# Patient Record
Sex: Female | Born: 1960 | Race: White | Hispanic: No | Marital: Single | Smoking: Former smoker
Health system: Southern US, Community
[De-identification: ages and names within clinical notes are randomized; demographics above are authoritative.]

## PROBLEM LIST (undated history)

## (undated) DIAGNOSIS — M65311 Trigger thumb, right thumb: Secondary | ICD-10-CM

## (undated) DIAGNOSIS — Z8489 Family history of other specified conditions: Secondary | ICD-10-CM

## (undated) DIAGNOSIS — F329 Major depressive disorder, single episode, unspecified: Secondary | ICD-10-CM

## (undated) DIAGNOSIS — K421 Umbilical hernia with gangrene: Secondary | ICD-10-CM

## (undated) DIAGNOSIS — G4730 Sleep apnea, unspecified: Secondary | ICD-10-CM

## (undated) DIAGNOSIS — I10 Essential (primary) hypertension: Secondary | ICD-10-CM

## (undated) DIAGNOSIS — F419 Anxiety disorder, unspecified: Secondary | ICD-10-CM

## (undated) DIAGNOSIS — K219 Gastro-esophageal reflux disease without esophagitis: Secondary | ICD-10-CM

## (undated) DIAGNOSIS — M65312 Trigger thumb, left thumb: Secondary | ICD-10-CM

## (undated) DIAGNOSIS — G629 Polyneuropathy, unspecified: Secondary | ICD-10-CM

## (undated) DIAGNOSIS — K760 Fatty (change of) liver, not elsewhere classified: Secondary | ICD-10-CM

## (undated) DIAGNOSIS — E119 Type 2 diabetes mellitus without complications: Secondary | ICD-10-CM

## (undated) DIAGNOSIS — F32A Depression, unspecified: Secondary | ICD-10-CM

## (undated) DIAGNOSIS — E7800 Pure hypercholesterolemia, unspecified: Secondary | ICD-10-CM

## (undated) DIAGNOSIS — D649 Anemia, unspecified: Secondary | ICD-10-CM

## (undated) DIAGNOSIS — Z8719 Personal history of other diseases of the digestive system: Secondary | ICD-10-CM

## (undated) HISTORY — PX: COLON SURGERY: SHX602

## (undated) HISTORY — PX: KNEE ARTHROSCOPY: SHX127

## (undated) HISTORY — PX: COLONOSCOPY: SHX174

## (undated) HISTORY — PX: OTHER SURGICAL HISTORY: SHX169

## (undated) HISTORY — PX: HERNIA REPAIR: SHX51

---

## 1998-10-30 ENCOUNTER — Emergency Department (HOSPITAL_COMMUNITY): Admission: EM | Admit: 1998-10-30 | Discharge: 1998-10-30 | Payer: Self-pay | Admitting: Emergency Medicine

## 1998-10-30 ENCOUNTER — Encounter: Payer: Self-pay | Admitting: Emergency Medicine

## 1999-07-03 ENCOUNTER — Encounter: Admission: RE | Admit: 1999-07-03 | Discharge: 1999-07-03 | Payer: Self-pay | Admitting: Family Medicine

## 1999-07-03 ENCOUNTER — Encounter: Payer: Self-pay | Admitting: Family Medicine

## 2000-03-11 ENCOUNTER — Other Ambulatory Visit: Admission: RE | Admit: 2000-03-11 | Discharge: 2000-03-11 | Payer: Self-pay | Admitting: Obstetrics and Gynecology

## 2001-05-29 ENCOUNTER — Encounter: Admission: RE | Admit: 2001-05-29 | Discharge: 2001-05-29 | Payer: Self-pay | Admitting: *Deleted

## 2001-06-02 ENCOUNTER — Encounter: Admission: RE | Admit: 2001-06-02 | Discharge: 2001-06-02 | Payer: Self-pay | Admitting: *Deleted

## 2001-06-05 ENCOUNTER — Encounter: Admission: RE | Admit: 2001-06-05 | Discharge: 2001-06-05 | Payer: Self-pay | Admitting: *Deleted

## 2001-06-10 ENCOUNTER — Encounter: Admission: RE | Admit: 2001-06-10 | Discharge: 2001-06-10 | Payer: Self-pay | Admitting: *Deleted

## 2001-06-17 ENCOUNTER — Encounter: Admission: RE | Admit: 2001-06-17 | Discharge: 2001-06-17 | Payer: Self-pay | Admitting: *Deleted

## 2001-07-01 ENCOUNTER — Encounter: Admission: RE | Admit: 2001-07-01 | Discharge: 2001-07-01 | Payer: Self-pay | Admitting: *Deleted

## 2001-11-13 ENCOUNTER — Encounter: Admission: RE | Admit: 2001-11-13 | Discharge: 2001-11-13 | Payer: Self-pay | Admitting: *Deleted

## 2003-02-23 ENCOUNTER — Other Ambulatory Visit: Admission: RE | Admit: 2003-02-23 | Discharge: 2003-02-23 | Payer: Self-pay | Admitting: Obstetrics and Gynecology

## 2004-07-19 ENCOUNTER — Ambulatory Visit (HOSPITAL_COMMUNITY): Admission: RE | Admit: 2004-07-19 | Discharge: 2004-07-19 | Payer: Self-pay | Admitting: Obstetrics and Gynecology

## 2006-08-01 ENCOUNTER — Encounter: Admission: RE | Admit: 2006-08-01 | Discharge: 2006-08-01 | Payer: Self-pay | Admitting: Obstetrics and Gynecology

## 2009-09-22 ENCOUNTER — Encounter: Admission: RE | Admit: 2009-09-22 | Discharge: 2009-09-22 | Payer: Self-pay | Admitting: Family Medicine

## 2013-01-31 ENCOUNTER — Encounter (HOSPITAL_COMMUNITY): Payer: Self-pay | Admitting: Emergency Medicine

## 2013-01-31 ENCOUNTER — Emergency Department (HOSPITAL_COMMUNITY): Payer: BC Managed Care – PPO | Admitting: Certified Registered Nurse Anesthetist

## 2013-01-31 ENCOUNTER — Encounter (HOSPITAL_COMMUNITY): Admission: EM | Disposition: A | Discharge status: Home Health | Payer: Self-pay | Source: Home / Self Care

## 2013-01-31 ENCOUNTER — Emergency Department (INDEPENDENT_AMBULATORY_CARE_PROVIDER_SITE_OTHER)
Admission: EM | Admit: 2013-01-31 | Discharge: 2013-01-31 | Disposition: A | Discharge status: Nursing Home (Includes ICF) | Payer: BC Managed Care – PPO | Source: Home / Self Care | Attending: Family Medicine | Admitting: Family Medicine

## 2013-01-31 ENCOUNTER — Emergency Department (HOSPITAL_COMMUNITY): Payer: BC Managed Care – PPO

## 2013-01-31 ENCOUNTER — Inpatient Hospital Stay (HOSPITAL_COMMUNITY)
Admission: EM | Admit: 2013-01-31 | Discharge: 2013-02-08 | DRG: 330 | Disposition: A | Discharge status: Home Health | Payer: BC Managed Care – PPO | Attending: Surgery | Admitting: Surgery

## 2013-01-31 ENCOUNTER — Encounter (HOSPITAL_COMMUNITY): Payer: BC Managed Care – PPO | Admitting: Certified Registered Nurse Anesthetist

## 2013-01-31 DIAGNOSIS — E785 Hyperlipidemia, unspecified: Secondary | ICD-10-CM | POA: Diagnosis present

## 2013-01-31 DIAGNOSIS — K420 Umbilical hernia with obstruction, without gangrene: Secondary | ICD-10-CM

## 2013-01-31 DIAGNOSIS — K56609 Unspecified intestinal obstruction, unspecified as to partial versus complete obstruction: Secondary | ICD-10-CM

## 2013-01-31 DIAGNOSIS — R109 Unspecified abdominal pain: Secondary | ICD-10-CM

## 2013-01-31 DIAGNOSIS — T8140XA Infection following a procedure, unspecified, initial encounter: Secondary | ICD-10-CM | POA: Diagnosis not present

## 2013-01-31 DIAGNOSIS — E7800 Pure hypercholesterolemia, unspecified: Secondary | ICD-10-CM | POA: Diagnosis present

## 2013-01-31 DIAGNOSIS — Z6841 Body Mass Index (BMI) 40.0 and over, adult: Secondary | ICD-10-CM

## 2013-01-31 DIAGNOSIS — T8149XA Infection following a procedure, other surgical site, initial encounter: Secondary | ICD-10-CM

## 2013-01-31 DIAGNOSIS — R1110 Vomiting, unspecified: Secondary | ICD-10-CM

## 2013-01-31 DIAGNOSIS — E119 Type 2 diabetes mellitus without complications: Secondary | ICD-10-CM | POA: Diagnosis present

## 2013-01-31 DIAGNOSIS — K421 Umbilical hernia with gangrene: Secondary | ICD-10-CM | POA: Diagnosis present

## 2013-01-31 DIAGNOSIS — E872 Acidosis, unspecified: Secondary | ICD-10-CM | POA: Diagnosis present

## 2013-01-31 DIAGNOSIS — I10 Essential (primary) hypertension: Secondary | ICD-10-CM | POA: Diagnosis present

## 2013-01-31 DIAGNOSIS — D72829 Elevated white blood cell count, unspecified: Secondary | ICD-10-CM

## 2013-01-31 HISTORY — DX: Umbilical hernia with gangrene: K42.1

## 2013-01-31 HISTORY — DX: Type 2 diabetes mellitus without complications: E11.9

## 2013-01-31 HISTORY — PX: BOWEL RESECTION: SHX1257

## 2013-01-31 HISTORY — DX: Pure hypercholesterolemia, unspecified: E78.00

## 2013-01-31 HISTORY — PX: UMBILICAL HERNIA REPAIR: SHX196

## 2013-01-31 HISTORY — DX: Essential (primary) hypertension: I10

## 2013-01-31 LAB — CBC WITH DIFFERENTIAL/PLATELET
Basophils Absolute: 0 K/uL (ref 0.0–0.1)
Basophils Relative: 0 % (ref 0–1)
Eosinophils Absolute: 0.1 10*3/uL (ref 0.0–0.7)
Eosinophils Relative: 1 % (ref 0–5)
HCT: 41 % (ref 36.0–46.0)
Hemoglobin: 13.9 g/dL (ref 12.0–15.0)
Lymphocytes Relative: 10 % — ABNORMAL LOW (ref 12–46)
Lymphs Abs: 1.6 10*3/uL (ref 0.7–4.0)
MCH: 29.8 pg (ref 26.0–34.0)
MCHC: 33.9 g/dL (ref 30.0–36.0)
MCV: 88 fL (ref 78.0–100.0)
Monocytes Absolute: 0.6 10*3/uL (ref 0.1–1.0)
Monocytes Relative: 4 % (ref 3–12)
Neutro Abs: 13.6 K/uL — ABNORMAL HIGH (ref 1.7–7.7)
Neutrophils Relative %: 85 % — ABNORMAL HIGH (ref 43–77)
Platelets: 398 K/uL (ref 150–400)
RBC: 4.66 MIL/uL (ref 3.87–5.11)
RDW: 13.4 % (ref 11.5–15.5)
WBC: 16 K/uL — ABNORMAL HIGH (ref 4.0–10.5)

## 2013-01-31 LAB — URINALYSIS, ROUTINE W REFLEX MICROSCOPIC
Glucose, UA: 100 mg/dL — AB
Hgb urine dipstick: NEGATIVE
Ketones, ur: 15 mg/dL — AB
Leukocytes, UA: NEGATIVE
Nitrite: NEGATIVE
Protein, ur: NEGATIVE mg/dL
Specific Gravity, Urine: 1.044 — ABNORMAL HIGH (ref 1.005–1.030)
Urobilinogen, UA: 0.2 mg/dL (ref 0.0–1.0)
pH: 5 (ref 5.0–8.0)

## 2013-01-31 LAB — CG4 I-STAT (LACTIC ACID): Lactic Acid, Venous: 2.95 mmol/L — ABNORMAL HIGH (ref 0.5–2.2)

## 2013-01-31 LAB — COMPREHENSIVE METABOLIC PANEL WITH GFR
ALT: 22 U/L (ref 0–35)
AST: 20 U/L (ref 0–37)
Albumin: 4 g/dL (ref 3.5–5.2)
Alkaline Phosphatase: 49 U/L (ref 39–117)
Chloride: 96 meq/L (ref 96–112)
Creatinine, Ser: 0.59 mg/dL (ref 0.50–1.10)
Potassium: 4 meq/L (ref 3.5–5.1)
Sodium: 134 meq/L — ABNORMAL LOW (ref 135–145)
Total Bilirubin: 0.6 mg/dL (ref 0.3–1.2)

## 2013-01-31 LAB — COMPREHENSIVE METABOLIC PANEL
BUN: 10 mg/dL (ref 6–23)
CO2: 25 mEq/L (ref 19–32)
Calcium: 9.7 mg/dL (ref 8.4–10.5)
GFR calc Af Amer: >90 mL/min (ref >90)
GFR calc non Af Amer: >90 mL/min (ref >90)
Glucose, Bld: 202 mg/dL — ABNORMAL HIGH (ref 70–99)
Total Protein: 8.4 g/dL — ABNORMAL HIGH (ref 6.0–8.3)

## 2013-01-31 LAB — LIPASE, BLOOD: Lipase: 17 U/L (ref 11–59)

## 2013-01-31 SURGERY — REPAIR, HERNIA, UMBILICAL, ADULT
Anesthesia: General | Site: Abdomen | Wound class: Clean Contaminated

## 2013-01-31 MED ORDER — FENTANYL CITRATE 0.05 MG/ML IJ SOLN
INTRAMUSCULAR | Status: DC | PRN
  Administered 2013-01-31 – 2013-02-01 (×7): 50 ug via INTRAVENOUS

## 2013-01-31 MED ORDER — HYDROMORPHONE HCL PF 1 MG/ML IJ SOLN
2.0000 mg | Freq: Once | INTRAMUSCULAR | Status: AC
  Administered 2013-01-31: 2 mg via INTRAMUSCULAR

## 2013-01-31 MED ORDER — IOHEXOL 300 MG/ML  SOLN
20.0000 mL | INTRAMUSCULAR | Status: AC
  Administered 2013-01-31: 25 mL via ORAL
  Administered 2013-01-31: 20 mL via ORAL

## 2013-01-31 MED ORDER — ONDANSETRON 4 MG PO TBDP
ORAL_TABLET | ORAL | Status: AC
  Filled 2013-01-31: qty 1

## 2013-01-31 MED ORDER — CEFAZOLIN SODIUM 1-5 GM-% IV SOLN
INTRAVENOUS | Status: AC
  Filled 2013-01-31: qty 100

## 2013-01-31 MED ORDER — SODIUM CHLORIDE 0.9 % IV BOLUS (SEPSIS)
1000.0000 mL | Freq: Once | INTRAVENOUS | Status: AC
  Administered 2013-01-31: 1000 mL via INTRAVENOUS

## 2013-01-31 MED ORDER — ONDANSETRON 4 MG PO TBDP
4.0000 mg | ORAL_TABLET | Freq: Once | ORAL | Status: AC
  Administered 2013-01-31: 4 mg via ORAL

## 2013-01-31 MED ORDER — HYDROMORPHONE HCL PF 1 MG/ML IJ SOLN
1.0000 mg | Freq: Once | INTRAMUSCULAR | Status: AC
  Administered 2013-01-31: 1 mg via INTRAVENOUS
  Filled 2013-01-31: qty 1

## 2013-01-31 MED ORDER — SODIUM CHLORIDE 0.9 % IV BOLUS (SEPSIS): 1000.0000 mL | Freq: Once | INTRAVENOUS | Status: DC

## 2013-01-31 MED ORDER — HYDROMORPHONE HCL PF 1 MG/ML IJ SOLN
INTRAMUSCULAR | Status: AC
  Filled 2013-01-31: qty 2

## 2013-01-31 MED ORDER — CEFAZOLIN SODIUM 1-5 GM-% IV SOLN
1.0000 g | Freq: Once | INTRAVENOUS | Status: DC
  Filled 2013-01-31: qty 50

## 2013-01-31 MED ORDER — SODIUM CHLORIDE 0.9 % IV SOLN
INTRAVENOUS | Status: DC | PRN
  Administered 2013-01-31: via INTRAVENOUS

## 2013-01-31 MED ORDER — MIDAZOLAM HCL 5 MG/5ML IJ SOLN
INTRAMUSCULAR | Status: DC | PRN
  Administered 2013-01-31: 2 mg via INTRAVENOUS

## 2013-01-31 MED ORDER — IOHEXOL 300 MG/ML  SOLN
100.0000 mL | Freq: Once | INTRAMUSCULAR | Status: AC | PRN
  Administered 2013-01-31: 100 mL via INTRAVENOUS

## 2013-01-31 SURGICAL SUPPLY — 51 items
BLADE SURG 10 STRL SS (BLADE) ×2 IMPLANT
BLADE SURG 15 STRL LF DISP TIS (BLADE) ×1 IMPLANT
BLADE SURG 15 STRL SS (BLADE) ×1
CHLORAPREP W/TINT 26ML (MISCELLANEOUS) ×2 IMPLANT
COTTONBALL LRG STERILE PKG (GAUZE/BANDAGES/DRESSINGS) IMPLANT
COVER SURGICAL LIGHT HANDLE (MISCELLANEOUS) ×2 IMPLANT
DECANTER SPIKE VIAL GLASS SM (MISCELLANEOUS) ×2 IMPLANT
DERMABOND ADVANCED (GAUZE/BANDAGES/DRESSINGS) ×1
DERMABOND ADVANCED .7 DNX12 (GAUZE/BANDAGES/DRESSINGS) ×1 IMPLANT
DRAPE LAPAROSCOPIC ABDOMINAL (DRAPES) ×2 IMPLANT
DRAPE UTILITY 15X26 W/TAPE STR (DRAPE) ×4 IMPLANT
DRSG OPSITE POSTOP 4X6 (GAUZE/BANDAGES/DRESSINGS) ×2 IMPLANT
ELECT CAUTERY BLADE 6.4 (BLADE) ×4 IMPLANT
ELECT REM PT RETURN 9FT ADLT (ELECTROSURGICAL) ×2
ELECTRODE REM PT RTRN 9FT ADLT (ELECTROSURGICAL) ×1 IMPLANT
GLOVE BIO SURGEON STRL SZ7 (GLOVE) ×2 IMPLANT
GLOVE BIO SURGEON STRL SZ7.5 (GLOVE) ×4 IMPLANT
GLOVE BIO SURGEON STRL SZ8 (GLOVE) ×2 IMPLANT
GLOVE BIOGEL PI IND STRL 7.5 (GLOVE) ×2 IMPLANT
GLOVE BIOGEL PI INDICATOR 7.5 (GLOVE) ×2
GOWN STRL NON-REIN LRG LVL3 (GOWN DISPOSABLE) ×6 IMPLANT
KIT BASIN OR (CUSTOM PROCEDURE TRAY) ×2 IMPLANT
KIT ROOM TURNOVER OR (KITS) ×2 IMPLANT
NEEDLE HYPO 25GX1X1/2 BEV (NEEDLE) ×2 IMPLANT
NS IRRIG 1000ML POUR BTL (IV SOLUTION) ×2 IMPLANT
PACK SURGICAL SETUP 50X90 (CUSTOM PROCEDURE TRAY) ×2 IMPLANT
PAD ARMBOARD 7.5X6 YLW CONV (MISCELLANEOUS) ×4 IMPLANT
PENCIL BUTTON HOLSTER BLD 10FT (ELECTRODE) ×2 IMPLANT
RELOAD PROXIMATE 75MM BLUE (ENDOMECHANICALS) ×4 IMPLANT
SPONGE GAUZE 4X4 12PLY (GAUZE/BANDAGES/DRESSINGS) IMPLANT
SPONGE LAP 18X18 X RAY DECT (DISPOSABLE) ×2 IMPLANT
STAPLER GUN LINEAR PROX 60 (STAPLE) ×2 IMPLANT
STAPLER PROXIMATE 75MM BLUE (STAPLE) ×2 IMPLANT
STAPLER VISISTAT 35W (STAPLE) ×2 IMPLANT
SUCTION POOLE TIP (SUCTIONS) ×2 IMPLANT
SUT MNCRL AB 4-0 PS2 18 (SUTURE) ×2 IMPLANT
SUT NOVA NAB DX-16 0-1 5-0 T12 (SUTURE) ×4 IMPLANT
SUT PROLENE 0 CT 1 CR/8 (SUTURE) IMPLANT
SUT SILK 2 0 (SUTURE) ×1
SUT SILK 2 0 SH CR/8 (SUTURE) ×4 IMPLANT
SUT SILK 2-0 18XBRD TIE 12 (SUTURE) ×1 IMPLANT
SUT VIC AB 2-0 SH 27 (SUTURE) ×2
SUT VIC AB 2-0 SH 27X BRD (SUTURE) ×2 IMPLANT
SUT VIC AB 3-0 SH 27 (SUTURE) ×1
SUT VIC AB 3-0 SH 27XBRD (SUTURE) ×1 IMPLANT
SYR BULB 3OZ (MISCELLANEOUS) ×2 IMPLANT
SYR CONTROL 10ML LL (SYRINGE) ×2 IMPLANT
TOWEL OR 17X24 6PK STRL BLUE (TOWEL DISPOSABLE) ×2 IMPLANT
TOWEL OR 17X26 10 PK STRL BLUE (TOWEL DISPOSABLE) ×2 IMPLANT
TUBE CONNECTING 12X1/4 (SUCTIONS) ×2 IMPLANT
YANKAUER SUCT BULB TIP NO VENT (SUCTIONS) ×2 IMPLANT

## 2013-01-31 NOTE — ED Notes (Signed)
Pt  Remains  Npo      Sister  At  Bedside

## 2013-01-31 NOTE — ED Notes (Signed)
Patient returned from CT

## 2013-01-31 NOTE — ED Notes (Signed)
Physician speaking with patient re: test results.

## 2013-01-31 NOTE — ED Notes (Signed)
Patient asked for urine sample, she states she is unable to give sample at this time.

## 2013-01-31 NOTE — ED Notes (Signed)
Pt presents to department for evaluation of abdominal pain and N/V. Onset of symptoms yesterday. Also states several episodes of nausea/vomiting. 5/10 pain upon arrival to ED. Denies urinary symptoms. Pt is alert and oriented x4.

## 2013-01-31 NOTE — ED Notes (Signed)
Pt  Reports   Abdominal  Pain  With  Nausea   /  Vomiting   -  No  Diarrhea   -  Symptoms  Began  Yesterday  Pt   Appears  In pain

## 2013-01-31 NOTE — H&P (Signed)
Lauren Schmidt is an 52 y.o. female.   Chief Complaint: abdominal pain HPI: The pt is a 52 yo wf who began having abdominal pain yesterday. Pain has worsened. She started having nausea and vomiting today. She denies any fever or chill. She came to ER where CT shows an incarcerated umbilical hernia causing a bowel obstruction.  Past Medical History  Diagnosis Date  . Diabetes mellitus without complication   . Hypertension   . Hypercholesterolemia     Past Surgical History  Procedure Laterality Date  . Knee surgery      No family history on file. Social History:  reports that she has never smoked. She does not have any smokeless tobacco history on file. She reports that she does not drink alcohol. Her drug history is not on file.  Allergies: No Known Allergies   (Not in a hospital admission)  Results for orders placed during the hospital encounter of 01/31/13 (from the past 48 hour(s))  CBC WITH DIFFERENTIAL     Status: Abnormal   Collection Time    01/31/13  4:03 PM      Result Value Range   WBC 16.0 (*) 4.0 - 10.5 K/uL   RBC 4.66  3.87 - 5.11 MIL/uL   Hemoglobin 13.9  12.0 - 15.0 g/dL   HCT 16.1  09.6 - 04.5 %   MCV 88.0  78.0 - 100.0 fL   MCH 29.8  26.0 - 34.0 pg   MCHC 33.9  30.0 - 36.0 g/dL   RDW 40.9  81.1 - 91.4 %   Platelets 398  150 - 400 K/uL   Neutrophils Relative % 85 (*) 43 - 77 %   Neutro Abs 13.6 (*) 1.7 - 7.7 K/uL   Lymphocytes Relative 10 (*) 12 - 46 %   Lymphs Abs 1.6  0.7 - 4.0 K/uL   Monocytes Relative 4  3 - 12 %   Monocytes Absolute 0.6  0.1 - 1.0 K/uL   Eosinophils Relative 1  0 - 5 %   Eosinophils Absolute 0.1  0.0 - 0.7 K/uL   Basophils Relative 0  0 - 1 %   Basophils Absolute 0.0  0.0 - 0.1 K/uL  COMPREHENSIVE METABOLIC PANEL     Status: Abnormal   Collection Time    01/31/13  4:03 PM      Result Value Range   Sodium 134 (*) 135 - 145 mEq/L   Potassium 4.0  3.5 - 5.1 mEq/L   Chloride 96  96 - 112 mEq/L   CO2 25  19 - 32 mEq/L   Glucose,  Bld 202 (*) 70 - 99 mg/dL   BUN 10  6 - 23 mg/dL   Creatinine, Ser 7.82  0.50 - 1.10 mg/dL   Calcium 9.7  8.4 - 95.6 mg/dL   Total Protein 8.4 (*) 6.0 - 8.3 g/dL   Albumin 4.0  3.5 - 5.2 g/dL   AST 20  0 - 37 U/L   ALT 22  0 - 35 U/L   Alkaline Phosphatase 49  39 - 117 U/L   Total Bilirubin 0.6  0.3 - 1.2 mg/dL   GFR calc non Af Amer >90  >90 mL/min   GFR calc Af Amer >90  >90 mL/min   Comment: (NOTE)     The eGFR has been calculated using the CKD EPI equation.     This calculation has not been validated in all clinical situations.     eGFR's persistently <90 mL/min  signify possible Chronic Kidney     Disease.  LIPASE, BLOOD     Status: None   Collection Time    01/31/13  4:03 PM      Result Value Range   Lipase 17  11 - 59 U/L  URINALYSIS, ROUTINE W REFLEX MICROSCOPIC     Status: Abnormal   Collection Time    01/31/13  9:40 PM      Result Value Range   Color, Urine AMBER (*) YELLOW   Comment: BIOCHEMICALS MAY BE AFFECTED BY COLOR   APPearance CLEAR  CLEAR   Specific Gravity, Urine 1.044 (*) 1.005 - 1.030   pH 5.0  5.0 - 8.0   Glucose, UA 100 (*) NEGATIVE mg/dL   Hgb urine dipstick NEGATIVE  NEGATIVE   Bilirubin Urine SMALL (*) NEGATIVE   Ketones, ur 15 (*) NEGATIVE mg/dL   Protein, ur NEGATIVE  NEGATIVE mg/dL   Urobilinogen, UA 0.2  0.0 - 1.0 mg/dL   Nitrite NEGATIVE  NEGATIVE   Leukocytes, UA NEGATIVE  NEGATIVE   Comment: MICROSCOPIC NOT DONE ON URINES WITH NEGATIVE PROTEIN, BLOOD, LEUKOCYTES, NITRITE, OR GLUCOSE <1000 mg/dL.  CG4 I-STAT (LACTIC ACID)     Status: Abnormal   Collection Time    01/31/13 10:22 PM      Result Value Range   Lactic Acid, Venous 2.95 (*) 0.5 - 2.2 mmol/L   Ct Abdomen Pelvis W Contrast  01/31/2013   CLINICAL DATA:  Abdominal pain, vomiting.  EXAM: CT ABDOMEN AND PELVIS WITH CONTRAST  TECHNIQUE: Multidetector CT imaging of the abdomen and pelvis was performed using the standard protocol following bolus administration of intravenous contrast.   CONTRAST:  OMNIPAQUE IOHEXOL 300 MG/ML  SOLN  COMPARISON:  None.  FINDINGS: Mild dependent atelectasis in the visualized lung bases. Unremarkable liver, nondilated gallbladder, spleen, adrenal glands, pancreas, left kidney. 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 No hydronephrosis or nephrolithiasis. Unremarkable aorta. Stomach is physiologically distended.  Multiple distended proximal small bowel loops. There is an umbilical hernia containing a loop of small bowel which appears to represent a transition point to decompressed distal small bowel. There is a small amount of extraluminal fluid around the loops in the umbilical hernia suggesting incarceration or strangulation. The colon is nondilated. There is a small amount of pelvic ascites. . No free air. Urinary bladder incompletely distended. Uterus and adnexal regions unremarkable. Portal vein patent. No adenopathy localized. Facet degenerative changes in the lower lumbar spine.  IMPRESSION: 1. Small bowel obstruction secondary to involvement in a small umbilical hernia. 2. Small amount of pelvic ascites.   Electronically Signed   By: Oley Balm M.D.   On: 01/31/2013 21:30    Review of Systems  Constitutional: Negative.   HENT: Negative.   Eyes: Negative.   Respiratory: Negative.   Cardiovascular: Negative.   Gastrointestinal: Positive for nausea, vomiting and abdominal pain.  Genitourinary: Negative.   Musculoskeletal: Negative.   Skin: Negative.   Neurological: Negative.   Endo/Heme/Allergies: Negative.   Psychiatric/Behavioral: Negative.     Blood pressure 142/81, pulse 103, temperature 98.3 F (36.8 C), temperature source Oral, resp. rate 20, SpO2 97.00%. Physical Exam  Constitutional: She is oriented to person, place, and time. She appears well-developed and well-nourished.  [name]  HENT:  Head: Normocephalic and atraumatic.  Eyes: Conjunctivae and EOM are normal.  Pupils are equal, round, and reactive to light.  Neck: Normal range of motion. Neck supple.  Cardiovascular:  Normal rate, regular rhythm and normal heart sounds.   Respiratory: Effort normal and breath sounds normal.  GI: Soft.  nonreducible hernia at umbilicus. Tender to palpation  Musculoskeletal: Normal range of motion.  Neurological: She is alert and oriented to person, place, and time.  Skin: Skin is warm and dry.  Psychiatric: She has a normal mood and affect. Her behavior is normal.     Assessment/Plan The pt has an incarcerated umbilical hernia causing a bowel obstruction. Because of the risk of strangulation of the bowel she will need urgent exploration to fix the hernia and possible bowel resection. I have discussed with her the risks and benefits of surgery as well as some of the technical aspects including the risk of bowel resection and leak and she understands and wishes to proceed.  TOTH III,Emmalou Hunger S 01/31/2013, 10:56 PM

## 2013-01-31 NOTE — ED Provider Notes (Signed)
CSN: [id number]     Arrival date & time 01/31/13  1351 History   First MD Initiated Contact with Patient 01/31/13 1453     Chief Complaint  Patient presents with  . Abdominal Pain   (Consider location/radiation/quality/duration/timing/severity/associated sxs/prior Treatment) Patient is a 52 y.o. female presenting with abdominal pain. The history is provided by the patient.  Abdominal Pain Associated symptoms include abdominal pain. Nothing aggravates the symptoms. Nothing relieves the symptoms. She has tried nothing for the symptoms.  Pt complains of severe left lower abdominal pain  Past Medical History  Diagnosis Date  . Diabetes mellitus without complication   . Hypertension   . Hypercholesterolemia    Past Surgical History  Procedure Laterality Date  . Knee surgery     History reviewed. No pertinent family history. History  Substance Use Topics  . Smoking status: Never Smoker   . Smokeless tobacco: Not on file  . Alcohol Use: No   OB History   Grav Para Term Preterm Abortions TAB SAB Ect Mult Living                 Review of Systems  Gastrointestinal: Positive for abdominal pain.  All other systems reviewed and are negative.    Allergies  Review of patient's allergies indicates not on file.  Home Medications   Current Outpatient Rx  Name  Route  Sig  Dispense  Refill  . ATENOLOL PO   Oral   Take by mouth.         . Fenofibrate (TRICOR PO)   Oral   Take by mouth.         . METFORMIN HCL PO   Oral   Take by mouth.         . omega-3 acid ethyl esters (LOVAZA) 1 G capsule   Oral   Take 2 g by mouth 2 (two) times daily.          BP 150/68  Pulse 92  Temp(Src) 98.5 F (36.9 C) (Oral)  Resp 16  SpO2 99% Physical Exam  Nursing note and vitals reviewed. Constitutional: She is oriented to person, place, and time. She appears well-developed and well-nourished.  HENT:  Head: Normocephalic.  Eyes: EOM are normal.  Neck: Normal range of  motion.  Pulmonary/Chest: Effort normal.  Abdominal: Soft. She exhibits no distension. There is tenderness. There is guarding.  Left lower abdomen  Musculoskeletal: Normal range of motion.  Neurological: She is alert and oriented to person, place, and time.  Psychiatric: She has a normal mood and affect.    ED Course  Procedures (including critical care time) Labs Review Labs Reviewed - No data to display Imaging Review No results found.  EKG Interpretation     Ventricular Rate:    PR Interval:    QRS Duration:   QT Interval:    QTC Calculation:   R Axis:     Text Interpretation:              MDM   1. Abdominal  pain, other specified site    Pt to ED for evaluation    Elson Areas, PA-C 01/31/13 1500

## 2013-01-31 NOTE — Anesthesia Preprocedure Evaluation (Addendum)
Anesthesia Evaluation  Patient identified by MRN, date of birth, ID band Patient awake    Reviewed: Allergy & Precautions, H&P , NPO status , Patient's Chart, lab work & pertinent test results  Airway Mallampati: III TM Distance: <3 FB Neck ROM: Full    Dental  (+) Teeth Intact and Dental Advisory Given   Pulmonary  breath sounds clear to auscultation        Cardiovascular hypertension, Pt. on home beta blockers Rhythm:Regular Rate:Tachycardia     Neuro/Psych    GI/Hepatic Small bowel obstruction   Endo/Other  diabetes, Type 2, Oral Hypoglycemic AgentsMorbid obesity  Renal/GU      Musculoskeletal   Abdominal (+) + obese,  Abdomen: tender.    Peds  Hematology   Anesthesia Other Findings   Reproductive/Obstetrics                         Anesthesia Physical Anesthesia Plan  ASA: III and emergent  Anesthesia Plan: General   Post-op Pain Management:    Induction: Intravenous, Rapid sequence and Cricoid pressure planned  Airway Management Planned: Oral ETT and Video Laryngoscope Planned  Additional Equipment:   Intra-op Plan:   Post-operative Plan: Extubation in OR and Possible Post-op intubation/ventilation  Informed Consent: I have reviewed the patients History and Physical, chart, labs and discussed the procedure including the risks, benefits and alternatives for the proposed anesthesia with the patient or authorized representative who has indicated his/her understanding and acceptance.     Plan Discussed with: CRNA and Surgeon  Anesthesia Plan Comments:         Anesthesia Quick Evaluation

## 2013-01-31 NOTE — ED Provider Notes (Signed)
CSN: [id number]     Arrival date & time 01/31/13  1539 History   First MD Initiated Contact with Patient 01/31/13 1838     Chief Complaint  Patient presents with  . Abdominal Pain  . Emesis    The history is provided by the patient. No language interpreter was used.    Ms. Condron is a 52 y.o. Caucasian female with past medical history of hypertension hyperlipidemia and diabetes who comes emergency department today with abdominal pain. The pain began yesterday afternoon she initially rated at a 5/10 however over the past 24 hours it has become progressively worse.  She currently rates the pain at a 10 out of 10. She's not been able to eat anything for the pain as severe. She denies any distention. Has began vomiting today. With one episode of vomiting after she ate this morning. She initially went to urgent care facility which was his abdominal exam had some significant tenderness and guarding assaulters transferred to the emergency department for further evaluation. She's never had abdominal surgery. She denies fevers, chills, diarrhea, constipation, dysuria, frequency, urgency, or hematuria. She denies vaginal bleeding or vaginal discharge.   Past Medical History  Diagnosis Date  . Diabetes mellitus without complication   . Hypertension   . Hypercholesterolemia    Past Surgical History  Procedure Laterality Date  . Knee surgery     No family history on file. History  Substance Use Topics  . Smoking status: Never Smoker   . Smokeless tobacco: Not on file  . Alcohol Use: No   OB History   Grav Para Term Preterm Abortions TAB SAB Ect Mult Living                 Review of Systems  Constitutional: Negative for fever and chills.  Respiratory: Negative for cough and shortness of breath.   Gastrointestinal: Positive for nausea, vomiting and abdominal pain. Negative for diarrhea, constipation and abdominal distention.  Genitourinary: Negative for dysuria, urgency and frequency.   Musculoskeletal: Negative for back pain, joint swelling and neck pain.  Psychiatric/Behavioral: Negative for confusion.  All other systems reviewed and are negative.    Allergies  Review of patient's allergies indicates no known allergies.  Home Medications   Current Outpatient Rx  Name  Route  Sig  Dispense  Refill  . ATENOLOL PO   Oral   Take by mouth.         . Fenofibrate (TRICOR PO)   Oral   Take by mouth.         Marland Kitchen GLIMEPIRIDE PO   Oral   Take 1 tablet by mouth daily.         Marland Kitchen LISINOPRIL PO   Oral   Take 1 tablet by mouth daily.         Marland Kitchen METFORMIN HCL PO   Oral   Take by mouth.         . omega-3 acid ethyl esters (LOVAZA) 1 G capsule   Oral   Take 2 g by mouth 2 (two) times daily.         Marland Kitchen SIMVASTATIN PO   Oral   Take 1 tablet by mouth daily.          BP 142/81  Pulse 103  Temp(Src) 98.3 F (36.8 C) (Oral)  Resp 20  SpO2 97% Physical Exam  Nursing note and vitals reviewed. Constitutional: She is oriented to person, place, and time. She appears well-developed and well-nourished. No distress.  HENT:  Head: Normocephalic and atraumatic.  Eyes: Pupils are equal, round, and reactive to light.  Neck: Normal range of motion.  Cardiovascular: Normal rate, regular rhythm, normal heart sounds and intact distal pulses.   Pulmonary/Chest: Effort normal. No respiratory distress. She has no wheezes. She exhibits no tenderness.  Abdominal: Soft. Bowel sounds are normal. She exhibits no distension. There is generalized tenderness. There is guarding. There is no rigidity, no rebound, no CVA tenderness, no tenderness at McBurney's point and negative Murphy's sign.  Neurological: She is alert and oriented to person, place, and time. She has normal strength. No cranial nerve deficit or sensory deficit. She exhibits normal muscle tone. Coordination and gait normal.  Skin: Skin is warm and dry.    ED Course  Procedures (including critical care  time) Labs Review Labs Reviewed  URINALYSIS, ROUTINE W REFLEX MICROSCOPIC - Abnormal; Notable for the following:    Color, Urine AMBER (*)    Specific Gravity, Urine 1.044 (*)    Glucose, UA 100 (*)    Bilirubin Urine SMALL (*)    Ketones, ur 15 (*)    All other components within normal limits  CBC WITH DIFFERENTIAL - Abnormal; Notable for the following:    WBC 16.0 (*)    Neutrophils Relative % 85 (*)    Neutro Abs 13.6 (*)    Lymphocytes Relative 10 (*)    All other components within normal limits  COMPREHENSIVE METABOLIC PANEL - Abnormal; Notable for the following:    Sodium 134 (*)    Glucose, Bld 202 (*)    Total Protein 8.4 (*)    All other components within normal limits  CG4 I-STAT (LACTIC ACID) - Abnormal; Notable for the following:    Lactic Acid, Venous 2.95 (*)    All other components within normal limits  LIPASE, BLOOD   Imaging Review Ct Abdomen Pelvis W Contrast  01/31/2013   CLINICAL DATA:  Abdominal pain, vomiting.  EXAM: CT ABDOMEN AND PELVIS WITH CONTRAST  TECHNIQUE: Multidetector CT imaging of the abdomen and pelvis was performed using the standard protocol following bolus administration of intravenous contrast.  CONTRAST:  OMNIPAQUE IOHEXOL 300 MG/ML  SOLN  COMPARISON:  None.  FINDINGS: Mild dependent atelectasis in the visualized lung bases. Unremarkable liver, nondilated gallbladder, spleen, adrenal glands, pancreas, left kidney. 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 No hydronephrosis or nephrolithiasis. Unremarkable aorta. Stomach is physiologically distended.  Multiple distended proximal small bowel loops. There is an umbilical hernia containing a loop of small bowel which appears to represent a transition point to decompressed distal small bowel. There is a small amount of extraluminal fluid around the loops in the umbilical hernia suggesting incarceration or strangulation. The colon is nondilated.  There is a small amount of pelvic ascites. . No free air. Urinary bladder incompletely distended. Uterus and adnexal regions unremarkable. Portal vein patent. No adenopathy localized. Facet degenerative changes in the lower lumbar spine.  IMPRESSION: 1. Small bowel obstruction secondary to involvement in a small umbilical hernia. 2. Small amount of pelvic ascites.   Electronically Signed   By: Oley Balm M.D.   On: 01/31/2013 21:30    EKG Interpretation   None       MDM   Ms. Skufca is a 52 year old Caucasian female who comes emergency department today with abdominal pain and vomiting. Physical exam as above. Initial workup included a urinalysis, CBC, CMP, lipase, and CT of the abdomen and  pelvis with contrast. She was initially treated with a milligram of Dilaudid for pain relief and a liter of normal saline. CBC had an elevated WBC of 16 was unremarkable. CMP sodium 134 glucose of 22 otherwise unremarkable. Lipase is 17.  As result doubt pancreatitis or acute cholecystitis.  UA was negative as result doubt urinary tract infection. CT of the abdomen and pelvis demonstrated a small bowel obstruction with an incarcerated umbilical hernia. The umbilical hernia was attempted to reduce to the emergency department however this is unsuccessful. An i-STAT lactic acid was obtained. An NG tube was placed. Surgery was counseled. Ms. Rivard was admitted to the surgical service in stable condition. Labs imaging review by myself and considered and medical decision-making. Imaging was interpreted by radiology. Care was discussed with my attending Dr. Oletta Lamas.  1. Incarcerated umbilical hernia   2. Small bowel obstruction   3. Abdominal pain   4. Vomiting   5. Lactic acidosis   6. Leukocytosis      Bethann Berkshire, MD 01/31/13 2320

## 2013-01-31 NOTE — ED Provider Notes (Signed)
Medical screening examination/treatment/procedure(s) were performed by a resident physician or non-physician practitioner and as the supervising physician I was immediately available for consultation/collaboration.  Alezandra Egli, MD    Sherwin Hollingshed S Amreen Raczkowski, MD 01/31/13 1913 

## 2013-01-31 NOTE — ED Notes (Signed)
Report to OR. Pt to surgery with EMT.  Sister followed pt to surgery waiting room.

## 2013-02-01 ENCOUNTER — Encounter (HOSPITAL_COMMUNITY): Payer: Self-pay | Admitting: *Deleted

## 2013-02-01 DIAGNOSIS — K55059 Acute (reversible) ischemia of intestine, part and extent unspecified: Secondary | ICD-10-CM

## 2013-02-01 LAB — BASIC METABOLIC PANEL
BUN: 11 mg/dL (ref 6–23)
Calcium: 8.1 mg/dL — ABNORMAL LOW (ref 8.4–10.5)
Chloride: 101 mEq/L (ref 96–112)
Creatinine, Ser: 0.62 mg/dL (ref 0.50–1.10)
GFR calc Af Amer: >90 mL/min (ref >90)
GFR calc non Af Amer: >90 mL/min (ref >90)
Glucose, Bld: 220 mg/dL — ABNORMAL HIGH (ref 70–99)

## 2013-02-01 LAB — CBC
HCT: 35.9 % — ABNORMAL LOW (ref 36.0–46.0)
MCH: 29.8 pg (ref 26.0–34.0)
MCHC: 33.4 g/dL (ref 30.0–36.0)
MCV: 89.1 fL (ref 78.0–100.0)
Platelets: 332 10*3/uL (ref 150–400)
RDW: 13.7 % (ref 11.5–15.5)
WBC: 11.6 10*3/uL — ABNORMAL HIGH (ref 4.0–10.5)

## 2013-02-01 LAB — GLUCOSE, CAPILLARY
Glucose-Capillary: 187 mg/dL — ABNORMAL HIGH (ref 70–99)
Glucose-Capillary: 206 mg/dL — ABNORMAL HIGH (ref 70–99)

## 2013-02-01 LAB — HEMOGLOBIN A1C: Hgb A1c MFr Bld: 6.2 % — ABNORMAL HIGH (ref <5.7)

## 2013-02-01 MED ORDER — OXYCODONE HCL 5 MG PO TABS: 5.0000 mg | ORAL_TABLET | Freq: Once | ORAL | Status: DC | PRN

## 2013-02-01 MED ORDER — CEFAZOLIN SODIUM-DEXTROSE 2-3 GM-% IV SOLR
INTRAVENOUS | Status: DC | PRN
  Administered 2013-01-31: 2 g via INTRAVENOUS

## 2013-02-01 MED ORDER — PIPERACILLIN-TAZOBACTAM 3.375 G IVPB
3.3750 g | Freq: Three times a day (TID) | INTRAVENOUS | Status: DC
  Administered 2013-02-01 – 2013-02-08 (×22): 3.375 g via INTRAVENOUS
  Filled 2013-02-01 (×24): qty 50

## 2013-02-01 MED ORDER — LIDOCAINE HCL (CARDIAC) 20 MG/ML IV SOLN
INTRAVENOUS | Status: DC | PRN
  Administered 2013-01-31: 100 mg via INTRAVENOUS

## 2013-02-01 MED ORDER — ESMOLOL HCL 10 MG/ML IV SOLN
INTRAVENOUS | Status: DC | PRN
  Administered 2013-01-31: 10 mg via INTRAVENOUS

## 2013-02-01 MED ORDER — ONDANSETRON HCL 4 MG/2ML IJ SOLN
INTRAMUSCULAR | Status: DC | PRN
  Administered 2013-02-01: 4 mg via INTRAVENOUS

## 2013-02-01 MED ORDER — POVIDONE-IODINE 10 % EX OINT
TOPICAL_OINTMENT | CUTANEOUS | Status: AC
  Filled 2013-02-01: qty 28.35

## 2013-02-01 MED ORDER — 0.9 % SODIUM CHLORIDE (POUR BTL) OPTIME
TOPICAL | Status: DC | PRN
  Administered 2013-02-01: 1000 mL

## 2013-02-01 MED ORDER — INSULIN ASPART 100 UNIT/ML ~~LOC~~ SOLN
0.0000 [IU] | Freq: Three times a day (TID) | SUBCUTANEOUS | Status: DC
  Administered 2013-02-01 (×2): 3 [IU] via SUBCUTANEOUS
  Administered 2013-02-01: 5 [IU] via SUBCUTANEOUS
  Administered 2013-02-02: 2 [IU] via SUBCUTANEOUS
  Administered 2013-02-02: 3 [IU] via SUBCUTANEOUS
  Administered 2013-02-02: 4 [IU] via SUBCUTANEOUS
  Administered 2013-02-03: 3 [IU] via SUBCUTANEOUS
  Administered 2013-02-03 – 2013-02-05 (×6): 2 [IU] via SUBCUTANEOUS
  Administered 2013-02-05 (×2): 3 [IU] via SUBCUTANEOUS
  Administered 2013-02-06 (×3): 2 [IU] via SUBCUTANEOUS
  Administered 2013-02-07: 3 [IU] via SUBCUTANEOUS
  Administered 2013-02-07 – 2013-02-08 (×2): 2 [IU] via SUBCUTANEOUS

## 2013-02-01 MED ORDER — PHENYLEPHRINE HCL 10 MG/ML IJ SOLN
INTRAMUSCULAR | Status: DC | PRN
  Administered 2013-02-01: 40 ug via INTRAVENOUS
  Administered 2013-02-01: 80 ug via INTRAVENOUS
  Administered 2013-02-01 (×3): 40 ug via INTRAVENOUS

## 2013-02-01 MED ORDER — PANTOPRAZOLE SODIUM 40 MG IV SOLR
40.0000 mg | INTRAVENOUS | Status: DC
  Administered 2013-02-01 – 2013-02-03 (×3): 40 mg via INTRAVENOUS
  Filled 2013-02-01 (×6): qty 40

## 2013-02-01 MED ORDER — HYDROMORPHONE HCL PF 1 MG/ML IJ SOLN
0.2500 mg | INTRAMUSCULAR | Status: DC | PRN
  Administered 2013-02-01 (×4): 0.5 mg via INTRAVENOUS

## 2013-02-01 MED ORDER — ONDANSETRON HCL 4 MG/2ML IJ SOLN
4.0000 mg | Freq: Four times a day (QID) | INTRAMUSCULAR | Status: DC | PRN
  Administered 2013-02-04: 4 mg via INTRAVENOUS
  Filled 2013-02-01: qty 2

## 2013-02-01 MED ORDER — OXYCODONE HCL 5 MG/5ML PO SOLN
5.0000 mg | Freq: Once | ORAL | Status: DC | PRN
Start: 2013-02-01 — End: 2013-02-01

## 2013-02-01 MED ORDER — PROMETHAZINE HCL 25 MG/ML IJ SOLN
INTRAMUSCULAR | Status: AC
  Filled 2013-02-01: qty 1

## 2013-02-01 MED ORDER — POVIDONE-IODINE 10 % EX OINT
TOPICAL_OINTMENT | CUTANEOUS | Status: DC | PRN
  Administered 2013-02-01: 1 via TOPICAL

## 2013-02-01 MED ORDER — LACTATED RINGERS IV SOLN
INTRAVENOUS | Status: DC | PRN
  Administered 2013-02-01 (×2): via INTRAVENOUS

## 2013-02-01 MED ORDER — ONDANSETRON HCL 4 MG PO TABS: 4.0000 mg | ORAL_TABLET | Freq: Four times a day (QID) | ORAL | Status: DC | PRN

## 2013-02-01 MED ORDER — PROMETHAZINE HCL 25 MG/ML IJ SOLN
6.2500 mg | INTRAMUSCULAR | Status: AC | PRN
  Administered 2013-02-01 (×2): 6.25 mg via INTRAVENOUS

## 2013-02-01 MED ORDER — ROCURONIUM BROMIDE 100 MG/10ML IV SOLN
INTRAVENOUS | Status: DC | PRN
  Administered 2013-02-01: 30 mg via INTRAVENOUS
  Administered 2013-02-01: 20 mg via INTRAVENOUS

## 2013-02-01 MED ORDER — PROPOFOL 10 MG/ML IV BOLUS
INTRAVENOUS | Status: DC | PRN
  Administered 2013-01-31: 200 mg via INTRAVENOUS

## 2013-02-01 MED ORDER — POTASSIUM CHLORIDE IN NACL 20-0.9 MEQ/L-% IV SOLN
INTRAVENOUS | Status: DC
  Administered 2013-02-01 – 2013-02-05 (×9): via INTRAVENOUS
  Filled 2013-02-01 (×14): qty 1000

## 2013-02-01 MED ORDER — PIPERACILLIN-TAZOBACTAM 3.375 G IVPB 30 MIN
3.3750 g | Freq: Once | INTRAVENOUS | Status: AC
  Administered 2013-02-01: 3.375 g via INTRAVENOUS
  Filled 2013-02-01: qty 50

## 2013-02-01 MED ORDER — HYDROMORPHONE HCL PF 1 MG/ML IJ SOLN
INTRAMUSCULAR | Status: AC
  Filled 2013-02-01: qty 1

## 2013-02-01 MED ORDER — MORPHINE SULFATE 4 MG/ML IJ SOLN
4.0000 mg | INTRAMUSCULAR | Status: DC | PRN
  Administered 2013-02-01 – 2013-02-04 (×20): 4 mg via INTRAVENOUS
  Filled 2013-02-01 (×20): qty 1

## 2013-02-01 MED ORDER — GLYCOPYRROLATE 0.2 MG/ML IJ SOLN
INTRAMUSCULAR | Status: DC | PRN
  Administered 2013-02-01: .7 mg via INTRAVENOUS

## 2013-02-01 MED ORDER — METOPROLOL TARTRATE 1 MG/ML IV SOLN
5.0000 mg | Freq: Four times a day (QID) | INTRAVENOUS | Status: DC
  Administered 2013-02-01 – 2013-02-05 (×16): 5 mg via INTRAVENOUS
  Filled 2013-02-01 (×22): qty 5

## 2013-02-01 MED ORDER — HEPARIN SODIUM (PORCINE) 5000 UNIT/ML IJ SOLN
5000.0000 [IU] | Freq: Three times a day (TID) | INTRAMUSCULAR | Status: DC
  Administered 2013-02-02 – 2013-02-08 (×19): 5000 [IU] via SUBCUTANEOUS
  Filled 2013-02-01 (×26): qty 1

## 2013-02-01 MED ORDER — SUCCINYLCHOLINE CHLORIDE 20 MG/ML IJ SOLN
INTRAMUSCULAR | Status: DC | PRN
  Administered 2013-01-31: 140 mg via INTRAVENOUS

## 2013-02-01 MED ORDER — NEOSTIGMINE METHYLSULFATE 1 MG/ML IJ SOLN
INTRAMUSCULAR | Status: DC | PRN
  Administered 2013-02-01: 5 mg via INTRAVENOUS

## 2013-02-01 NOTE — Transfer of Care (Signed)
Immediate Anesthesia Transfer of Care Note  Patient: Lauren Schmidt  Procedure(s) Performed: Procedure(s): HERNIA REPAIR UMBILICAL ADULT (N/A) SMALL BOWEL RESECTION (N/A)  Patient Location: PACU  Anesthesia Type:General  Level of Consciousness: awake, alert , oriented and patient cooperative  Airway & Oxygen Therapy: Patient Spontanous Breathing and Patient connected to face mask oxygen  Post-op Assessment: Report given to PACU RN and Post -op Vital signs reviewed and stable  Post vital signs: Reviewed and stable  Complications: No apparent anesthesia complications

## 2013-02-01 NOTE — Progress Notes (Signed)
Very sore, not using enough pain medicine Abd - distended Dressing with Betadine ointment  Incentive spirometer Ambulate  D/C foley tomorrow.  [name]. Corliss Skains, MD, [facility]  General/ Trauma Surgery  02/01/2013 1:11 PM

## 2013-02-01 NOTE — Progress Notes (Signed)
Orthopedic Tech Progress Note Patient Details:  Lauren Schmidt 1960-10-05 [id number]  Ortho Devices Type of Ortho Device: Abdominal binder Ortho Device/Splint Interventions: Ordered   Shawnie Pons 02/01/2013, 4:00 PM

## 2013-02-01 NOTE — Op Note (Signed)
01/31/2013 - 02/01/2013  1:21 AM  PATIENT:  Lauren Schmidt  52 y.o. female  PRE-OPERATIVE DIAGNOSIS:  incarcerated umbilical hernia  POST-OPERATIVE DIAGNOSIS:  Strangulated umbilical hernia  PROCEDURE:  Procedure(s): HERNIA REPAIR UMBILICAL ADULT (N/A) SMALL BOWEL RESECTION (N/A)  SURGEON:  Surgeon(s) and Role:    * Robyne Askew, MD - Primary  PHYSICIAN ASSISTANT:   ASSISTANTS: none   ANESTHESIA:   general  EBL:  Total I/O In: 1800 [I.V.:1800] Out: 110 [Urine:110]  BLOOD ADMINISTERED:none  DRAINS: none   LOCAL MEDICATIONS USED:  NONE  SPECIMEN:  Source of Specimen:  small bowel and hernia sac  DISPOSITION OF SPECIMEN:  PATHOLOGY  COUNTS:  YES  TOURNIQUET:  * No tourniquets in log *  DICTATION: .Dragon Dictation After informed consent was obtained the patient was brought to the operating room placed in supine position on the operating room table. After adequate induction of general anesthesia the patient's eye was prepped with ChloraPrep, allowed to dry, and draped in usual sterile manner. An upper midline incision was made just above the umbilicus with a 10 blade knife. This incision was carried through the skin and subcutaneous tissue sharply with the electrocautery until the hernia and the linea alba were identified. The linea alba above the hernia was incised with the electrocautery and Metzenbaum scissors. The pre-peritoneal area was dissected bluntly with a hemostat until the peritoneum was identified. The peritoneum was opened sharply with Metzenbaum scissors. This allowed [place] access to the abdominal cavity. We then opened the fascia through the edges of the hernia defect. This allowed [place] to examine the contents of the hernia sac. There was a small segment of small bowel within the hernia that was incarcerated and strangulated with necrosis of the small bowel wall. We were then able to eviscerate the small bowel above and below this area. We chose a site above and  below the necrotic area for division of the small bowel. The mesentery to these points was opened sharply with electrocautery. A GI 75 stapler was placed across the small bowel at each of these points, clamped, and fired Thereby dividing the small bowel between Staple lines. The mesentery to the affected segment was then taken down with hemostats and the mesentery was divided. Each of these areas was ligated with 2-0 silk ties. The necrotic area small bowel was then removed from the patient and sent to pathology. The proximal and distal small bowel readily approximated each other. A small opening was made on the antimesenteric border of each limb of small bowel. Next each limb of the GIA-75 stapler was placed down the appropriate limb of small bowel, clamped, and fired there by creating nice widely patent enteroenterostomy. The common opening was closed with a TA 60 stapler. The mesentery was also closed with interrupted 2-0 silk stitches. The staple line was reinforced with multiple 2-0 silk Lembert stitches and a 2-0 silk crotch stitch was also placed. The area was then irrigated with copious amounts of saline. The anastomosis was then placed back inside the abdominal cavity. The fascia of the anterior wall was then closed with interrupted #1 Novafil stitches in a figure-of-eight manner. The subcutaneous tissue was then irrigated with copious amounts of saline and Betadine. The hernia sac was excised sharply with the electrocautery. The subcutaneous tissue was then closed with interrupted 2-0 Vicryl stitches. The skin was then closed with staples. Sterile dressings were applied. The patient tolerated the procedure well. At the end of the case all needle  sponge and instrument counts were correct. The patient was then awakened and taken to recovery in stable condition.  PLAN OF CARE: Admit to inpatient   PATIENT DISPOSITION:  PACU - hemodynamically stable.   Delay start of Pharmacological VTE agent (>24hrs)  due to surgical blood loss or risk of bleeding: no

## 2013-02-01 NOTE — Progress Notes (Signed)
Patient ID: Lauren Schmidt, female   DOB: 1960/09/13, 52 y.o.   MRN: [id number] 1 Day Post-Op  Subjective: Patient reports she is feeling "rough." Her pain is an 8/10 currently. She slept through the night, but woke to pain this morning. She complains of being extremely hot. Her back is hurting her from lying in bed and she desires to get up and sit in chair. She denies vomit or flatus, but admits to mild nausea through he night.   Objective: Vital signs in last 24 hours: Temp:  [97.8 F (36.6 C)-98.9 F (37.2 C)] 98.9 F (37.2 C) (10/27 0602) Pulse Rate:  [92-116] 115 (10/27 0602) Resp:  [16-24] 18 (10/27 0602) BP: (102-150)/(63-85) 130/75 mmHg (10/27 0602) SpO2:  [91 %-99 %] 98 % (10/27 0602) Weight:  [279 lb 1.6 oz (126.599 kg)] 279 lb 1.6 oz (126.599 kg) (10/27 0251)    Intake/Output from previous day: 10/26 0701 - 10/27 0700 In: 2200 [I.V.:2200] Out: 740 [Urine:630; Emesis/NG output:80; Blood:30] Intake/Output this shift:    PE: Gen: Afebrile. Appears weak, and in pain. NG tube placed with 100 mL green bile in suction container. CV: RRR  Chest: CTAB, no wheeze or crackles Abd: Soft. Obese. Appropriately tender. BS absent. No Masses palpated. Honeycomb dressing with small amount of dry blood/betadine stain, otherwise intact without current drainage.  Ext: No erythema, edema or tenderness. SCD's in place and functioning.   Lab Results:   Recent Labs  01/31/13 1603 02/01/13 0230  WBC 16.0* 11.6*  HGB 13.9 12.0  HCT 41.0 35.9*  PLT 398 332   BMET  Recent Labs  01/31/13 1603 02/01/13 0230  NA 134* 135  K 4.0 3.9  CL 96 101  CO2 25 21  GLUCOSE 202* 220*  BUN 10 11  CREATININE 0.59 0.62  CALCIUM 9.7 8.1*   PT/INR No results found for this basename: LABPROT, INR,  in the last 72 hours CMP     Component Value Date/Time   NA 135 02/01/2013 0230   K 3.9 02/01/2013 0230   CL 101 02/01/2013 0230   CO2 21 02/01/2013 0230   GLUCOSE 220* 02/01/2013 0230   BUN 11  02/01/2013 0230   CREATININE 0.62 02/01/2013 0230   CALCIUM 8.1* 02/01/2013 0230   PROT 8.4* 01/31/2013 1603   ALBUMIN 4.0 01/31/2013 1603   AST 20 01/31/2013 1603   ALT 22 01/31/2013 1603   ALKPHOS 49 01/31/2013 1603   BILITOT 0.6 01/31/2013 1603   GFRNONAA >90 02/01/2013 0230   GFRAA >90 02/01/2013 0230   Lipase     Component Value Date/Time   LIPASE 17 01/31/2013 1603     CBG (last 3)   Recent Labs  02/01/13 0148  GLUCAP 187*      Studies/Results: Ct Abdomen Pelvis W Contrast  01/31/2013   CLINICAL DATA:  Abdominal pain, vomiting.  EXAM: CT ABDOMEN AND PELVIS WITH CONTRAST  TECHNIQUE: Multidetector CT imaging of the abdomen and pelvis was performed using the standard protocol following bolus administration of intravenous contrast.  CONTRAST:  OMNIPAQUE IOHEXOL 300 MG/ML  SOLN  COMPARISON:  None.  FINDINGS: Mild dependent atelectasis in the visualized lung bases. Unremarkable liver, nondilated gallbladder, spleen, adrenal glands, pancreas, left kidney. 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 No hydronephrosis or nephrolithiasis. Unremarkable aorta. Stomach is physiologically distended.  Multiple distended proximal small bowel loops. There is an umbilical hernia containing a loop of small bowel which appears to represent a  transition point to decompressed distal small bowel. There is a small amount of extraluminal fluid around the loops in the umbilical hernia suggesting incarceration or strangulation. The colon is nondilated. There is a small amount of pelvic ascites. . No free air. Urinary bladder incompletely distended. Uterus and adnexal regions unremarkable. Portal vein patent. No adenopathy localized. Facet degenerative changes in the lower lumbar spine.  IMPRESSION: 1. Small bowel obstruction secondary to involvement in a small umbilical hernia. 2. Small amount of pelvic ascites.   Electronically Signed   By: Oley Balm M.D.   On: 01/31/2013 21:30    Anti-infectives: Anti-infectives   Start     Dose/Rate Route Frequency Ordered Stop   02/01/13 0800  piperacillin-tazobactam (ZOSYN) IVPB 3.375 g     3.375 g 12.5 mL/hr over 240 Minutes Intravenous Every 8 hours 02/01/13 0136     02/01/13 0315  piperacillin-tazobactam (ZOSYN) IVPB 3.375 g     3.375 g 100 mL/hr over 30 Minutes Intravenous  Once 02/01/13 0305 02/01/13 0528   01/31/13 2300  [MAR Hold]  ceFAZolin (ANCEF) IVPB 1 g/50 mL premix  Status:  Discontinued     (On MAR Hold since 01/31/13 2348)   1 g 100 mL/hr over 30 Minutes Intravenous  Once 01/31/13 2255 02/01/13 0155       Assessment: Incarcerated/strangulated umbilical hernia s/p HERNIA REPAIR UMBILICAL with SMALL BOWEL RESECTION  Small bowel Obstruction Abdominal pain  Vomiting  Lactic acidosis  Leukocytosis  Diabetes Mellitus  Hypertension Tachycardia Oxygen Use  Plan:  S/p umbilical hernia repair with small bowel resection POD #1:Tolerating well. Feeling nauseated intermittently.  Diet: NPO with NG placement, will keep for today Encourage patient to ambulate with assistance, move to bedside chair today. Leukocytosis: improved s/p surgery and antibiotic administration. Continue antibiotics. Pain:  controlled on current regimen morphine 4 mg Q 1hr.  Diabetes Mellitus: CBG 187 - 225; Currently managed with SSI, will continue until normal diet is tolerated.  Hypertension: Currently controlled without home regimen of lisinopril and atenolol. Will add back home meds once NPO status is discontinued. Will continue to monitor for IV medication need.  Tachycardia: Likely d/t to pain and discontinuation of atenolol while NPO status. Will continue to monitor and add back medication as needed. Oxygen use: Patient does not require oxygen use at home. Currently on 3LNC with 98% O2, will wean as able keeping O2 saturation >92%. Use of IS strongly encouraged.  DISPO: pending post surgical  recovery, pain control, advancement of diet and ambulation.   LOS: 1 day    Felix Pacini 02/01/2013, 7:43 AM Pager: [phone]

## 2013-02-01 NOTE — Anesthesia Procedure Notes (Signed)
Procedure Name: Intubation Date/Time: 01/31/2013 11:58 PM Performed by: Julianne Rice Z Pre-anesthesia Checklist: Patient identified, Timeout performed, Emergency Drugs available, Suction available and Patient being monitored Patient Re-evaluated:Patient Re-evaluated prior to inductionOxygen Delivery Method: Circle system utilized Preoxygenation: Pre-oxygenation with 100% oxygen Intubation Type: IV induction, Rapid sequence and Cricoid Pressure applied Tube type: Oral Tube size: 7.5 mm Number of attempts: 1 Airway Equipment and Method: Stylet and Video-laryngoscopy Placement Confirmation: ETT inserted through vocal cords under direct vision,  breath sounds checked- equal and bilateral and positive ETCO2 Secured at: 21 cm Tube secured with: Tape Dental Injury: Teeth and Oropharynx as per pre-operative assessment  Difficulty Due To: Difficulty was anticipated

## 2013-02-01 NOTE — Progress Notes (Signed)
Orthopedic Tech Progress Note Patient Details:  Lauren Schmidt [birth year] [id number]  Ortho Devices Type of Ortho Device: Abdominal binder Ortho Device/Splint Interventions: Ordered   Shawnie Pons 02/01/2013, 2:47 PM

## 2013-02-01 NOTE — Anesthesia Postprocedure Evaluation (Signed)
  Anesthesia Post-op Note  Patient: Lauren Schmidt  Procedure(s) Performed: Procedure(s): HERNIA REPAIR UMBILICAL ADULT (N/A) SMALL BOWEL RESECTION (N/A)  Patient Location: PACU  Anesthesia Type:General  Level of Consciousness: awake and alert   Airway and Oxygen Therapy: Patient Spontanous Breathing  Post-op Pain: mild  Post-op Assessment: Post-op Vital signs reviewed, Patient's Cardiovascular Status Stable, Respiratory Function Stable, Patent Airway, No signs of Nausea or vomiting and Pain level controlled  Post-op Vital Signs: Reviewed and stable  Complications: No apparent anesthesia complications

## 2013-02-01 NOTE — Progress Notes (Signed)
Inpatient Diabetes Program Recommendations  AACE/ADA: New Consensus Statement on Inpatient Glycemic Control (2013)  Target Ranges:  Prepandial:   less than 140 mg/dL      Peak postprandial:   less than 180 mg/dL (1-2 hours)      Critically ill patients:  140 - 180 mg/dL   Glucose running in 161'W Pt NPO Inpatient Diabetes Program Recommendations Correction (SSI): Please change moderate correction to q 4hrs while NPO Thank you, Lenor Coffin, RN, CNS, Diabetes Coordinator [phone])

## 2013-02-02 LAB — GLUCOSE, CAPILLARY
Glucose-Capillary: 180 mg/dL — ABNORMAL HIGH (ref 70–99)
Glucose-Capillary: 152 mg/dL — ABNORMAL HIGH (ref 70–99)
Glucose-Capillary: 127 mg/dL — ABNORMAL HIGH (ref 70–99)

## 2013-02-02 MED ORDER — PHENOL 1.4 % MT LIQD
1.0000 | OROMUCOSAL | Status: DC | PRN
  Administered 2013-02-02: 1 via OROMUCOSAL
  Filled 2013-02-02 (×2): qty 177

## 2013-02-02 MED ORDER — SERTRALINE HCL 100 MG PO TABS
200.0000 mg | ORAL_TABLET | Freq: Every day | ORAL | Status: DC
  Administered 2013-02-02 – 2013-02-08 (×7): 200 mg via ORAL
  Filled 2013-02-02 (×9): qty 2

## 2013-02-02 NOTE — Progress Notes (Signed)
Still with post-operative ileus.   Incision OK Distended; minimal bowel sounds  Will continue NG tube until bowel function returns.  [name]. Corliss Skains, MD, [facility] Surgery  General/ Trauma Surgery  02/02/2013 1:29 PM

## 2013-02-02 NOTE — Progress Notes (Signed)
2 Days Post-Op  Subjective: No flatus up to bathroom, still tender when up but not taking much for pain.  Objective: Vital signs in last 24 hours: Temp:  [97.6 F (36.4 C)-99.3 F (37.4 C)] 98.1 F (36.7 C) (10/28 0504) Pulse Rate:  [115-121] 121 (10/28 0504) Resp:  [18-20] 18 (10/28 0504) BP: (117-141)/(60-67) 117/60 mmHg (10/28 0504) SpO2:  [90 %-100 %] 90 % (10/28 0504)   350 per NG,  750 urine output recorded NPO Afebrile, VSS tachycardic on O@ with sats around 90 No labs Glucose 160-200 Intake/Output from previous day: 10/27 0701 - 10/28 0700 In: 2646.7 [I.V.:2496.7; IV Piggyback:150] Out: 1100 [Urine:750; Emesis/NG output:350] Intake/Output this shift:    General appearance: alert, cooperative and no distress Resp: clear to auscultation bilaterally and ant GI: binder in place, dressing is dry, no bowel sound, she is still very tender.  Lab Results:   Recent Labs  01/31/13 1603 02/01/13 0230  WBC 16.0* 11.6*  HGB 13.9 12.0  HCT 41.0 35.9*  PLT 398 332    BMET  Recent Labs  01/31/13 1603 02/01/13 0230  NA 134* 135  K 4.0 3.9  CL 96 101  CO2 25 21  GLUCOSE 202* 220*  BUN 10 11  CREATININE 0.59 0.62  CALCIUM 9.7 8.1*   PT/INR No results found for this basename: LABPROT, INR,  in the last 72 hours   Recent Labs Lab 01/31/13 1603  AST 20  ALT 22  ALKPHOS 49  BILITOT 0.6  PROT 8.4*  ALBUMIN 4.0     Lipase     Component Value Date/Time   LIPASE 17 01/31/2013 1603     Studies/Results: Ct Abdomen Pelvis W Contrast  01/31/2013   CLINICAL DATA:  Abdominal pain, vomiting.  EXAM: CT ABDOMEN AND PELVIS WITH CONTRAST  TECHNIQUE: Multidetector CT imaging of the abdomen and pelvis was performed using the standard protocol following bolus administration of intravenous contrast.  CONTRAST:  OMNIPAQUE IOHEXOL 300 MG/ML  SOLN  COMPARISON:  None.  FINDINGS: Mild dependent atelectasis in the visualized lung bases. Unremarkable liver, nondilated  gallbladder, spleen, adrenal glands, pancreas, left kidney. 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 No hydronephrosis or nephrolithiasis. Unremarkable aorta. Stomach is physiologically distended.  Multiple distended proximal small bowel loops. There is an umbilical hernia containing a loop of small bowel which appears to represent a transition point to decompressed distal small bowel. There is a small amount of extraluminal fluid around the loops in the umbilical hernia suggesting incarceration or strangulation. The colon is nondilated. There is a small amount of pelvic ascites. . No free air. Urinary bladder incompletely distended. Uterus and adnexal regions unremarkable. Portal vein patent. No adenopathy localized. Facet degenerative changes in the lower lumbar spine.  IMPRESSION: 1. Small bowel obstruction secondary to involvement in a small umbilical hernia. 2. Small amount of pelvic ascites.   Electronically Signed   By: Oley Balm M.D.   On: 01/31/2013 21:30    Medications: . heparin  5,000 Units Subcutaneous Q8H  . insulin aspart  0-15 Units Subcutaneous TID WC  . metoprolol  5 mg Intravenous Q6H  . pantoprazole (PROTONIX) IV  40 mg Intravenous Q24H  . piperacillin-tazobactam (ZOSYN)  IV  3.375 g Intravenous Q8H  . sodium chloride  1,000 mL Intravenous Once   . 0.9 % NaCl with KCl 20 mEq / L 100 mL/hr at 02/02/13 0012   Prior to Admission medications   Medication Sig  Start Date End Date Taking? Authorizing Provider  ATENOLOL PO Take by mouth.   Yes Historical Provider, MD  Fenofibrate (TRICOR PO) Take by mouth.   Yes Historical Provider, MD  GLIMEPIRIDE PO Take 1 tablet by mouth daily.   Yes Historical Provider, MD  LISINOPRIL PO Take 1 tablet by mouth daily.   Yes Historical Provider, MD  METFORMIN HCL PO Take by mouth.   Yes Historical Provider, MD  omega-3 acid ethyl esters (LOVAZA) 1 G capsule Take 2 g by mouth 2 (two) times  daily.   Yes Historical Provider, MD  SIMVASTATIN PO Take 1 tablet by mouth daily.   Yes Historical Provider, MD     Assessment/Plan 1.  Strangulated umbilical hernia, s/p Umbilical hernia repair with small       bowel resection.  01/31/13 Dr. Chevis Pretty 2.  AODM 3.  Hypertension 4.  Dyslipidemia 5.  Body mass index is 54.51   Plan:  Mobilize, continue NG, it is working, she has IS, continue NPO.   LOS: 2 days    Shahed Yeoman 02/02/2013

## 2013-02-03 LAB — CBC
HCT: 30.4 % — ABNORMAL LOW (ref 36.0–46.0)
Hemoglobin: 10 g/dL — ABNORMAL LOW (ref 12.0–15.0)
MCH: 30 pg (ref 26.0–34.0)
MCHC: 32.9 g/dL (ref 30.0–36.0)
MCV: 91.3 fL (ref 78.0–100.0)
RDW: 13.9 % (ref 11.5–15.5)
WBC: 7 10*3/uL (ref 4.0–10.5)

## 2013-02-03 LAB — BASIC METABOLIC PANEL
BUN: 10 mg/dL (ref 6–23)
CO2: 20 mEq/L (ref 19–32)
Calcium: 8 mg/dL — ABNORMAL LOW (ref 8.4–10.5)
Chloride: 101 mEq/L (ref 96–112)
GFR calc Af Amer: >90 mL/min (ref >90)
GFR calc non Af Amer: >90 mL/min (ref >90)
Potassium: 3.3 mEq/L — ABNORMAL LOW (ref 3.5–5.1)
Sodium: 135 mEq/L (ref 135–145)

## 2013-02-03 LAB — GLUCOSE, CAPILLARY
Glucose-Capillary: 165 mg/dL — ABNORMAL HIGH (ref 70–99)
Glucose-Capillary: 148 mg/dL — ABNORMAL HIGH (ref 70–99)

## 2013-02-03 NOTE — Progress Notes (Signed)
3 Days Post-Op  Subjective: Say she passed gas once but no BM.  Hard for her to get up or down without help.  Still feels bad.  Objective: Vital signs in last 24 hours: Temp:  [98.2 F (36.8 C)-98.5 F (36.9 C)] 98.2 F (36.8 C) (10/29 0508) Pulse Rate:  [95-120] 95 (10/29 0508) Resp:  [16-18] 18 (10/29 0508) BP: (130-165)/(63-77) 139/71 mmHg (10/29 0508) SpO2:  [95 %-98 %] 95 % (10/29 0508) FiO2 (%):  [2 %] 2 % (10/28 1000)   250 per NG, 1BM recorded Diet: clears Afebrile, a little tachycardic K+ 3.3 WBC 7000, H/H down a little  Intake/Output from previous day: 10/28 0701 - 10/29 0700 In: 2606.7 [I.V.:2456.7; IV Piggyback:150] Out: 551 [Urine:300; Emesis/NG output:250; Stool:1] Intake/Output this shift:    General appearance: alert, cooperative and no distress Resp: clear to auscultation bilaterally GI: tender, +BS, incisions dressing OK  Lab Results:   Recent Labs  02/01/13 0230 02/03/13 0440  WBC 11.6* 7.0  HGB 12.0 10.0*  HCT 35.9* 30.4*  PLT 332 330    BMET  Recent Labs  02/01/13 0230 02/03/13 0440  NA 135 135  K 3.9 3.3*  CL 101 101  CO2 21 20  GLUCOSE 220* 174*  BUN 11 10  CREATININE 0.62 0.45*  CALCIUM 8.1* 8.0*   PT/INR No results found for this basename: LABPROT, INR,  in the last 72 hours   Recent Labs Lab 01/31/13 1603  AST 20  ALT 22  ALKPHOS 49  BILITOT 0.6  PROT 8.4*  ALBUMIN 4.0     Lipase     Component Value Date/Time   LIPASE 17 01/31/2013 1603     Studies/Results: No results found.  Medications: . heparin  5,000 Units Subcutaneous Q8H  . insulin aspart  0-15 Units Subcutaneous TID WC  . metoprolol  5 mg Intravenous Q6H  . pantoprazole (PROTONIX) IV  40 mg Intravenous Q24H  . piperacillin-tazobactam (ZOSYN)  IV  3.375 g Intravenous Q8H  . sertraline  200 mg Oral Daily  . sodium chloride  1,000 mL Intravenous Once    Assessment/Plan 1. Strangulated umbilical hernia, s/p Umbilical hernia repair with small  bowel resection. 01/31/13 Dr. Chevis Pretty      Post op ileus 2. AODM  3. Hypertension  4. Dyslipidemia  5. Body mass index is 54.51   Plan:  NG clamping trials, PT to help with mobility. She has a binder. Heparin for DVT  LOS: 3 days    Chrsitopher Wik 02/03/2013

## 2013-02-03 NOTE — Progress Notes (Signed)
Clamping NG trails done today started at 845am. Was placed on suction for two hours starting at 1245pm 50cc return of clear fluid. Was clamped off again at 1545.

## 2013-02-03 NOTE — Progress Notes (Signed)
Seen and agree Looks good walking in hall

## 2013-02-04 ENCOUNTER — Inpatient Hospital Stay (HOSPITAL_COMMUNITY): Payer: BC Managed Care – PPO

## 2013-02-04 DIAGNOSIS — I10 Essential (primary) hypertension: Secondary | ICD-10-CM

## 2013-02-04 LAB — BASIC METABOLIC PANEL
CO2: 22 mEq/L (ref 19–32)
Calcium: 8.1 mg/dL — ABNORMAL LOW (ref 8.4–10.5)
Chloride: 107 mEq/L (ref 96–112)
Glucose, Bld: 137 mg/dL — ABNORMAL HIGH (ref 70–99)
Potassium: 3.6 mEq/L (ref 3.5–5.1)
Sodium: 142 mEq/L (ref 135–145)

## 2013-02-04 LAB — CBC
Hemoglobin: 9.3 g/dL — ABNORMAL LOW (ref 12.0–15.0)
MCH: 29.5 pg (ref 26.0–34.0)
RBC: 3.15 MIL/uL — ABNORMAL LOW (ref 3.87–5.11)
WBC: 8 10*3/uL (ref 4.0–10.5)

## 2013-02-04 LAB — GLUCOSE, CAPILLARY
Glucose-Capillary: 140 mg/dL — ABNORMAL HIGH (ref 70–99)
Glucose-Capillary: 133 mg/dL — ABNORMAL HIGH (ref 70–99)
Glucose-Capillary: 135 mg/dL — ABNORMAL HIGH (ref 70–99)

## 2013-02-04 MED ORDER — IOHEXOL 300 MG/ML  SOLN
100.0000 mL | Freq: Once | INTRAMUSCULAR | Status: AC | PRN
  Administered 2013-02-04: 100 mL via INTRAVENOUS

## 2013-02-04 MED ORDER — HYDROMORPHONE HCL PF 1 MG/ML IJ SOLN
1.0000 mg | INTRAMUSCULAR | Status: DC | PRN
  Administered 2013-02-04 – 2013-02-07 (×4): 1 mg via INTRAVENOUS
  Filled 2013-02-04 (×4): qty 1

## 2013-02-04 MED ORDER — IOHEXOL 300 MG/ML  SOLN
25.0000 mL | INTRAMUSCULAR | Status: AC
  Administered 2013-02-04: 25 mL via ORAL

## 2013-02-04 MED ORDER — METOPROLOL TARTRATE 1 MG/ML IV SOLN
5.0000 mg | Freq: Four times a day (QID) | INTRAVENOUS | Status: DC | PRN
  Filled 2013-02-04: qty 5

## 2013-02-04 MED ORDER — METOCLOPRAMIDE HCL 5 MG/ML IJ SOLN
5.0000 mg | Freq: Four times a day (QID) | INTRAMUSCULAR | Status: DC
  Administered 2013-02-04 – 2013-02-05 (×4): 5 mg via INTRAVENOUS
  Filled 2013-02-04 (×2): qty 1
  Filled 2013-02-04: qty 2
  Filled 2013-02-04: qty 1
  Filled 2013-02-04 (×2): qty 2
  Filled 2013-02-04: qty 1

## 2013-02-04 MED ORDER — PANTOPRAZOLE SODIUM 40 MG IV SOLR
40.0000 mg | Freq: Two times a day (BID) | INTRAVENOUS | Status: DC
  Administered 2013-02-04 – 2013-02-07 (×7): 40 mg via INTRAVENOUS
  Filled 2013-02-04 (×8): qty 40

## 2013-02-04 NOTE — Progress Notes (Signed)
Patient with prolonged ileus - plain films show possible ileus vs obstruction WBC normal Recheck CT  Molli Hazard K. Corliss Skains, MD, [facility] Surgery  General/ Trauma Surgery  02/04/2013 3:05 PM

## 2013-02-04 NOTE — Evaluation (Signed)
Physical Therapy Evaluation Patient Details Name: Lauren Schmidt MRN: [id number] DOB: Nov 20, 1960 Today's Date: 02/04/2013 Time: 4098-1191 PT Time Calculation (min): 16 min  PT Assessment / Plan / Recommendation History of Present Illness  The pt is a 52 yo wf who began having abdominal pain yesterday. Pain has worsened. She started having nausea and vomiting today. She denies any fever or chill. She came to ER where CT shows an incarcerated umbilical hernia causing a bowel obstruction. Pt now with post operative ilieus.  Clinical Impression  Patient independent with ambulation despite pain and discomfort. No acute PT needs identified at this time. Encouraged patient to continue ambulation frequently. No PT follow up recommended. Will sign off, Pt in agreement.    PT Assessment  Patent does not need any further PT services    Follow Up Recommendations  No PT follow up    Does the patient have the potential to tolerate intense rehabilitation      Barriers to Discharge        Equipment Recommendations  None recommended by PT    Recommendations for Other Services     Frequency      Precautions / Restrictions Precautions Precautions: Other (comment) (NG tube in place) Restrictions Weight Bearing Restrictions: No   Pertinent Vitals/Pain 10/10 pain reported      Mobility  Bed Mobility Bed Mobility: Supine to Sit;Sitting - Scoot to Edge of Bed;Sit to Supine Supine to Sit: 7: Independent Sitting - Scoot to Edge of Bed: 7: Independent Sit to Supine: 7: Independent Transfers Transfers: Sit to Stand;Stand to Sit Sit to Stand: 7: Independent Stand to Sit: 7: Independent Ambulation/Gait Ambulation/Gait Assistance: 7: Independent Ambulation Distance (Feet): 600 Feet Assistive device:  (pushing IV pole) Ambulation/Gait Assistance Details: no assist    Exercises     PT Diagnosis:    PT Problem List:   PT Treatment Interventions:       PT Goals(Current goals can be found  in the care plan section) Acute Rehab PT Goals PT Goal Formulation: No goals set, d/c therapy  Visit Information  Last PT Received On: 02/04/13 Assistance Needed: +1 History of Present Illness: The pt is a 52 yo wf who began having abdominal pain yesterday. Pain has worsened. She started having nausea and vomiting today. She denies any fever or chill. She came to ER where CT shows an incarcerated umbilical hernia causing a bowel obstruction. Pt now with post operative ilieus.       Prior Functioning  Home Living Family/patient expects to be discharged to:: Private residence Living Arrangements: Alone Available Help at Discharge: Family;Available PRN/intermittently Type of Home: House Home Access: Stairs to enter [employer] of Steps: 2 Entrance Stairs-Rails: Can reach both Home Layout: One level Home Equipment: None Prior Function Level of Independence: Independent Communication Communication: No difficulties Dominant Hand: Right    Cognition  Cognition Arousal/Alertness: Awake/alert Behavior During Therapy: WFL for tasks assessed/performed Overall Cognitive Status: Within Functional Limits for tasks assessed    Extremity/Trunk Assessment Upper Extremity Assessment Upper Extremity Assessment: Overall WFL for tasks assessed Lower Extremity Assessment Lower Extremity Assessment: Overall WFL for tasks assessed   Balance High Level Balance High Level Balance Activites: Side stepping;Backward walking;Direction changes;Turns High Level Balance Comments: WFL  End of Session PT - End of Session Equipment Utilized During Treatment: Other (comment) (NG tube ) Activity Tolerance: Patient limited by pain Patient left: in bed;with call bell/phone within reach;with bed alarm set;with nursing/sitter in room;with family/visitor present Nurse Communication: Mobility status  GP     Fabio Asa 02/04/2013, 11:22 AM Charlotte Crumb, PT DPT  [phone]

## 2013-02-04 NOTE — Progress Notes (Signed)
Clamping NG trials continued through the night.  NG to suction for 2 hours at 20:30 with 100cc of brown, clear fluid and then clamped.  NG to suction from to 03:15 to 05:30 with 50cc of yellowish clear fluid.  Patient complained of increased cramping and "bloated" feeling in her middle upper abdomen early this morning, but pain was eased with Morphine.  Patient also indicated this morning that she is on her period, but says she is bleeding "more than usual" and expressed fear that it was associated with her surgery.  Vital signs and hemoglobin stable.  Patient encouraged to discuss this with the doctor this morning.

## 2013-02-04 NOTE — Progress Notes (Signed)
4 Days Post-Op  Subjective: Pt states she walked 4 times yesterday, had a good day.  Developed distention and worsening abdominal pain at 4am.  Associated with nausea, denies vomiting.  She is tearful.  Pt began to vomit shortly after my evaluation  Objective: Vital signs in last 24 hours: Temp:  [97.5 F (36.4 C)-98.2 F (36.8 C)] 97.8 F (36.6 C) (10/30 0825) Pulse Rate:  [86-99] 99 (10/30 0825) Resp:  [20] 20 (10/30 0825) BP: (146-183)/(69-95) 183/95 mmHg (10/30 0825) SpO2:  [98 %-100 %] 100 % (10/30 0825) Last BM Date: 01/30/13  Intake/Output from previous day: 10/29 0701 - 10/30 0700 In: 2495 [I.V.:2345; IV Piggyback:150] Out: 450 [Urine:300; Emesis/NG output:150] Intake/Output this shift:    General appearance: alert, cooperative and mild distress Resp: clear to auscultation bilaterally Cardio: regular rate and rhythm, S1, S2 normal, no murmur, click, rub or gallop GI: diffusely tender, dressing removed staples are intact. there is small amount of erythema distall, further away from the incision.  there is nor drainage.   Extremities: extremities normal, atraumatic, no cyanosis or edema  Lab Results:   Recent Labs  02/03/13 0440  WBC 7.0  HGB 10.0*  HCT 30.4*  PLT 330   BMET  Recent Labs  02/03/13 0440  NA 135  K 3.3*  CL 101  CO2 20  GLUCOSE 174*  BUN 10  CREATININE 0.45*  CALCIUM 8.0*    Anti-infectives: Anti-infectives   Start     Dose/Rate Route Frequency Ordered Stop   02/01/13 0800  piperacillin-tazobactam (ZOSYN) IVPB 3.375 g     3.375 g 12.5 mL/hr over 240 Minutes Intravenous Every 8 hours 02/01/13 0136     02/01/13 0315  piperacillin-tazobactam (ZOSYN) IVPB 3.375 g     3.375 g 100 mL/hr over 30 Minutes Intravenous  Once 02/01/13 0305 02/01/13 0528   01/31/13 2300  [MAR Hold]  ceFAZolin (ANCEF) IVPB 1 g/50 mL premix  Status:  Discontinued     (On MAR Hold since 01/31/13 2348)   1 g 100 mL/hr over 30 Minutes Intravenous  Once 01/31/13  2255 02/01/13 0155      Assessment/Plan: 1. Strangulated umbilical hernia, s/p Umbilical hernia repair with small bowel resection. 01/31/13 Dr. Chevis Pretty  Post op ileus  2. AODM  3. Hypertension  4. Dyslipidemia  5. Body mass index is 54.51   POD #4 -NGT to low intermittent suction.  Repeat CBC and BMP.  Obtain plain films.  Will discuss repeating a CT of abdomen, but I suspect this is from her ileus. -add reglan, prn zofran -continue with IV hydration  -pain control, change to dilaudid  -add PRN metoprolol for SBP >180 -continue with binder -SCDs, heparin, mobilize as tolerated.   LOS: 4 days     Johnte Portnoy ANP-BC 02/04/2013  9:08 AM

## 2013-02-04 NOTE — ED Provider Notes (Signed)
I saw and evaluated the patient, reviewed the resident's note and I agree with the findings and plan.  EKG Interpretation     Ventricular Rate:    PR Interval:    QRS Duration:   QT Interval:    QTC Calculation:   R Axis:     Text Interpretation:             Pt with abd pain, n/v.  pts pain initially more diffuse, after pain meds, tenderness seems more focal to LLQ.  Low grade temp, elevate WBC to 16.  Will treat with IV meds, keep NPO, obtain CT of abd, possibly pt has diverticulitis.  No sig h/o prior abd surgeries.  Pt is ill, but not toxic appearing.     Gavin Pound. Shavon Zenz, MD 02/04/13 2231

## 2013-02-05 DIAGNOSIS — E119 Type 2 diabetes mellitus without complications: Secondary | ICD-10-CM

## 2013-02-05 LAB — CBC
HCT: 28.4 % — ABNORMAL LOW (ref 36.0–46.0)
Hemoglobin: 9.1 g/dL — ABNORMAL LOW (ref 12.0–15.0)
MCHC: 32 g/dL (ref 30.0–36.0)
MCV: 91.6 fL (ref 78.0–100.0)
RBC: 3.1 MIL/uL — ABNORMAL LOW (ref 3.87–5.11)
WBC: 11.5 10*3/uL — ABNORMAL HIGH (ref 4.0–10.5)

## 2013-02-05 MED ORDER — METOCLOPRAMIDE HCL 5 MG/ML IJ SOLN
5.0000 mg | Freq: Four times a day (QID) | INTRAMUSCULAR | Status: DC
  Administered 2013-02-05: 5 mg via INTRAVENOUS

## 2013-02-05 MED ORDER — SIMVASTATIN 40 MG PO TABS
40.0000 mg | ORAL_TABLET | Freq: Every evening | ORAL | Status: DC
  Administered 2013-02-05 – 2013-02-07 (×3): 40 mg via ORAL
  Filled 2013-02-05 (×4): qty 1

## 2013-02-05 MED ORDER — GLIMEPIRIDE 4 MG PO TABS
4.0000 mg | ORAL_TABLET | Freq: Every day | ORAL | Status: DC
  Administered 2013-02-06 – 2013-02-08 (×3): 4 mg via ORAL
  Filled 2013-02-05 (×5): qty 1

## 2013-02-05 MED ORDER — SODIUM CHLORIDE 0.9 % IV SOLN: 250.0000 mL | INTRAVENOUS | Status: DC | PRN

## 2013-02-05 MED ORDER — ALPRAZOLAM 0.5 MG PO TABS
0.5000 mg | ORAL_TABLET | Freq: Every evening | ORAL | Status: DC | PRN
  Filled 2013-02-05: qty 1

## 2013-02-05 MED ORDER — METOCLOPRAMIDE HCL 5 MG PO TABS
5.0000 mg | ORAL_TABLET | Freq: Three times a day (TID) | ORAL | Status: AC
  Administered 2013-02-05 – 2013-02-07 (×6): 5 mg via ORAL
  Filled 2013-02-05 (×8): qty 1

## 2013-02-05 MED ORDER — ATENOLOL 50 MG PO TABS
50.0000 mg | ORAL_TABLET | Freq: Every day | ORAL | Status: DC
  Administered 2013-02-05 – 2013-02-08 (×4): 50 mg via ORAL
  Filled 2013-02-05 (×5): qty 1

## 2013-02-05 MED ORDER — SODIUM CHLORIDE 0.9 % IJ SOLN
3.0000 mL | Freq: Two times a day (BID) | INTRAMUSCULAR | Status: DC
  Administered 2013-02-05 – 2013-02-07 (×2): 3 mL via INTRAVENOUS

## 2013-02-05 MED ORDER — OXYCODONE HCL 5 MG PO TABS
5.0000 mg | ORAL_TABLET | ORAL | Status: DC | PRN
  Administered 2013-02-05 – 2013-02-08 (×4): 5 mg via ORAL
  Filled 2013-02-05 (×4): qty 1

## 2013-02-05 MED ORDER — LISINOPRIL 20 MG PO TABS
20.0000 mg | ORAL_TABLET | Freq: Every day | ORAL | Status: DC
  Administered 2013-02-05 – 2013-02-08 (×4): 20 mg via ORAL
  Filled 2013-02-05 (×5): qty 1

## 2013-02-05 MED ORDER — OXYCODONE-ACETAMINOPHEN 5-325 MG PO TABS
1.0000 | ORAL_TABLET | ORAL | Status: DC | PRN
  Administered 2013-02-05 – 2013-02-08 (×9): 1 via ORAL
  Filled 2013-02-05 (×9): qty 1

## 2013-02-05 MED ORDER — SODIUM CHLORIDE 0.9 % IJ SOLN: 3.0000 mL | INTRAMUSCULAR | Status: DC | PRN

## 2013-02-05 NOTE — Progress Notes (Signed)
Ileus resolved Advancing diet  Possible discharge tomorrow or Sunday  [name]. Corliss Skains, MD, [facility] Surgery  General/ Trauma Surgery  02/05/2013 12:38 PM

## 2013-02-05 NOTE — Progress Notes (Signed)
Patient stated that a drainage was noted from wound site . Noted site to be wet but drainage appear to be clear, dry dressing applied and NP made aware. Will continue to monitor.

## 2013-02-05 NOTE — Progress Notes (Signed)
5 Days Post-Op  Subjective: Pt feeling much better today, less pain, having multiple BMs.    Objective: Vital signs in last 24 hours: Temp:  [98.2 F (36.8 C)-98.3 F (36.8 C)] 98.3 F (36.8 C) (10/31 0617) Pulse Rate:  [82-95] 82 (10/31 0617) Resp:  [18-20] 20 (10/31 0617) BP: (132-167)/(67-80) 151/73 mmHg (10/31 0617) SpO2:  [96 %-100 %] 100 % (10/31 0617) Last BM Date: 01/30/13  Intake/Output from previous day: 10/30 0701 - 10/31 0700 In: 2553.3 [I.V.:2403.3; IV Piggyback:150] Out: 350 [Emesis/NG output:350] Intake/Output this shift:    Physical Exam: General appearance: alert, cooperative and mild distress  Resp: clear to auscultation bilaterally  Cardio: regular rate and rhythm, S1, S2 normal, no murmur, click, rub or gallop  GI: +BS, obese, soft, mild diffuse tenderness.  Midline incision intact  Extremities: extremities normal, atraumatic, no cyanosis or edema   Lab Results:   Recent Labs  02/04/13 0940 02/05/13 0625  WBC 8.0 11.5*  HGB 9.3* 9.1*  HCT 28.5* 28.4*  PLT 369 434*   BMET  Recent Labs  02/03/13 0440 02/04/13 0940  NA 135 142  K 3.3* 3.6  CL 101 107  CO2 20 22  GLUCOSE 174* 137*  BUN 10 8  CREATININE 0.45* 0.41*  CALCIUM 8.0* 8.1*    Studies/Results: Ct Abdomen Pelvis W Contrast  02/04/2013   CLINICAL DATA:  Status post bowel resection and hernia repair on Sunday with continued pain extending from the epigastric region down into the left lower quadrant of the abdomen.  EXAM: CT ABDOMEN AND PELVIS WITH CONTRAST  TECHNIQUE: Multidetector CT imaging of the abdomen and pelvis was performed using the standard protocol following bolus administration of intravenous contrast.  CONTRAST:  OMNIPAQUE IOHEXOL 300 MG/ML  SOLN  COMPARISON:  CT of the abdomen and pelvis 01/31/2013.  FINDINGS: Lung Bases: Linear opacities throughout the lower lobes of the lungs bilaterally (right greater than left) may represent areas of subsegmental atelectasis  and/or scarring.  Abdomen/Pelvis: Nasogastric tube extends into the antral pre-pyloric region of the stomach. Diffuse low attenuation throughout the hepatic parenchyma, compatible with hepatic steatosis. The appearance of the gallbladder, pancreas, spleen, bilateral adrenal glands and the left kidney is unremarkable. 16 mm fatty attenuation lesion in the lower pole of the right kidney is similar to the prior study, compatible with an angiomyolipoma.  Postoperative changes associated with the anterior abdominal wall in the midline deep to the umbilicus, presumably related to recent umbilical hernia repair. There is some stranding, small amounts of fluid in small amounts of gas in these soft tissues, as well as immediately deep to the anterior abdominal wall musculature, presumably postoperative. No residual small bowel herniation is identified at this time. There is a trace volume of ascites and a small amount of mesenteric edema, but no significant volume of ascites. No pneumoperitoneum. No pathologic distention of small bowel. Postoperative changes of partial small bowel resection are noted with anastomosis in the lower aspect of the central abdomen. The anastomotic loops of small bowel demonstrate some mild wall thickening, likely to represent some postoperative edema. Normal appendix. The uterus and ovaries are unremarkable in appearance. Urinary bladder is normal in appearance.  Musculoskeletal: There are no aggressive appearing lytic or blastic lesions noted in the visualized portions of the skeleton.  IMPRESSION: 1. Postoperative changes related to recent partial small bowel resection with primary anastomosis and umbilical hernia repair, as above. There is a small amount of edema in the small bowel adjacent to the anastomosis,  and there is some surrounding mesenteric edema, as well as resolving postoperative fluid and gas collections in the anterior abdominal wall. No evidence of residual bowel obstruction,  recurrent hernia, or large abnormal postoperative fluid collections at this time. 2. Hepatic steatosis. 3. 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 4. Additional incidental findings, as above.   Electronically Signed   By: Trudie Reed M.D.   On: 02/04/2013 15:57   Dg Abd Portable 1v  02/04/2013   CLINICAL DATA:  Abdominal pain and distension.  EXAM: PORTABLE ABDOMEN - 1 VIEW  COMPARISON:  01/31/2013  FINDINGS: Nasogastric tube in the stomach. Abnormal dilated loops of small bowel up to 3.5 cm in diameter, primarily in the left upper quadrant. Skin clips project over the lower abdomen. There is some gas and formed stool in the proximal colon.  IMPRESSION: 1. Abnormal but nonspecific bowel gas pattern with moderately dilated mildly dilated loops of small bowel up to 3.5 cm. Differential diagnostic considerations include ileus or obstruction.   Electronically Signed   By: Herbie Baltimore M.D.   On: 02/04/2013 09:28    Anti-infectives: Anti-infectives   Start     Dose/Rate Route Frequency Ordered Stop   02/01/13 0800  piperacillin-tazobactam (ZOSYN) IVPB 3.375 g     3.375 g 12.5 mL/hr over 240 Minutes Intravenous Every 8 hours 02/01/13 0136     02/01/13 0315  piperacillin-tazobactam (ZOSYN) IVPB 3.375 g     3.375 g 100 mL/hr over 30 Minutes Intravenous  Once 02/01/13 0305 02/01/13 0528   01/31/13 2300  [MAR Hold]  ceFAZolin (ANCEF) IVPB 1 g/50 mL premix  Status:  Discontinued     (On MAR Hold since 01/31/13 2348)   1 g 100 mL/hr over 30 Minutes Intravenous  Once 01/31/13 2255 02/01/13 0155      Assessment/Plan: 1. Strangulated umbilical hernia, s/p Umbilical hernia repair with small bowel resection. 01/31/13 Dr. Chevis Pretty  Post op ileus  2. AODM  3. Hypertension  4. Dyslipidemia  5. Body mass index is 54.51  6. Post op ileus  POD #5 -repeat CT of abdomen is negative, better today.  WBC up today, repeat in AM -DC NGT and start clears -continue with reglan, prn  zofran  -DC IVF -Zosyn started 10/27 -start oral pain meds -resume home meds expect metformin(may resume tomorrow)  -continue with binder  -SCDs, heparin, mobilize as tolerated.  Dispo: anticipate discharge ~2 days   LOS: 5 days    Emersyn Kotarski ANP-BC 02/05/2013 8:42 AM

## 2013-02-05 NOTE — Progress Notes (Signed)
NG tube clamped 10/30 at 2200 with output noted in cannister.  NG to suction 10/31 from 0200-0400 with brownish/green clear output.  NG clamped again 10/31 at 0400.  Patient reports no problems with nausea or vomiting while the tube is clamped.

## 2013-02-06 DIAGNOSIS — T8149XA Infection following a procedure, other surgical site, initial encounter: Secondary | ICD-10-CM

## 2013-02-06 LAB — GLUCOSE, CAPILLARY: Glucose-Capillary: 141 mg/dL — ABNORMAL HIGH (ref 70–99)

## 2013-02-06 LAB — CBC
HCT: 27.5 % — ABNORMAL LOW (ref 36.0–46.0)
Hemoglobin: 9.4 g/dL — ABNORMAL LOW (ref 12.0–15.0)
MCH: 30.6 pg (ref 26.0–34.0)
MCHC: 34.2 g/dL (ref 30.0–36.0)
MCV: 89.6 fL (ref 78.0–100.0)
RBC: 3.07 MIL/uL — ABNORMAL LOW (ref 3.87–5.11)
WBC: 9.5 10*3/uL (ref 4.0–10.5)

## 2013-02-06 MED ORDER — OXYMETAZOLINE HCL 0.05 % NA SOLN
NASAL | Status: AC
  Filled 2013-02-06: qty 15

## 2013-02-06 NOTE — Progress Notes (Addendum)
6 Days Post-Op   Assessment: s/p Procedure(s): HERNIA REPAIR UMBILICAL ADULT SMALL BOWEL RESECTION Patient Active Problem List   Diagnosis Date Noted  . Umbilical hernia with gangrene and obstruction 02/06/2013    Progressing in terms of GI function;wound red, may need to be opened later today  Plan: Advance diet Will remove a staple or two later today to see if wound collection developing.  Subjective: Feels OK, just tired,, no nausea, tolerating clears and wants some solid food.  Objective: Vital signs in last 24 hours: Temp:  [98.2 F (36.8 C)-98.8 F (37.1 C)] 98.8 F (37.1 C) (11/01 0554) Pulse Rate:  [74-77] 77 (11/01 0554) Resp:  [18-19] 19 (11/01 0554) BP: (151-168)/(60-89) 168/65 mmHg (11/01 0554) SpO2:  [95 %-99 %] 95 % (11/01 0554)   Intake/Output from previous day: 10/31 0701 - 11/01 0700 In: 150 [IV Piggyback:150] Out: -   General appearance: alert, cooperative, no distress and mildly obese Resp: clear to auscultation bilaterally Cardio: regular rate and rhythm, S1, S2 normal, no murmur, click, rub or gallop GI: soft, not tender, BS+  Incision: Red for about 5" around midline with some serous appeaing drainagie on dressing, concerning for developing wound infection  Lab Results:   Recent Labs  02/05/13 0625 02/06/13 0540  WBC 11.5* 9.5  HGB 9.1* 9.4*  HCT 28.4* 27.5*  PLT 434* 407*   BMET  Recent Labs  02/04/13 0940  NA 142  K 3.6  CL 107  CO2 22  GLUCOSE 137*  BUN 8  CREATININE 0.41*  CALCIUM 8.1*    MEDS, Scheduled . atenolol  50 mg Oral Daily  . glimepiride  4 mg Oral QAC breakfast  . heparin  5,000 Units Subcutaneous Q8H  . insulin aspart  0-15 Units Subcutaneous TID WC  . lisinopril  20 mg Oral Daily  . metoCLOPramide  5 mg Oral TID AC  . pantoprazole (PROTONIX) IV  40 mg Intravenous Q12H  . piperacillin-tazobactam (ZOSYN)  IV  3.375 g Intravenous Q8H  . sertraline  200 mg Oral Daily  . simvastatin  40 mg Oral QPM  .  sodium chloride  1,000 mL Intravenous Once  . sodium chloride  3 mL Intravenous Q12H    Studies/Results: Ct Abdomen Pelvis W Contrast  02/04/2013   CLINICAL DATA:  Status post bowel resection and hernia repair on Sunday with continued pain extending from the epigastric region down into the left lower quadrant of the abdomen.  EXAM: CT ABDOMEN AND PELVIS WITH CONTRAST  TECHNIQUE: Multidetector CT imaging of the abdomen and pelvis was performed using the standard protocol following bolus administration of intravenous contrast.  CONTRAST:  OMNIPAQUE IOHEXOL 300 MG/ML  SOLN  COMPARISON:  CT of the abdomen and pelvis 01/31/2013.  FINDINGS: Lung Bases: Linear opacities throughout the lower lobes of the lungs bilaterally (right greater than left) may represent areas of subsegmental atelectasis and/or scarring.  Abdomen/Pelvis: Nasogastric tube extends into the antral pre-pyloric region of the stomach. Diffuse low attenuation throughout the hepatic parenchyma, compatible with hepatic steatosis. The appearance of the gallbladder, pancreas, spleen, bilateral adrenal glands and the left kidney is unremarkable. 16 mm fatty attenuation lesion in the lower pole of the right kidney is similar to the prior study, compatible with an angiomyolipoma.  Postoperative changes associated with the anterior abdominal wall in the midline deep to the umbilicus, presumably related to recent umbilical hernia repair. There is some stranding, small amounts of fluid in small amounts of gas in these soft tissues,  as well as immediately deep to the anterior abdominal wall musculature, presumably postoperative. No residual small bowel herniation is identified at this time. There is a trace volume of ascites and a small amount of mesenteric edema, but no significant volume of ascites. No pneumoperitoneum. No pathologic distention of small bowel. Postoperative changes of partial small bowel resection are noted with anastomosis in the lower  aspect of the central abdomen. The anastomotic loops of small bowel demonstrate some mild wall thickening, likely to represent some postoperative edema. Normal appendix. The uterus and ovaries are unremarkable in appearance. Urinary bladder is normal in appearance.  Musculoskeletal: There are no aggressive appearing lytic or blastic lesions noted in the visualized portions of the skeleton.  IMPRESSION: 1. Postoperative changes related to recent partial small bowel resection with primary anastomosis and umbilical hernia repair, as above. There is a small amount of edema in the small bowel adjacent to the anastomosis, and there is some surrounding mesenteric edema, as well as resolving postoperative fluid and gas collections in the anterior abdominal wall. No evidence of residual bowel obstruction, recurrent hernia, or large abnormal postoperative fluid collections at this time. 2. Hepatic steatosis. 3. 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 4. Additional incidental findings, as above.   Electronically Signed   By: Trudie Reed M.D.   On: 02/04/2013 15:57   Dg Abd Portable 1v  02/04/2013   CLINICAL DATA:  Abdominal pain and distension.  EXAM: PORTABLE ABDOMEN - 1 VIEW  COMPARISON:  01/31/2013  FINDINGS: Nasogastric tube in the stomach. Abnormal dilated loops of small bowel up to 3.5 cm in diameter, primarily in the left upper quadrant. Skin clips project over the lower abdomen. There is some gas and formed stool in the proximal colon.  IMPRESSION: 1. Abnormal but nonspecific bowel gas pattern with moderately dilated mildly dilated loops of small bowel up to 3.5 cm. Differential diagnostic considerations include ileus or obstruction.   Electronically Signed   By: Herbie Baltimore M.D.   On: 02/04/2013 09:28      LOS: 6 days     Currie Paris, MD, [facility] Surgery, [place] [phone]   02/06/2013 8:12 AM     Addendum: Removed staples and drained purulent  material. C&S done. Wound packed and will  start dressing changes. Fascia feels intact.Contnue antibiotics

## 2013-02-07 ENCOUNTER — Encounter (HOSPITAL_COMMUNITY): Payer: Self-pay | Admitting: Surgery

## 2013-02-07 MED ORDER — ALPRAZOLAM 0.5 MG PO TABS
0.5000 mg | ORAL_TABLET | Freq: Two times a day (BID) | ORAL | Status: DC | PRN
  Administered 2013-02-07 – 2013-02-08 (×2): 0.5 mg via ORAL
  Filled 2013-02-07: qty 1

## 2013-02-07 MED ORDER — PANTOPRAZOLE SODIUM 40 MG PO TBEC
40.0000 mg | DELAYED_RELEASE_TABLET | Freq: Two times a day (BID) | ORAL | Status: DC
  Administered 2013-02-07: 40 mg via ORAL
  Filled 2013-02-07: qty 1

## 2013-02-07 NOTE — Progress Notes (Signed)
7 Days Post-Op   Assessment: s/p Procedure(s): HERNIA REPAIR UMBILICAL ADULT SMALL BOWEL RESECTION Patient Active Problem List   Diagnosis Date Noted  . Postoperative wound infection 02/06/2013  . Umbilical hernia with gangrene and obstruction 01/31/2013    Wound infection,now opened; cultures pending  Plan: Continue local wound care, await culture report, may be able to go home tomorrow  Subjective: Feels better than yesterday, less pain.   Objective: Vital signs in last 24 hours: Temp:  [97.9 F (36.6 C)-98.2 F (36.8 C)] 98.2 F (36.8 C) (11/02 0546) Pulse Rate:  [70-79] 79 (11/02 0546) Resp:  [18] 18 (11/02 0546) BP: (157-161)/(62-72) 157/72 mmHg (11/02 0546) SpO2:  [98 %] 98 % (11/02 0546)   Intake/Output from previous day:    General appearance: alert, cooperative and no distress GI: Soft, not tender  Incision: Wound still with lots of purulent drainage, repacked today. Digital exam of wound I think the fascia is intact. Surrounding skin less red today than yesterday.  Lab Results:   Recent Labs  02/05/13 0625 02/06/13 0540  WBC 11.5* 9.5  HGB 9.1* 9.4*  HCT 28.4* 27.5*  PLT 434* 407*   BMET No results found for this basename: NA, K, CL, CO2, GLUCOSE, BUN, CREATININE, CALCIUM,  in the last 72 hours  MEDS, Scheduled . atenolol  50 mg Oral Daily  . glimepiride  4 mg Oral QAC breakfast  . heparin  5,000 Units Subcutaneous Q8H  . insulin aspart  0-15 Units Subcutaneous TID WC  . lisinopril  20 mg Oral Daily  . metoCLOPramide  5 mg Oral TID AC  . pantoprazole (PROTONIX) IV  40 mg Intravenous Q12H  . piperacillin-tazobactam (ZOSYN)  IV  3.375 g Intravenous Q8H  . sertraline  200 mg Oral Daily  . simvastatin  40 mg Oral QPM  . sodium chloride  1,000 mL Intravenous Once  . sodium chloride  3 mL Intravenous Q12H    Studies/Results: No results found.    LOS: 7 days     Currie Paris, MD, [facility] Surgery,  [place] [phone]   02/07/2013 9:48 AM

## 2013-02-07 NOTE — Progress Notes (Signed)
PHARMACIST - PHYSICIAN COMMUNICATION DR:  CCS Team CONCERNING: Protonix IV to Oral Route Change Policy  RECOMMENDATION: This patient is receiving Protonix by the intravenous route.  Based on criteria approved by the Pharmacy and Therapeutics Committee, this drug is being converted to the equivalent oral dose form(s).  DESCRIPTION: These criteria include:  The patient is eating (either orally or via tube) and/or has been taking other orally administered medications for a least 24 hours  There is no active GI bleed or impaired GI absorption noted.   If you have questions about this conversion, please contact the Pharmacy Department  []   [phone] )  [facility] [x]   [phone] )  [facility]  []   [phone] )  [facility] []   [phone] )  [facility]    [place]ina Pillion, PharmD, BCPS Clinical Pharmacist Pager: [phone] 02/07/2013 10:20 AM

## 2013-02-08 LAB — CBC
HCT: 28.2 % — ABNORMAL LOW (ref 36.0–46.0)
Hemoglobin: 9.3 g/dL — ABNORMAL LOW (ref 12.0–15.0)
MCH: 29 pg (ref 26.0–34.0)
MCHC: 33 g/dL (ref 30.0–36.0)
MCV: 87.9 fL (ref 78.0–100.0)
Platelets: 431 10*3/uL — ABNORMAL HIGH (ref 150–400)
RBC: 3.21 MIL/uL — ABNORMAL LOW (ref 3.87–5.11)
WBC: 7.8 10*3/uL (ref 4.0–10.5)

## 2013-02-08 LAB — GLUCOSE, CAPILLARY
Glucose-Capillary: 112 mg/dL — ABNORMAL HIGH (ref 70–99)
Glucose-Capillary: 130 mg/dL — ABNORMAL HIGH (ref 70–99)
Glucose-Capillary: 141 mg/dL — ABNORMAL HIGH (ref 70–99)

## 2013-02-08 MED ORDER — AMOXICILLIN-POT CLAVULANATE 875-125 MG PO TABS: 1.0000 | ORAL_TABLET | Freq: Two times a day (BID) | ORAL | Status: DC

## 2013-02-08 MED ORDER — OXYCODONE-ACETAMINOPHEN 5-325 MG PO TABS: 1.0000 | ORAL_TABLET | ORAL | Status: DC | PRN

## 2013-02-08 NOTE — Care Management Note (Signed)
  Page 1 of 1   02/08/2013     9:57:44 AM   CARE MANAGEMENT NOTE 02/08/2013  Patient:  Lauren, Schmidt   Account Number:  [id number]  Date Initiated:  02/08/2013  Documentation initiated by:  Ronny Flurry  Subjective/Objective Assessment:     Action/Plan:   Anticipated DC Date:  02/08/2013   Anticipated DC Plan:  HOME W HOME HEALTH SERVICES         Choice offered to / List presented to:  C-1 Patient        HH arranged  HH-1 RN      [facility] agency  [facility]   Status of service:  Completed, signed off Medicare Important Message given?   (If response is "NO", the following Medicare IM given date fields will be blank) Date Medicare IM given:   Date Additional Medicare IM given:    Discharge Disposition:    Per UR Regulation:    If discussed at Long Length of Stay Meetings, dates discussed:    Comments:

## 2013-02-08 NOTE — Discharge Summary (Signed)
Physician Discharge Summary  JENITA RAYFIELD RUE:[id number] DOB: [birth year] DOA: 01/31/2013  PCP: Lupe Carney, MD  Consultation: NONE  Admit date: 01/31/2013 Discharge date: 02/08/2013  Recommendations for Outpatient Follow-up:  1. Home health nursing for BID wet to dry dressing changes  Follow-up Information   Follow up with Robyne Askew, MD On 02/15/2013. (APPT TIME: 9:40AM.  ARRIVE BY 9:20.  THIS IS WITH A NURSE)    Specialty:  General Surgery   Contact information:   [address] [place] [address] [phone]       Follow up with Robyne Askew, MD On 02/23/2013. (APPT TIME: 11:20AM.  PLEASE ARRIVE BY 11AM)    Specialty:  General Surgery   Contact information:   [address] [place] [address] [phone]      Discharge Diagnoses:  1. Incarcerated umbilical hernia 2. Small bowel obstruction 3. Hypertension 4. Diabetes mellitus 5. Post operative ileus 6. Post operative wound infection   Surgical Procedure: Umbilical hernia repair with small bowel resection. 01/31/13 Dr. Chevis Pretty    Discharge Condition: stable Disposition: home  Diet recommendation: carb modified   Filed Weights   02/01/13 0251  Weight: 279 lb 1.6 oz (126.599 kg)     Filed Vitals:   02/08/13 0504  BP: 134/66  Pulse: 70  Temp: 98.2 F (36.8 C)  Resp: 20     Hospital Course:  Christyl Osentoski presented to [facility] with abdominal pain, nausea and vomiting.  She was found to have a small bowel obstruction caused by a incarcerated umbilical hernia.  She underwent a urgent exploratory laparotomy with hernia repair and small bowel resection.  Post operatively, she was slow to progress.  She developed an ileus treated with NGT and bowel rest.  A CT scan was repeated to ensure not abscess, which was negative.  She was mobilized.  Her bowel function returned and she was started on a diet.  She had elevated blood pressure treated with IV metoprolol.  This improved  once she was able to resume her home meds.  Diabetes treated with insulin and home meds were subsequently resumed.  She was treated with IV antibiotics, unfortunately, she developed a wound infection.  Her wound was opened and cultured.  It was changed BID which she will continue with help of family and home health.  She was placed on augmentin for gram negative coverage, final cultures and sensitivity is pending.  Her white count remained stable.  She had post operative anemia, but remained stable.  On POD #8 she was tolerating a diet, having BMs, pain was well controlled.  She was therefore felt stable for discharge.  She still had min purulent drainage, I stressed the importance of BID dressing changes.  She was provided with a nurse visit on Monday to have the remainder staples removed.  She was provided with a follow up with Dr. Carolynne Edouard the following well.  We discussed warning signs that warrant immediate attention.  I encouraged her to call with any questions or concerns.     Discharge Instructions   Future Appointments Provider Department Dept Phone   02/15/2013 9:40 AM [facility] Nurse [facility], [place] [phone]   02/23/2013 11:20 AM Robyne Askew, MD [facility] Surgery, [place] [phone]       Medication List         ALPRAZolam 0.5 MG tablet  Commonly known as:  XANAX  Take 0.5 mg by mouth at bedtime  as needed for sleep or anxiety.     amoxicillin-clavulanate 875-125 MG per tablet  Commonly known as:  AUGMENTIN  Take 1 tablet by mouth 2 (two) times daily.     atenolol 50 MG tablet  Commonly known as:  TENORMIN  Take 50 mg by mouth daily.     fenofibrate 145 MG tablet  Commonly known as:  TRICOR  Take 145 mg by mouth daily.     glimepiride 4 MG tablet  Commonly known as:  AMARYL  Take 4 mg by mouth daily before breakfast.     lisinopril 20 MG tablet  Commonly known as:  PRINIVIL,ZESTRIL  Take 20 mg by mouth daily.     metFORMIN 500 MG 24 hr tablet   Commonly known as:  GLUCOPHAGE-XR  Take 1,000 mg by mouth 2 (two) times daily.     omega-3 acid ethyl esters 1 G capsule  Commonly known as:  LOVAZA  Take 2 g by mouth 2 (two) times daily.     oxyCODONE-acetaminophen 5-325 MG per tablet  Commonly known as:  PERCOCET/ROXICET  Take 1-2 tablets by mouth every 4 (four) hours as needed.     simvastatin 40 MG tablet  Commonly known as:  ZOCOR  Take 40 mg by mouth every evening.           Follow-up Information   Follow up with Robyne Askew, MD On 02/15/2013. (APPT TIME: 9:40AM.  ARRIVE BY 9:20.  THIS IS WITH A NURSE)    Specialty:  General Surgery   Contact information:   [address] [place] [address] [phone]       Follow up with Robyne Askew, MD On 02/23/2013. (APPT TIME: 11:20AM.  PLEASE ARRIVE BY 11AM)    Specialty:  General Surgery   Contact information:   [address] [place] [address] [phone]        The results of significant diagnostics from this hospitalization (including imaging, microbiology, ancillary and laboratory) are listed below for reference.    Significant Diagnostic Studies: Ct Abdomen Pelvis W Contrast  02/04/2013   CLINICAL DATA:  Status post bowel resection and hernia repair on Sunday with continued pain extending from the epigastric region down into the left lower quadrant of the abdomen.  EXAM: CT ABDOMEN AND PELVIS WITH CONTRAST  TECHNIQUE: Multidetector CT imaging of the abdomen and pelvis was performed using the standard protocol following bolus administration of intravenous contrast.  CONTRAST:  OMNIPAQUE IOHEXOL 300 MG/ML  SOLN  COMPARISON:  CT of the abdomen and pelvis 01/31/2013.  FINDINGS: Lung Bases: Linear opacities throughout the lower lobes of the lungs bilaterally (right greater than left) may represent areas of subsegmental atelectasis and/or scarring.  Abdomen/Pelvis: Nasogastric tube extends into the antral pre-pyloric region of the  stomach. Diffuse low attenuation throughout the hepatic parenchyma, compatible with hepatic steatosis. The appearance of the gallbladder, pancreas, spleen, bilateral adrenal glands and the left kidney is unremarkable. 16 mm fatty attenuation lesion in the lower pole of the right kidney is similar to the prior study, compatible with an angiomyolipoma.  Postoperative changes associated with the anterior abdominal wall in the midline deep to the umbilicus, presumably related to recent umbilical hernia repair. There is some stranding, small amounts of fluid in small amounts of gas in these soft tissues, as well as immediately deep to the anterior abdominal wall musculature, presumably postoperative. No residual small bowel herniation is identified at this time. There is  a trace volume of ascites and a small amount of mesenteric edema, but no significant volume of ascites. No pneumoperitoneum. No pathologic distention of small bowel. Postoperative changes of partial small bowel resection are noted with anastomosis in the lower aspect of the central abdomen. The anastomotic loops of small bowel demonstrate some mild wall thickening, likely to represent some postoperative edema. Normal appendix. The uterus and ovaries are unremarkable in appearance. Urinary bladder is normal in appearance.  Musculoskeletal: There are no aggressive appearing lytic or blastic lesions noted in the visualized portions of the skeleton.  IMPRESSION: 1. Postoperative changes related to recent partial small bowel resection with primary anastomosis and umbilical hernia repair, as above. There is a small amount of edema in the small bowel adjacent to the anastomosis, and there is some surrounding mesenteric edema, as well as resolving postoperative fluid and gas collections in the anterior abdominal wall. No evidence of residual bowel obstruction, recurrent hernia, or large abnormal postoperative fluid collections at this time. 2. Hepatic steatosis.  3. 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 4. Additional incidental findings, as above.   Electronically Signed   By: Trudie Reed M.D.   On: 02/04/2013 15:57   Ct Abdomen Pelvis W Contrast  01/31/2013   CLINICAL DATA:  Abdominal pain, vomiting.  EXAM: CT ABDOMEN AND PELVIS WITH CONTRAST  TECHNIQUE: Multidetector CT imaging of the abdomen and pelvis was performed using the standard protocol following bolus administration of intravenous contrast.  CONTRAST:  OMNIPAQUE IOHEXOL 300 MG/ML  SOLN  COMPARISON:  None.  FINDINGS: Mild dependent atelectasis in the visualized lung bases. Unremarkable liver, nondilated gallbladder, spleen, adrenal glands, pancreas, left kidney. 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 No hydronephrosis or nephrolithiasis. Unremarkable aorta. Stomach is physiologically distended.  Multiple distended proximal small bowel loops. There is an umbilical hernia containing a loop of small bowel which appears to represent a transition point to decompressed distal small bowel. There is a small amount of extraluminal fluid around the loops in the umbilical hernia suggesting incarceration or strangulation. The colon is nondilated. There is a small amount of pelvic ascites. . No free air. Urinary bladder incompletely distended. Uterus and adnexal regions unremarkable. Portal vein patent. No adenopathy localized. Facet degenerative changes in the lower lumbar spine.  IMPRESSION: 1. Small bowel obstruction secondary to involvement in a small umbilical hernia. 2. Small amount of pelvic ascites.   Electronically Signed   By: Oley Balm M.D.   On: 01/31/2013 21:30   Dg Abd Portable 1v  02/04/2013   CLINICAL DATA:  Abdominal pain and distension.  EXAM: PORTABLE ABDOMEN - 1 VIEW  COMPARISON:  01/31/2013  FINDINGS: Nasogastric tube in the stomach. Abnormal dilated loops of small bowel up to 3.5 cm in diameter,  primarily in the left upper quadrant. Skin clips project over the lower abdomen. There is some gas and formed stool in the proximal colon.  IMPRESSION: 1. Abnormal but nonspecific bowel gas pattern with moderately dilated mildly dilated loops of small bowel up to 3.5 cm. Differential diagnostic considerations include ileus or obstruction.   Electronically Signed   By: Herbie Baltimore M.D.   On: 02/04/2013 09:28    Microbiology: Recent Results (from the past 240 hour(s))  WOUND CULTURE     Status: None   Collection Time    02/06/13  9:29 AM      Result Value Range Status   Specimen Description WOUND  ABDOMEN   Final   Special Requests NONE   Final   Gram Stain     Final   Value: ABUNDANT WBC PRESENT, PREDOMINANTLY PMN     NO SQUAMOUS EPITHELIAL CELLS SEEN     RARE GRAM NEGATIVE RODS     RARE GRAM POSITIVE COCCI IN PAIRS     Performed at [facility]   Culture     Final   Value: MODERATE GRAM NEGATIVE RODS     Performed at [facility]   Report Status PENDING   Incomplete     Labs: Basic Metabolic Panel:  Recent Labs Lab 02/03/13 0440 02/04/13 0940  NA 135 142  K 3.3* 3.6  CL 101 107  CO2 20 22  GLUCOSE 174* 137*  BUN 10 8  CREATININE 0.45* 0.41*  CALCIUM 8.0* 8.1*   Liver Function Tests: No results found for this basename: AST, ALT, ALKPHOS, BILITOT, PROT, ALBUMIN,  in the last 168 hours No results found for this basename: LIPASE, AMYLASE,  in the last 168 hours No results found for this basename: AMMONIA,  in the last 168 hours CBC:  Recent Labs Lab 02/03/13 0440 02/04/13 0940 02/05/13 0625 02/06/13 0540 02/08/13 0454  WBC 7.0 8.0 11.5* 9.5 7.8  HGB 10.0* 9.3* 9.1* 9.4* 9.3*  HCT 30.4* 28.5* 28.4* 27.5* 28.2*  MCV 91.3 90.5 91.6 89.6 87.9  PLT 330 369 434* 407* 431*   Cardiac Enzymes: No results found for this basename: CKTOTAL, CKMB, CKMBINDEX, TROPONINI,  in the last 168 hours BNP: BNP (last 3 results) No results found for this basename:  PROBNP,  in the last 8760 hours CBG:  Recent Labs Lab 02/07/13 0900 02/07/13 1200 02/07/13 1711 02/08/13 0002 02/08/13 0746  GLUCAP 112* 175* 133* 130* 141*    Principal Problem:   Umbilical hernia with gangrene and obstruction Active Problems:   Postoperative wound infection   Time coordinating discharge: <30 mins  Signed:  Jakori Burkett, ANP-BC

## 2013-02-08 NOTE — Discharge Summary (Signed)
I have seen and examined the patient and agree with the assessment and plans.  Ilanna Deihl A. Maricarmen Braziel  MD, FACS  

## 2013-02-09 ENCOUNTER — Telehealth (INDEPENDENT_AMBULATORY_CARE_PROVIDER_SITE_OTHER): Payer: Self-pay | Admitting: General Surgery

## 2013-02-09 LAB — WOUND CULTURE

## 2013-02-09 MED ORDER — SULFAMETHOXAZOLE-TRIMETHOPRIM 800-160 MG PO TABS: 1.0000 | ORAL_TABLET | Freq: Two times a day (BID) | ORAL | Status: DC

## 2013-02-09 NOTE — Telephone Encounter (Signed)
Culture shows e coli, resistant to augmentin.  Change to septra DS.  Sent to [facility] on file.  Left message on pvt voicemail to call back to discuss the results.  Laylanie Kruczek, ANP-BC

## 2013-02-09 NOTE — Telephone Encounter (Signed)
Pt returned call to [facility]. I reviewed msg with pt re; change of antibiotic and Cdiff result. Emina paged given pts phone # to called pt back.

## 2013-02-10 NOTE — Telephone Encounter (Signed)
Results discussed, rx resent to correct pharmacy

## 2013-02-15 ENCOUNTER — Ambulatory Visit (INDEPENDENT_AMBULATORY_CARE_PROVIDER_SITE_OTHER): Payer: BC Managed Care – PPO

## 2013-02-15 DIAGNOSIS — Z5189 Encounter for other specified aftercare: Secondary | ICD-10-CM

## 2013-02-15 NOTE — Progress Notes (Signed)
Pt comes in today s/p incarcerated umb hernia repair.  I removed the existing dressing and packing.  There was a pool of fluid in the wound, but otherwise the wound looked good.  I sought Dr. Fatima Sanger advise.  Dr. Corliss Skains removed the 2 staples and dried the wound.  He advised the pt that she needs to make sure that when she packs the wound herself at home that she needs to get the gauze all the way to the bottom of the wound.  Pt verbalized understanding.  I then repacked the wound with saline soaked gauze and dressed with dry gauze and an ABD pad.  Pt has appointment to see Dr. Carolynne Edouard next week.

## 2013-02-18 ENCOUNTER — Encounter (INDEPENDENT_AMBULATORY_CARE_PROVIDER_SITE_OTHER): Payer: Self-pay

## 2013-02-23 ENCOUNTER — Ambulatory Visit (INDEPENDENT_AMBULATORY_CARE_PROVIDER_SITE_OTHER): Payer: BC Managed Care – PPO | Admitting: General Surgery

## 2013-02-23 ENCOUNTER — Encounter (INDEPENDENT_AMBULATORY_CARE_PROVIDER_SITE_OTHER): Payer: Self-pay | Admitting: General Surgery

## 2013-02-23 VITALS — BP 122/74 | HR 86 | Temp 98.0°F | Resp 14 | Ht 60.0 in | Wt 265.4 lb

## 2013-02-23 DIAGNOSIS — K421 Umbilical hernia with gangrene: Secondary | ICD-10-CM

## 2013-02-23 NOTE — Patient Instructions (Signed)
Must pack gauze all the way to the bottom of the wound

## 2013-02-24 NOTE — Progress Notes (Signed)
Subjective:     Patient ID: Lauren Schmidt, female   DOB: Jan 12, 1961, 52 y.o.   MRN: [id number]  HPI The pt is a 51yo wf who is 2-3 weeks s/p small bowel resection and ventral hernia repair. Her postop course was complicated by a superficial wound infection. Her only complaint today is of persistent drainage from the wound. She denies any fever or chill. Her appetite is good and her bowels are moving normally  Review of Systems     Objective:   Physical Exam On exam her abdomen is soft and nontender. Her wound is clean but there is fluid deep in the bottom of the wound that is not being packed    Assessment:     The pt is 2-3 weeks s/p small bowel resection and ventral hernia repair     Plan:     At this point I think the drainage will stop if the wound can be packed all the way to the bottom. I have instructed her on dressing changes and I will see her back later this week for a wound check

## 2013-02-25 ENCOUNTER — Telehealth (INDEPENDENT_AMBULATORY_CARE_PROVIDER_SITE_OTHER): Payer: Self-pay

## 2013-02-25 NOTE — Telephone Encounter (Signed)
Pt calling to report that she has got a nurse at her work to pack the wound for her now instead of her trying to do it herself. The nurse is going to pack it daily with the plain packing strips. I advised pt that she would need to still keep her appt with Dr Carolynne Edouard next week. The pt understands.

## 2013-02-26 ENCOUNTER — Encounter (INDEPENDENT_AMBULATORY_CARE_PROVIDER_SITE_OTHER): Payer: BC Managed Care – PPO

## 2013-03-03 ENCOUNTER — Ambulatory Visit (INDEPENDENT_AMBULATORY_CARE_PROVIDER_SITE_OTHER): Payer: BC Managed Care – PPO | Admitting: General Surgery

## 2013-03-03 ENCOUNTER — Encounter (INDEPENDENT_AMBULATORY_CARE_PROVIDER_SITE_OTHER): Payer: Self-pay | Admitting: General Surgery

## 2013-03-03 VITALS — BP 132/84 | HR 84 | Temp 98.2°F | Resp 14 | Ht 60.0 in | Wt 268.2 lb

## 2013-03-03 DIAGNOSIS — Z5189 Encounter for other specified aftercare: Secondary | ICD-10-CM

## 2013-03-03 DIAGNOSIS — K421 Umbilical hernia with gangrene: Secondary | ICD-10-CM

## 2013-03-03 NOTE — Patient Instructions (Signed)
Continue dressing changes.  ?

## 2013-03-05 ENCOUNTER — Ambulatory Visit
Admission: RE | Admit: 2013-03-05 | Discharge: 2013-03-05 | Disposition: A | Discharge status: Home / Self Care | Payer: BC Managed Care – PPO | Source: Ambulatory Visit | Attending: General Surgery | Admitting: General Surgery

## 2013-03-05 ENCOUNTER — Telehealth (INDEPENDENT_AMBULATORY_CARE_PROVIDER_SITE_OTHER): Payer: Self-pay | Admitting: Surgery

## 2013-03-05 DIAGNOSIS — K421 Umbilical hernia with gangrene: Secondary | ICD-10-CM

## 2013-03-05 MED ORDER — IOHEXOL 300 MG/ML  SOLN
100.0000 mL | Freq: Once | INTRAMUSCULAR | Status: AC | PRN
  Administered 2013-03-05: 100 mL via INTRAVENOUS

## 2013-03-05 NOTE — Telephone Encounter (Signed)
CT scan results called to me from [facility].  I called patient with results.  She is doing fine, performing dressing changes, mild pain.  Will forward CT scan results to Dr. Carolynne Edouard for review.  I told patient he would contact her in the morning as he is on call here at [facility].  Velora Heckler, MD, [facility] Office: [phone]

## 2013-03-08 NOTE — Telephone Encounter (Signed)
Patient called to see if Dr Carolynne Edouard has reviewed CT results. Please call asap. Patient anxious for results. [phone].

## 2013-03-09 ENCOUNTER — Telehealth (INDEPENDENT_AMBULATORY_CARE_PROVIDER_SITE_OTHER): Payer: Self-pay | Admitting: General Surgery

## 2013-03-09 NOTE — Telephone Encounter (Signed)
Pt called for CT results (was told there was a abscess) and plan for it.  Informed pt, per Dr. Carolynne Edouard, the abscess was not in the intra-abdominal wall , so no percutaneous drain needed.  Instructed to keep performing dressings as before.  She denies fever, but has constant pain.  She is not on an antibiotic and inquired if she needed to be on one?  Call to [facility] (in demographics.)

## 2013-03-11 NOTE — Telephone Encounter (Signed)
LMOM> No abx needed.

## 2013-03-18 ENCOUNTER — Encounter (INDEPENDENT_AMBULATORY_CARE_PROVIDER_SITE_OTHER): Payer: Self-pay | Admitting: General Surgery

## 2013-03-18 ENCOUNTER — Ambulatory Visit (INDEPENDENT_AMBULATORY_CARE_PROVIDER_SITE_OTHER): Payer: BC Managed Care – PPO | Admitting: General Surgery

## 2013-03-18 VITALS — BP 128/82 | HR 60 | Temp 97.8°F | Resp 16 | Ht 60.0 in | Wt 272.4 lb

## 2013-03-18 DIAGNOSIS — K421 Umbilical hernia with gangrene: Secondary | ICD-10-CM

## 2013-03-18 NOTE — Patient Instructions (Signed)
Continue dressing changes with nu gauze

## 2013-03-18 NOTE — Progress Notes (Signed)
Subjective:     Patient ID: Lauren Schmidt, female   DOB: [birth year], 52 y.o.   MRN: [id number]  HPI The patient is a 52 year old white female who is about 6 weeks status post small bowel resection from a strangulated umbilical hernia. Her course has been complicated by a superficial wound infection. She has been getting quarter-inch Nu Gauze to the bottom of the wound and things seem to be improving. The drainage is less. Good and her bowels are working normally.  Review of Systems     Objective:   Physical Exam On exam the wound is very clean. There is drainage on the gauze but I do not see any standing fluid at the bottom of the wound. I repacked the wound with gauze today and she tolerated this well.    Assessment:     The patient is 6 weeks status post small bowel resection and umbilical hernia repair complicated by a wound infection     Plan:     At this point she will continue to do daily dressing changes. Our plan to see her back in about 3-4 weeks to check her progress

## 2013-04-19 ENCOUNTER — Ambulatory Visit (INDEPENDENT_AMBULATORY_CARE_PROVIDER_SITE_OTHER): Payer: BC Managed Care – PPO | Admitting: General Surgery

## 2013-04-19 ENCOUNTER — Encounter (INDEPENDENT_AMBULATORY_CARE_PROVIDER_SITE_OTHER): Payer: Self-pay | Admitting: General Surgery

## 2013-04-19 VITALS — BP 134/82 | Ht 60.0 in | Wt 274.6 lb

## 2013-04-19 DIAGNOSIS — K421 Umbilical hernia with gangrene: Secondary | ICD-10-CM

## 2013-04-19 NOTE — Patient Instructions (Signed)
Continue packing once a day

## 2013-04-19 NOTE — Progress Notes (Signed)
Subjective:     Patient ID: Lauren BrooksLinda A Schmidt, female   DOB: 12/22/1960, 53 y.o.   MRN: [id number]  HPI The patient is a 53 year old white female who is 2-1/2 months status post ventral hernia repair for an incarcerated hernia. Her postoperative course was complicated by a superficial wound infection which she has been packing. She is middle of progress since her last visit. She denies any abdominal pain. She denies any fevers or chills.  Review of Systems     Objective:   Physical Exam On exam her abdomen is soft and nontender. Her incision is very clean and only the size of a Q-tip at this point. Unfortunately it does go about 6 or 7 cm deep    Assessment:     The patient is 2-1/2 months status post ventral hernia repair     Plan:     At this point she will continue to do daily dressing changes. I will plan to see her back in one month to check her progress

## 2013-05-04 NOTE — Progress Notes (Signed)
Subjective:     Patient ID: Lauren BrooksLinda A Hern, female   DOB: 11/14/1960, [age] y.o.   MRN: [id number]  HPI The patient is a 53 year old white female who is about one month status post small bowel resection and ventral hernia repair. Her postoperative course has been complicated by superficial wound infection. She has been trying to pack the gauze deeper into the wound to make sure It all gets strained. Otherwise she has no complaints  Review of Systems     Objective:   Physical Exam On exam her wound is open and relatively clean. There is still some purulent drainage deep in the wound. We packed gauze all the way to the bottom and she tolerated this well.     Assessment:     The patient is one month status post small bowel resection and ventral hernia repair complicated by superficial wound infection     Plan:     At this point she will continue to try to pack the gauze all the way to the bottom of the wound twice a day. She will continue to shower daily. We will plan to see her back in the next couple weeks for a wound check

## 2013-05-18 ENCOUNTER — Encounter (INDEPENDENT_AMBULATORY_CARE_PROVIDER_SITE_OTHER): Payer: Self-pay | Admitting: General Surgery

## 2013-05-18 ENCOUNTER — Ambulatory Visit (INDEPENDENT_AMBULATORY_CARE_PROVIDER_SITE_OTHER): Payer: BC Managed Care – PPO | Admitting: General Surgery

## 2013-05-18 VITALS — BP 150/100 | HR 84 | Temp 98.5°F | Resp 15 | Ht 60.0 in | Wt 273.6 lb

## 2013-05-18 DIAGNOSIS — T8149XA Infection following a procedure, other surgical site, initial encounter: Secondary | ICD-10-CM

## 2013-05-18 DIAGNOSIS — T8140XA Infection following a procedure, unspecified, initial encounter: Secondary | ICD-10-CM

## 2013-05-18 NOTE — Progress Notes (Signed)
Subjective:     Patient ID: Lauren LofflerLinda Schmidt, female   DOB: 08/18/1960, 53 y.o.   MRN: [id number]  HPI The patient is a 53 year old white female who is 3-1/2 months status post small bowel resection and ventral hernia repair for a strangulated ventral hernia. Her postoperative course has been complicated by a superficial wound infection. She has continued to pack the wound daily. The wound has gotten significantly smaller. She is having minimal drainage from the area  Review of Systems     Objective:   Physical Exam On exam her abdomen is soft and nontender. The open wound is down to a 4-5 mm opening But still has about a 5-6 cm track. The wound is clean. There is minimal drainage. The wound was repacked with new gauze.    Assessment:     The patient's 3 and 1/2 months status post a ventral hernia repair and small bowel resection complicated by a superficial wound infection     Plan:     At this point She will continue to shower daily and packed the wound with Nu Gauze. We will plan to see her back in one month to check her progress

## 2013-05-18 NOTE — Patient Instructions (Signed)
Continue packing daily 

## 2013-06-15 ENCOUNTER — Encounter (INDEPENDENT_AMBULATORY_CARE_PROVIDER_SITE_OTHER): Payer: Self-pay | Admitting: General Surgery

## 2013-06-15 ENCOUNTER — Ambulatory Visit (INDEPENDENT_AMBULATORY_CARE_PROVIDER_SITE_OTHER): Payer: BC Managed Care – PPO | Admitting: General Surgery

## 2013-06-15 VITALS — BP 140/80 | HR 78 | Temp 97.8°F | Resp 16 | Ht 60.0 in | Wt 271.0 lb

## 2013-06-15 DIAGNOSIS — T8149XA Infection following a procedure, other surgical site, initial encounter: Secondary | ICD-10-CM

## 2013-06-15 DIAGNOSIS — K421 Umbilical hernia with gangrene: Secondary | ICD-10-CM

## 2013-06-15 DIAGNOSIS — T8140XA Infection following a procedure, unspecified, initial encounter: Secondary | ICD-10-CM

## 2013-06-15 NOTE — Patient Instructions (Signed)
Continue daily dressing changes

## 2013-06-15 NOTE — Progress Notes (Signed)
Subjective:     Patient ID: Lauren LofflerLinda Schmidt, female   DOB: 04/03/1961, 53 y.o.   MRN: [id number]  HPI The patient is a 53 year old white female who is about 4 months status post primary ventral hernia repair because of an incarcerated umbilical hernia. Her postoperative course was complicated by a superficial wound infection. She has continued to do dressing changes to this area. It has been slow to heal but is definitely making some progress. She denies any abdominal pain. Her appetite is good and her bowels are working normally  Review of Systems     Objective:   Physical Exam On exam her abdomen is soft and nontender. Along the midportion of the wound there is a small open area just large enough for the tip of the Q-tip to go through. This opening runs about 4-5 cm deep but does not tunnel. The wound is very clean.    Assessment:     The patient is 4 months status post ventral hernia repair with a superficial wound infection     Plan:     At this point she will continue to do daily dressing changes. Hopefully it will heal soon. We will plan to see her back in another month to check the wound.

## 2013-07-13 ENCOUNTER — Ambulatory Visit (INDEPENDENT_AMBULATORY_CARE_PROVIDER_SITE_OTHER): Payer: BC Managed Care – PPO | Admitting: General Surgery

## 2013-07-13 ENCOUNTER — Encounter (INDEPENDENT_AMBULATORY_CARE_PROVIDER_SITE_OTHER): Payer: Self-pay | Admitting: General Surgery

## 2013-07-13 VITALS — BP 134/78 | HR 77 | Temp 97.8°F | Ht 60.0 in | Wt 274.8 lb

## 2013-07-13 DIAGNOSIS — T8149XA Infection following a procedure, other surgical site, initial encounter: Secondary | ICD-10-CM

## 2013-07-13 DIAGNOSIS — T8140XA Infection following a procedure, unspecified, initial encounter: Secondary | ICD-10-CM

## 2013-07-13 DIAGNOSIS — K421 Umbilical hernia with gangrene: Secondary | ICD-10-CM

## 2013-07-13 NOTE — Patient Instructions (Signed)
Continue to shower and pack wound daily

## 2013-07-13 NOTE — Progress Notes (Signed)
Subjective:     Patient ID: Lauren LofflerLinda Schmidt, female   DOB: 07/14/1960, 53 y.o.   MRN: [id number]  HPI The patient is a 53 year old white female who is about 5 months status post umbilical hernia repair. Her postoperative course was complicated by a superficial wound infection. She has been caring for the wound in over the last couple months it has not really changed a whole lot. The wound has been very clean with no drainage But it will not finished closing up. She denies any abdominal pain. Her appetite is good and her bowels are working normally.  Review of Systems     Objective:   Physical Exam On exam her abdomen is soft and nontender. The open portion of the wound is just big enough to get the tip of a Q-tip into. The wound is very clean with no drainage. It measures about 4-5 cm deep with no tunneling    Assessment:     The patient is 5 months status post umbilical hernia repair with an open wound     Plan:     At this point she will continue to shower daily and pack the wound. I will plan to see her back in another month to check her progress.

## 2013-08-10 ENCOUNTER — Ambulatory Visit (INDEPENDENT_AMBULATORY_CARE_PROVIDER_SITE_OTHER): Payer: BC Managed Care – PPO | Admitting: General Surgery

## 2013-08-10 ENCOUNTER — Encounter (INDEPENDENT_AMBULATORY_CARE_PROVIDER_SITE_OTHER): Payer: Self-pay | Admitting: General Surgery

## 2013-08-10 VITALS — BP 126/78 | HR 75 | Temp 97.3°F | Resp 14 | Ht 60.0 in | Wt 277.6 lb

## 2013-08-10 DIAGNOSIS — K421 Umbilical hernia with gangrene: Secondary | ICD-10-CM

## 2013-08-10 NOTE — Progress Notes (Signed)
Subjective:     Patient ID: Lauren LofflerLinda Schmidt, female   DOB: 07/01/1960, 53 y.o.   MRN: [id number]  HPI The patient is a 53 year old white female who is about 6 months status post umbilical hernia repair with small bowel resection. Her postoperative course has been complicated by a persistent open wound. She does have some soreness associated with the wound. She still has some clear drainage. She denies any fevers or chills. Her appetite is good and her bowels are working normally.  Review of Systems     Objective:   Physical Exam On exam her abdomen is soft and nontender. The open wound only tracks about 2-3 cm deep. The wound is very clean. She has a small rash probably from excess moisture around the wound    Assessment:     The patient is 6 months status post small bowel resection and umbilical hernia repair     Plan:     At this point I think it would be reasonable to stop packing the wound since it is so small and clean and see if the wound will close up on its own. Our plan to see her back in one month to check her progress. She may use Lamisil or nystatin cream for the rash around the open portion of the wound

## 2013-08-10 NOTE — Patient Instructions (Signed)
Stop packing wound and just cover with gauze

## 2013-09-07 ENCOUNTER — Encounter (INDEPENDENT_AMBULATORY_CARE_PROVIDER_SITE_OTHER): Payer: Self-pay | Admitting: General Surgery

## 2013-09-07 ENCOUNTER — Ambulatory Visit (INDEPENDENT_AMBULATORY_CARE_PROVIDER_SITE_OTHER): Payer: BC Managed Care – PPO | Admitting: General Surgery

## 2013-09-07 VITALS — BP 130/80 | HR 77 | Temp 97.4°F | Ht 60.0 in | Wt 277.0 lb

## 2013-09-07 DIAGNOSIS — K421 Umbilical hernia with gangrene: Secondary | ICD-10-CM

## 2013-09-07 NOTE — Progress Notes (Signed)
Subjective:     Patient ID: Lauren Schmidt, female   DOB: 10-21-1960, 53 y.o.   MRN: [id number]  HPI The patient is a 53 year old white female who is almost 7 months out from an umbilical hernia repair and small bowel resection. Her postoperative course was complicated by superficial wound infection. She has had a persistent superficial tract that has refused to close. Since her last visit she feels that it may have finally closed. She has not had any drainage from the area in the least a week.  Review of Systems     Objective:   Physical Exam On exam her abdomen is soft and nontender. The wound appears to be closed at this point. There is no sign of infection.    Assessment:     The patient is almost 7 months status post umbilical hernia repair and small bowel resection     Plan:     At this point she may return to all normal activities without restriction. All plan to see her back on a when necessary basis

## 2013-09-07 NOTE — Patient Instructions (Signed)
May return to normal activities 

## 2013-12-15 ENCOUNTER — Other Ambulatory Visit: Payer: Self-pay | Admitting: Obstetrics and Gynecology

## 2013-12-16 LAB — CYTOLOGY - PAP

## 2015-07-30 IMAGING — CT CT ABD-PELV W/ CM
2 of 5 series · 16 of 46 positions shown, 18 images · IV contrast (omnipaque)
Comparison: CT of the abdomen and pelvis 01/31/2013.

CLINICAL DATA: Status post bowel resection and hernia repair on
[REDACTED] with continued pain extending from the epigastric region down
into the left lower quadrant of the abdomen.

EXAM:
CT ABDOMEN AND PELVIS WITH CONTRAST
TECHNIQUE: Multidetector CT imaging of the abdomen and pelvis was performed
using the standard protocol following bolus administration of
intravenous contrast.
CONTRAST:  100mL OMNIPAQUE IOHEXOL 300 MG/ML  SOLN

[Series 2: abd/ pelvis 5.0 i30f 1 · axial · 0.84mm/px · z∈[-542,-122]mm · 13 of 96 slices shown, 15 images]
[im 6/96  soft-tissue]
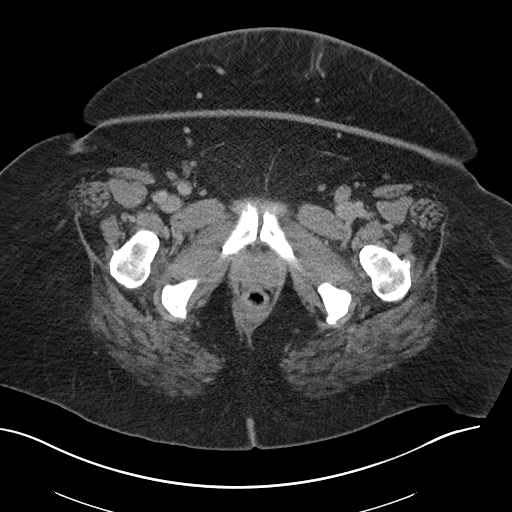
[im 6/96  bone]
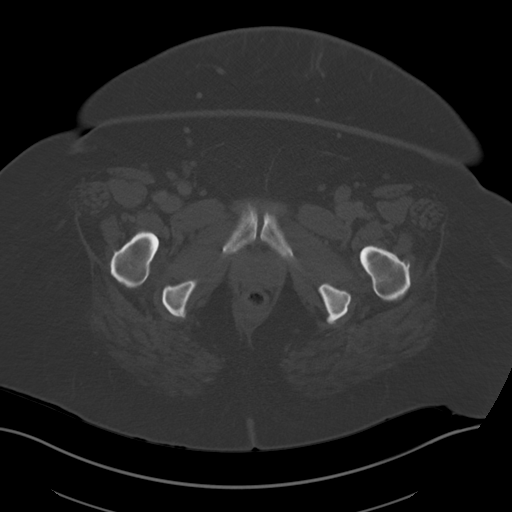
[im 12/96  soft-tissue]
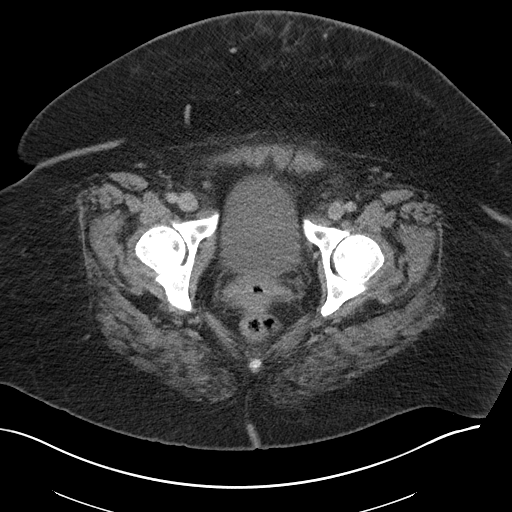
[im 23/96  soft-tissue]
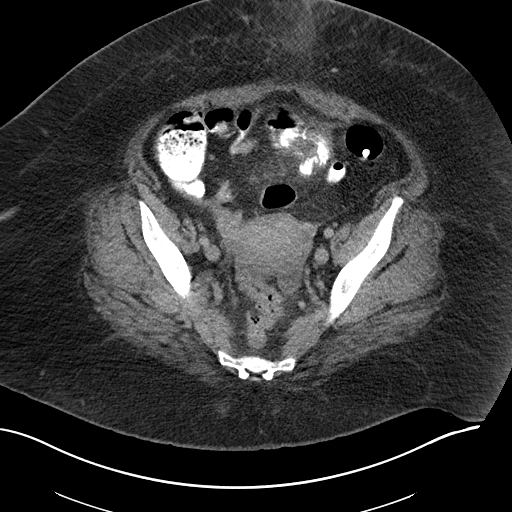
[im 28/96  soft-tissue]
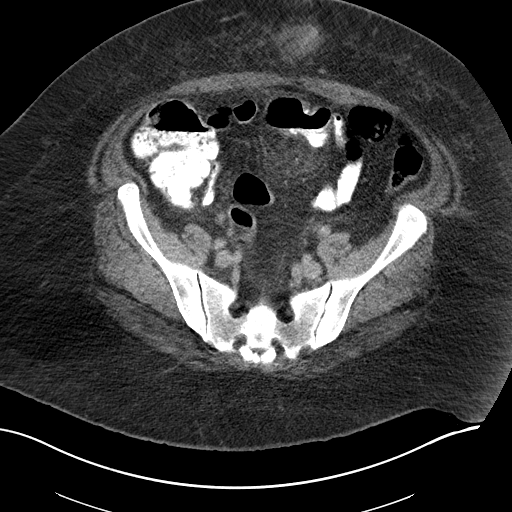
[im 34/96  soft-tissue]
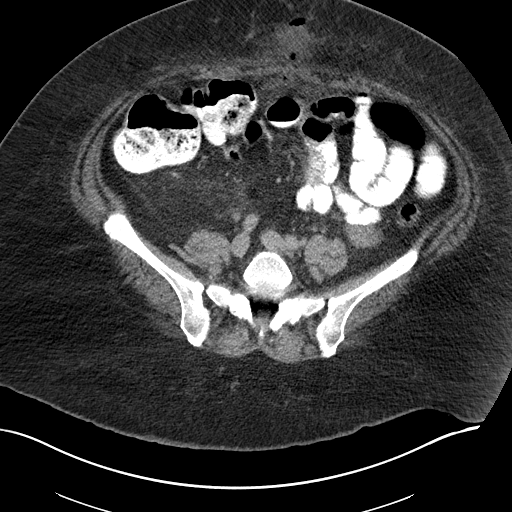
[im 40/96  soft-tissue]
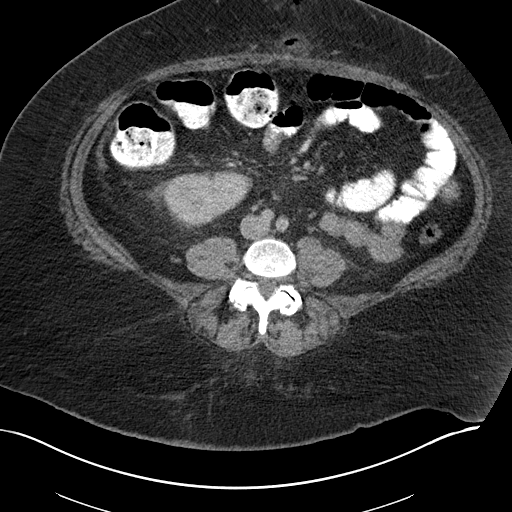
[im 51/96  soft-tissue]
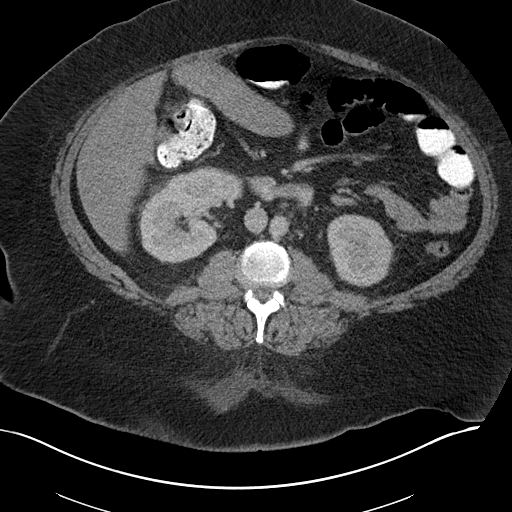
[im 56/96  soft-tissue]
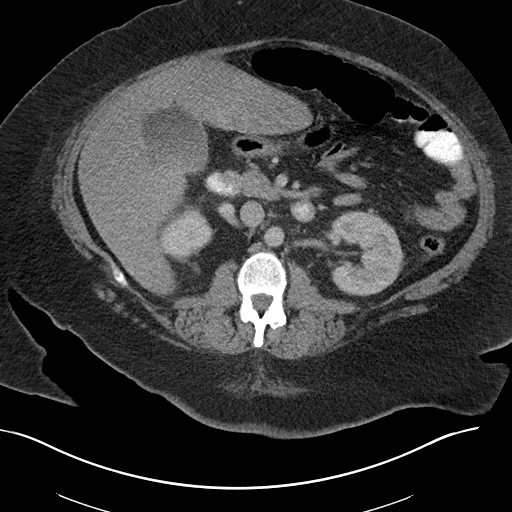
[im 62/96  soft-tissue]
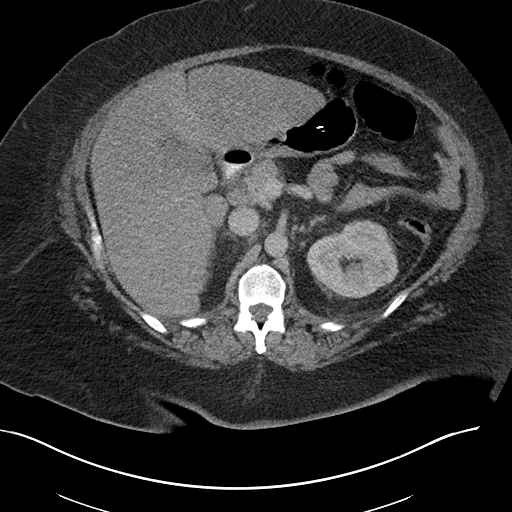
[im 62/96  bone]
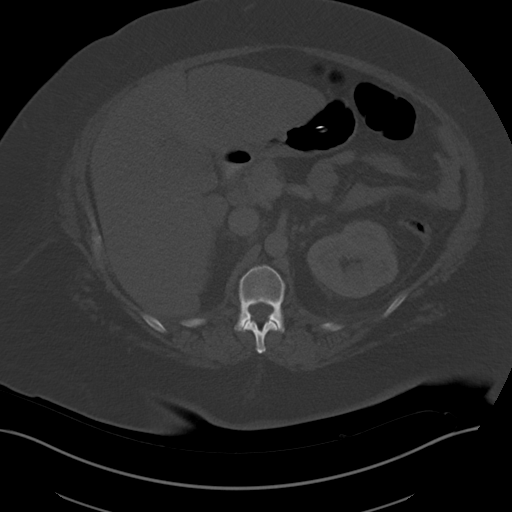
[im 68/96  soft-tissue]
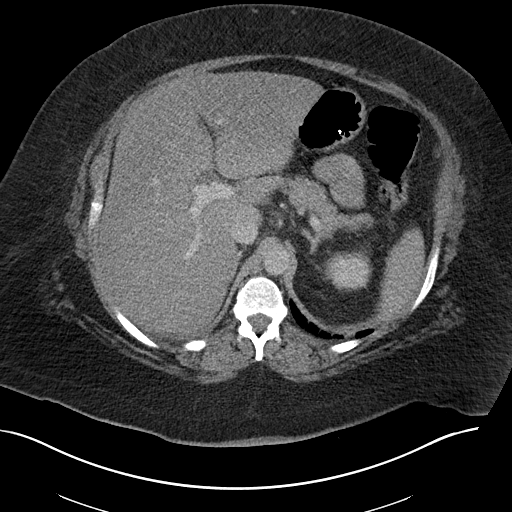
[im 73/96  soft-tissue]
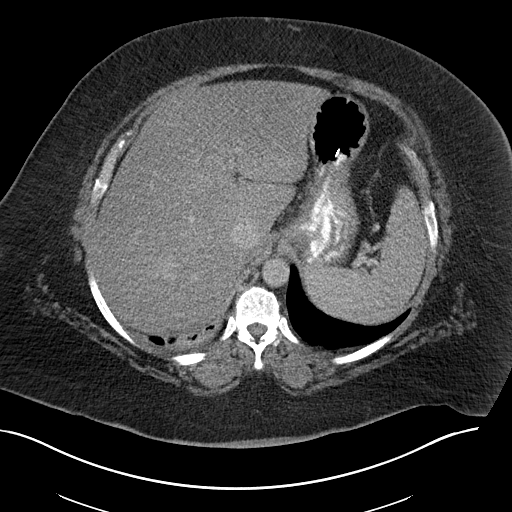
[im 84/96  soft-tissue]
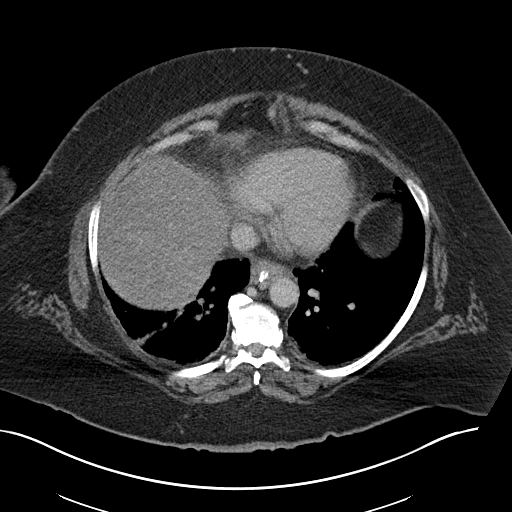
[im 90/96  soft-tissue]
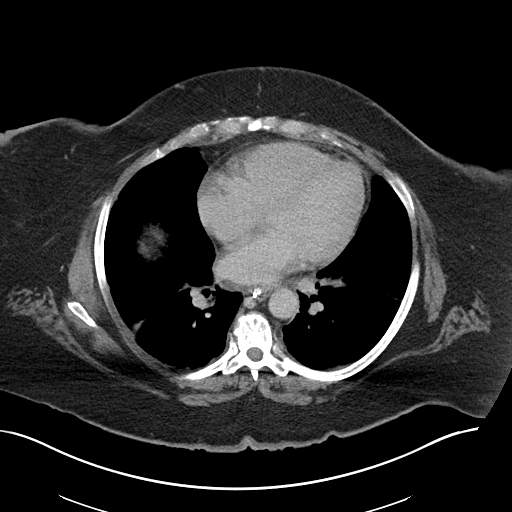

[Series 5: cororal soft tissue · coronal · 1.01mm/px · 3 of 110 slices shown]
[im 37/110  soft-tissue]
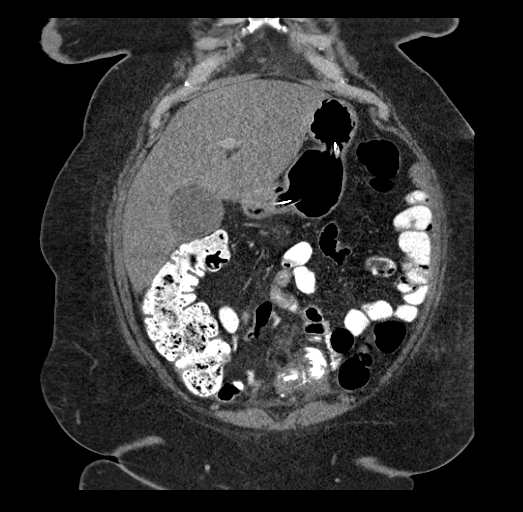
[im 49/110  soft-tissue]
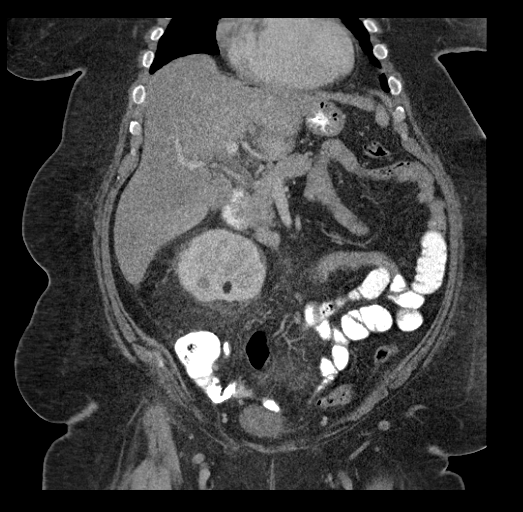
[im 61/110  soft-tissue]
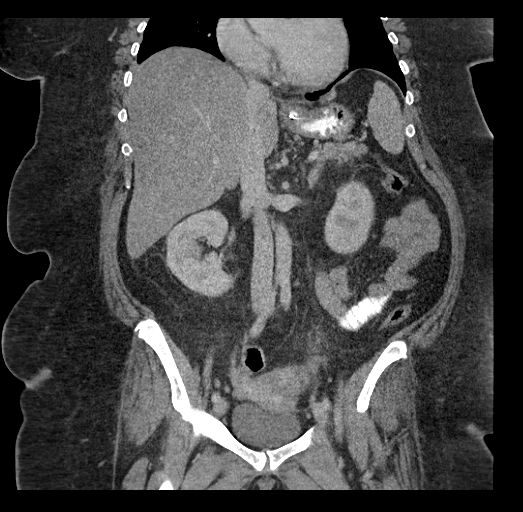

[16 of 46 positions shown; findings below may reference images not displayed]

FINDINGS: Lung Bases: Linear opacities throughout the lower lobes of the lungs
bilaterally (right greater than left) may represent areas of
subsegmental atelectasis and/or scarring.

Abdomen/Pelvis: Nasogastric tube extends into the antral pre-pyloric
region of the stomach. Diffuse low attenuation throughout the
hepatic parenchyma, compatible with hepatic steatosis. The
appearance of the gallbladder, pancreas, spleen, bilateral adrenal
glands and the left kidney is unremarkable. 16 mm fatty attenuation
lesion in the lower pole of the right kidney is similar to the prior
study, compatible with an angiomyolipoma.

Postoperative changes associated with the anterior abdominal wall in
the midline deep to the umbilicus, presumably related to recent
umbilical hernia repair. There is some stranding, small amounts of
fluid in small amounts of gas in these soft tissues, as well as
immediately deep to the anterior abdominal wall musculature,
presumably postoperative. No residual small bowel herniation is
identified at this time. There is a trace volume of ascites and a
small amount of mesenteric edema, but no significant volume of
ascites. No pneumoperitoneum. No pathologic distention of small
bowel. Postoperative changes of partial small bowel resection are
noted with anastomosis in the lower aspect of the central abdomen.
The anastomotic loops of small bowel demonstrate some mild wall
thickening, likely to represent some postoperative edema. Normal
appendix. The uterus and ovaries are unremarkable in appearance.
Urinary bladder is normal in appearance.

Musculoskeletal: There are no aggressive appearing lytic or blastic
lesions noted in the visualized portions of the skeleton.
IMPRESSION: 1. Postoperative changes related to recent partial small bowel
resection with primary anastomosis and umbilical hernia repair, as
above. There is a small amount of edema in the small bowel adjacent
to the anastomosis, and there is some surrounding mesenteric edema,
as well as resolving postoperative fluid and gas collections in the
anterior abdominal wall. No evidence of residual bowel obstruction,
recurrent hernia, or large abnormal postoperative fluid collections
at this time.
2. Hepatic steatosis.
3. 16 mm angiomyolipoma in the inferior pole of the right kidney.
4. Additional incidental findings, as above.

## 2016-05-14 ENCOUNTER — Emergency Department (HOSPITAL_COMMUNITY): Payer: Managed Care, Other (non HMO)

## 2016-05-14 ENCOUNTER — Inpatient Hospital Stay (HOSPITAL_COMMUNITY)
Admission: EM | Admit: 2016-05-14 | Discharge: 2016-05-17 | DRG: 390 | Disposition: A | Discharge status: Home / Self Care | Payer: Managed Care, Other (non HMO) | Attending: Internal Medicine | Admitting: Internal Medicine

## 2016-05-14 ENCOUNTER — Inpatient Hospital Stay (HOSPITAL_COMMUNITY): Payer: Managed Care, Other (non HMO)

## 2016-05-14 ENCOUNTER — Encounter (HOSPITAL_COMMUNITY): Payer: Self-pay | Admitting: Emergency Medicine

## 2016-05-14 DIAGNOSIS — E119 Type 2 diabetes mellitus without complications: Secondary | ICD-10-CM | POA: Diagnosis present

## 2016-05-14 DIAGNOSIS — F419 Anxiety disorder, unspecified: Secondary | ICD-10-CM | POA: Diagnosis present

## 2016-05-14 DIAGNOSIS — K56600 Partial intestinal obstruction, unspecified as to cause: Principal | ICD-10-CM | POA: Diagnosis present

## 2016-05-14 DIAGNOSIS — K56609 Unspecified intestinal obstruction, unspecified as to partial versus complete obstruction: Secondary | ICD-10-CM | POA: Diagnosis present

## 2016-05-14 DIAGNOSIS — K56699 Other intestinal obstruction unspecified as to partial versus complete obstruction: Secondary | ICD-10-CM

## 2016-05-14 DIAGNOSIS — I10 Essential (primary) hypertension: Secondary | ICD-10-CM | POA: Diagnosis not present

## 2016-05-14 DIAGNOSIS — E1165 Type 2 diabetes mellitus with hyperglycemia: Secondary | ICD-10-CM | POA: Diagnosis not present

## 2016-05-14 DIAGNOSIS — R109 Unspecified abdominal pain: Secondary | ICD-10-CM | POA: Diagnosis present

## 2016-05-14 DIAGNOSIS — Z7982 Long term (current) use of aspirin: Secondary | ICD-10-CM

## 2016-05-14 DIAGNOSIS — D72829 Elevated white blood cell count, unspecified: Secondary | ICD-10-CM | POA: Diagnosis present

## 2016-05-14 DIAGNOSIS — K5649 Other impaction of intestine: Secondary | ICD-10-CM | POA: Diagnosis not present

## 2016-05-14 DIAGNOSIS — E7800 Pure hypercholesterolemia, unspecified: Secondary | ICD-10-CM | POA: Diagnosis present

## 2016-05-14 DIAGNOSIS — Z794 Long term (current) use of insulin: Secondary | ICD-10-CM | POA: Diagnosis not present

## 2016-05-14 DIAGNOSIS — Z79899 Other long term (current) drug therapy: Secondary | ICD-10-CM | POA: Diagnosis not present

## 2016-05-14 DIAGNOSIS — K429 Umbilical hernia without obstruction or gangrene: Secondary | ICD-10-CM | POA: Diagnosis present

## 2016-05-14 DIAGNOSIS — Z9049 Acquired absence of other specified parts of digestive tract: Secondary | ICD-10-CM

## 2016-05-14 HISTORY — DX: Unspecified intestinal obstruction, unspecified as to partial versus complete obstruction: K56.609

## 2016-05-14 LAB — COMPREHENSIVE METABOLIC PANEL
ALBUMIN: 4.3 g/dL (ref 3.5–5.0)
ALT: 33 U/L (ref 14–54)
AST: 30 U/L (ref 15–41)
Alkaline Phosphatase: 41 U/L (ref 38–126)
Anion gap: 13 (ref 5–15)
BUN: 12 mg/dL (ref 6–20)
CO2: 25 mmol/L (ref 22–32)
Calcium: 10.3 mg/dL (ref 8.9–10.3)
Chloride: 101 mmol/L (ref 101–111)
Creatinine, Ser: 0.8 mg/dL (ref 0.44–1.00)
GFR calc Af Amer: >60 mL/min (ref >60)
GFR calc non Af Amer: >60 mL/min (ref >60)
GLUCOSE: 189 mg/dL — AB (ref 65–99)
POTASSIUM: 4.9 mmol/L (ref 3.5–5.1)
SODIUM: 139 mmol/L (ref 135–145)
Total Bilirubin: 0.5 mg/dL (ref 0.3–1.2)
Total Protein: 8.6 g/dL — ABNORMAL HIGH (ref 6.5–8.1)

## 2016-05-14 LAB — CBC
HCT: 40.3 % (ref 36.0–46.0)
HEMOGLOBIN: 12.7 g/dL (ref 12.0–15.0)
MCH: 26.8 pg (ref 26.0–34.0)
MCHC: 31.5 g/dL (ref 30.0–36.0)
MCV: 85 fL (ref 78.0–100.0)
Platelets: 407 10*3/uL — ABNORMAL HIGH (ref 150–400)
RBC: 4.74 MIL/uL (ref 3.87–5.11)
RDW: 14.1 % (ref 11.5–15.5)
WBC: 11 10*3/uL — ABNORMAL HIGH (ref 4.0–10.5)

## 2016-05-14 LAB — GLUCOSE, CAPILLARY: Glucose-Capillary: 275 mg/dL — ABNORMAL HIGH (ref 65–99)

## 2016-05-14 LAB — LIPASE, BLOOD: Lipase: 20 U/L (ref 11–51)

## 2016-05-14 MED ORDER — ONDANSETRON HCL 4 MG/2ML IJ SOLN
4.0000 mg | Freq: Once | INTRAMUSCULAR | Status: AC
  Administered 2016-05-14: 4 mg via INTRAVENOUS
  Filled 2016-05-14: qty 2

## 2016-05-14 MED ORDER — ACETAMINOPHEN 325 MG PO TABS: 650.0000 mg | ORAL_TABLET | Freq: Four times a day (QID) | ORAL | Status: DC | PRN

## 2016-05-14 MED ORDER — FAMOTIDINE IN NACL 20-0.9 MG/50ML-% IV SOLN
20.0000 mg | INTRAVENOUS | Status: DC
  Administered 2016-05-14 – 2016-05-16 (×3): 20 mg via INTRAVENOUS
  Filled 2016-05-14 (×3): qty 50

## 2016-05-14 MED ORDER — MORPHINE SULFATE (PF) 2 MG/ML IV SOLN
1.0000 mg | INTRAVENOUS | Status: DC | PRN
  Administered 2016-05-14: 1 mg via INTRAVENOUS
  Filled 2016-05-14: qty 1

## 2016-05-14 MED ORDER — ALPRAZOLAM 0.5 MG PO TABS: 0.5000 mg | ORAL_TABLET | Freq: Every evening | ORAL | Status: DC | PRN

## 2016-05-14 MED ORDER — SIMVASTATIN 40 MG PO TABS
40.0000 mg | ORAL_TABLET | Freq: Every evening | ORAL | Status: DC
  Administered 2016-05-16: 40 mg via ORAL
  Filled 2016-05-14: qty 1

## 2016-05-14 MED ORDER — HYDRALAZINE HCL 20 MG/ML IJ SOLN: 10.0000 mg | INTRAMUSCULAR | Status: DC | PRN

## 2016-05-14 MED ORDER — SERTRALINE HCL 100 MG PO TABS
100.0000 mg | ORAL_TABLET | Freq: Two times a day (BID) | ORAL | Status: DC
  Administered 2016-05-16 – 2016-05-17 (×2): 100 mg via ORAL
  Filled 2016-05-14 (×3): qty 1

## 2016-05-14 MED ORDER — ONDANSETRON HCL 4 MG PO TABS: 4.0000 mg | ORAL_TABLET | Freq: Four times a day (QID) | ORAL | Status: DC | PRN

## 2016-05-14 MED ORDER — DIATRIZOATE MEGLUMINE & SODIUM 66-10 % PO SOLN
90.0000 mL | Freq: Once | ORAL | Status: AC
  Administered 2016-05-14: 90 mL via NASOGASTRIC
  Filled 2016-05-14 (×2): qty 90

## 2016-05-14 MED ORDER — INSULIN ASPART 100 UNIT/ML ~~LOC~~ SOLN
0.0000 [IU] | SUBCUTANEOUS | Status: DC
  Administered 2016-05-14: 5 [IU] via SUBCUTANEOUS
  Administered 2016-05-15: 3 [IU] via SUBCUTANEOUS
  Administered 2016-05-15 (×2): 2 [IU] via SUBCUTANEOUS
  Administered 2016-05-15: 1 [IU] via SUBCUTANEOUS
  Administered 2016-05-15: 3 [IU] via SUBCUTANEOUS
  Administered 2016-05-16: 1 [IU] via SUBCUTANEOUS
  Administered 2016-05-16: 2 [IU] via SUBCUTANEOUS
  Administered 2016-05-16 (×3): 1 [IU] via SUBCUTANEOUS
  Administered 2016-05-16 – 2016-05-17 (×2): 2 [IU] via SUBCUTANEOUS
  Administered 2016-05-17: 3 [IU] via SUBCUTANEOUS
  Administered 2016-05-17: 1 [IU] via SUBCUTANEOUS
  Administered 2016-05-17: 2 [IU] via SUBCUTANEOUS

## 2016-05-14 MED ORDER — ENOXAPARIN SODIUM 40 MG/0.4ML ~~LOC~~ SOLN
40.0000 mg | SUBCUTANEOUS | Status: DC
Start: 2016-05-15 — End: 2016-05-17
  Administered 2016-05-15 – 2016-05-17 (×3): 40 mg via SUBCUTANEOUS
  Filled 2016-05-14 (×3): qty 0.4

## 2016-05-14 MED ORDER — IOPAMIDOL (ISOVUE-300) INJECTION 61%
INTRAVENOUS | Status: AC
  Administered 2016-05-14: 100 mL
  Filled 2016-05-14: qty 100

## 2016-05-14 MED ORDER — ACETAMINOPHEN 650 MG RE SUPP
650.0000 mg | Freq: Four times a day (QID) | RECTAL | Status: DC | PRN
  Filled 2016-05-14: qty 1

## 2016-05-14 MED ORDER — HYDROMORPHONE HCL 2 MG/ML IJ SOLN
1.0000 mg | Freq: Once | INTRAMUSCULAR | Status: AC
  Administered 2016-05-14: 1 mg via INTRAVENOUS
  Filled 2016-05-14: qty 1

## 2016-05-14 MED ORDER — METOPROLOL SUCCINATE ER 25 MG PO TB24
50.0000 mg | ORAL_TABLET | Freq: Every day | ORAL | Status: DC
  Administered 2016-05-16 – 2016-05-17 (×2): 50 mg via ORAL
  Filled 2016-05-14 (×2): qty 2

## 2016-05-14 MED ORDER — ATENOLOL 25 MG PO TABS: 50.0000 mg | ORAL_TABLET | Freq: Every day | ORAL | Status: DC

## 2016-05-14 MED ORDER — ADULT MULTIVITAMIN LIQUID CH
5.0000 mL | Freq: Every day | ORAL | Status: DC
  Filled 2016-05-14: qty 15

## 2016-05-14 MED ORDER — ONDANSETRON HCL 4 MG/2ML IJ SOLN
4.0000 mg | Freq: Four times a day (QID) | INTRAMUSCULAR | Status: DC | PRN
  Administered 2016-05-15: 4 mg via INTRAVENOUS
  Filled 2016-05-14: qty 2

## 2016-05-14 MED ORDER — LACTATED RINGERS IV SOLN
INTRAVENOUS | Status: DC
  Administered 2016-05-14 – 2016-05-16 (×3): via INTRAVENOUS
  Administered 2016-05-16: 1000 mL via INTRAVENOUS

## 2016-05-14 MED ORDER — ASPIRIN EC 81 MG PO TBEC
81.0000 mg | DELAYED_RELEASE_TABLET | Freq: Every evening | ORAL | Status: DC
  Administered 2016-05-16: 81 mg via ORAL
  Filled 2016-05-14: qty 1

## 2016-05-14 NOTE — ED Provider Notes (Signed)
[facility]-EMERGENCY DEPT Provider Note   CSN: [id number] Arrival date & time: 05/14/16  1258     History   Chief Complaint Chief Complaint  Patient presents with  . Abdominal Pain    HPI Lauren Schmidt is a 56 y.o. female.  Patient presents emergency department with chief complaint of abdominal pain. She reports a history of small bowel obstruction. She has had colon resection in the past. She states the pain started last night. She denies any fevers, or chills. She reports that she has felt nauseated, but denies any vomiting, diarrhea, and states she has had decreased bowel movement this morning. She denies any other associated symptoms. She states that this pain feels the same as her prior polyp structure in. She rates her pain as a 10 out of 10. It does not radiate.   The history is provided by the patient. No language interpreter was used.    Past Medical History:  Diagnosis Date  . Diabetes mellitus without complication (HCC)   . Hypercholesterolemia   . Hypertension   . Umbilical hernia with gangrene and obstruction 01/31/2013   Repaired on 01/31/13. SB resection done     Patient Active Problem List   Diagnosis Date Noted  . Postoperative wound infection 02/06/2013  . Umbilical hernia with gangrene and obstruction 01/31/2013    Past Surgical History:  Procedure Laterality Date  . BOWEL RESECTION N/A 01/31/2013   Procedure: SMALL BOWEL RESECTION;  Surgeon: Robyne Askew, MD;  Location: [facility] OR;  Service: General;  Laterality: N/A;  . KNEE SURGERY    . UMBILICAL HERNIA REPAIR N/A 01/31/2013   Procedure: HERNIA REPAIR UMBILICAL ADULT;  Surgeon: Robyne Askew, MD;  Location: [facility] OR;  Service: General;  Laterality: N/A;    OB History    No data available       Home Medications    Prior to Admission medications   Medication Sig Start Date End Date Taking? Authorizing Provider  ALPRAZolam Prudy Feeler) 0.5 MG tablet Take 0.5 mg by mouth at bedtime as needed for sleep or  anxiety.    Historical Provider, MD  atenolol (TENORMIN) 50 MG tablet Take 50 mg by mouth daily.    Historical Provider, MD  fenofibrate (TRICOR) 145 MG tablet Take 145 mg by mouth daily.    Historical Provider, MD  fluconazole (DIFLUCAN) 150 MG tablet  12/30/12   Historical Provider, MD  glimepiride (AMARYL) 4 MG tablet Take 4 mg by mouth daily before breakfast.    Historical Provider, MD  lisinopril (PRINIVIL,ZESTRIL) 20 MG tablet Take 20 mg by mouth daily.    Historical Provider, MD  LO LOESTRIN FE 1 MG-10 MCG / 10 MCG tablet  02/18/13   Historical Provider, MD  metFORMIN (GLUCOPHAGE-XR) 500 MG 24 hr tablet Take 1,000 mg by mouth 2 (two) times daily.    Historical Provider, MD  omega-3 acid ethyl esters (LOVAZA) 1 G capsule Take 2 g by mouth 2 (two) times daily.    Historical Provider, MD  sertraline (ZOLOFT) 100 MG tablet  11/22/12   Historical Provider, MD  simvastatin (ZOCOR) 40 MG tablet Take 40 mg by mouth every evening.    Historical Provider, MD  valACYclovir (VALTREX) 1000 MG tablet  02/02/13   Historical Provider, MD    Family History History reviewed. No pertinent family history.  Social History Social History  Substance Use Topics  . Smoking status: Never Smoker  . Smokeless tobacco: Never Used  . Alcohol use No  Allergies   Patient has no known allergies.   Review of Systems Review of Systems  Gastrointestinal: Positive for abdominal pain and nausea. Negative for diarrhea and vomiting.  All other systems reviewed and are negative.    Physical Exam Updated Vital Signs BP (!) 178/101 (BP Location: Right Arm)   Pulse 118   Temp 99.3 F (37.4 C) (Oral)   Resp 18   SpO2 97%   Physical Exam  Constitutional: She is oriented to person, place, and time. She appears well-developed and well-nourished.  HENT:  Head: Normocephalic and atraumatic.  Eyes: Conjunctivae and EOM are normal. Pupils are equal, round, and reactive to light.  Neck: Normal range of motion.  Neck supple.  Cardiovascular: Normal rate and regular rhythm.  Exam reveals no gallop and no friction rub.   No murmur heard. Pulmonary/Chest: Effort normal and breath sounds normal. No respiratory distress. She has no wheezes. She has no rales. She exhibits no tenderness.  Abdominal: Soft. Bowel sounds are normal. She exhibits no distension and no mass. There is tenderness. There is no rebound and no guarding.  Musculoskeletal: Normal range of motion. She exhibits no edema or tenderness.  Neurological: She is alert and oriented to person, place, and time.  Skin: Skin is warm and dry.  Psychiatric: She has a normal mood and affect. Her behavior is normal. Judgment and thought content normal.  Nursing note and vitals reviewed.    ED Treatments / Results  Labs (all labs ordered are listed, but only abnormal results are displayed) Labs Reviewed  COMPREHENSIVE METABOLIC PANEL - Abnormal; Notable for the following:       Result Value   Glucose, Bld 189 (*)    Total Protein 8.6 (*)    All other components within normal limits  CBC - Abnormal; Notable for the following:    WBC 11.0 (*)    Platelets 407 (*)    All other components within normal limits  LIPASE, BLOOD  URINALYSIS, ROUTINE W REFLEX MICROSCOPIC    EKG  EKG Interpretation None       Radiology Ct Abdomen Pelvis W Contrast  Result Date: 05/14/2016 CLINICAL DATA:  Abdominal bloating. Denies diarrhea or constipation. History small bowel obstruction. EXAM: CT ABDOMEN AND PELVIS WITH CONTRAST TECHNIQUE: Multidetector CT imaging of the abdomen and pelvis was performed using the standard protocol following bolus administration of intravenous contrast. CONTRAST:  ISOVUE-300 IOPAMIDOL (ISOVUE-300) INJECTION 61% COMPARISON:  03/05/2013 CT FINDINGS: Lower chest: Minimal atelectasis and/or scarring medially at the right lung base. Stable since 2014 is a 2 mm left lower lobe nodular density consistent with a benign finding. Normal  visualized cardiac chambers. No pericardial effusion. Hepatobiliary: Hepatic steatosis. No biliary dilatation. Physiologically distended gallbladder without calculi, pericholecystic fluid nor wall thickening. Pancreas: Normal Spleen: Normal Adrenals/Urinary Tract: Normal bilateral adrenal glands. 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 An 11mm adjacent hypodensity is seen with suggestion of internal septations. This is not significantly changed in size since 2014 however it is not a simple cyst and better characterization without and with IV contrast either with CT or MRI is suggested. Stomach/Bowel: Small hiatal hernia. Fluid-filled moderate distention of the stomach. Small fatty focus in the gastric antrum may represent an ingested item/pill containing fat or small lipoma, measuring up to 1 cm. Normal small bowel rotation. Small bowel dilatation consistent with small-bowel obstruction with fecalized material leading up to the site of prior small bowel anastomosis  where there is an abrupt transition point noted. Findings may represent stenosis at the anastomotic site accounting for the bowel dilatation. Small bowel immediately proximal to the anastomosis measures 6.7 cm in caliber. Additionally there is a ventral umbilical fat and small bowel containing hernia without incarceration. The opening of the hernia approximately 6.8 cm transverse. The anti mesenteric wall of the dilated small bowel containing fecalized material is the loop of bowel involved with this hernia. Vascular/Lymphatic: No significant vascular findings are present. No enlarged abdominal or pelvic lymph nodes. Reproductive: Uterus and bilateral adnexa are unremarkable apart from a few calcifications within the myometrium of the uterus consistent with tiny calcified fibroids or vascular calcifications. Other: No ascites or free air. Musculoskeletal: T7 through T9 anterior flowing  osteophytes. Facet arthropathy L3 through S1. No listhesis. No acute osseous abnormality. IMPRESSION: 1. High-grade early or partial SBO with transition point noted at the small bowel anastomotic site possibly related to stenosis at the anastomosis. Fecalized material noted within small bowel immediately proximal to the sutures measuring up to 6.7 cm in caliber. A portion of this affected bowel is noted partially within a fat containing umbilical hernia without evidence of incarceration. 2. 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 Adjacent to this lesion however is an indeterminate 1 cm hypodense lesion with slightly thickened appearing septa, Bosniak IIF lesion. Would recommend CT or MRI without and with IV contrast for better characterization and follow-up as deemed necessary based on those findings. Though stable in size, the septations appear slightly more conspicuous on current exam. 3. Tiny uterine calcifications possibly representing fibroids or vascular calcifications. 4. Hepatic steatosis. Electronically Signed   By: Tollie Ethavid  Kwon M.D.   On: 05/14/2016 17:21    Procedures Procedures (including critical care time)  Medications Ordered in ED Medications  HYDROmorphone (DILAUDID) injection 1 mg (not administered)  ondansetron (ZOFRAN) injection 4 mg (not administered)     Initial Impression / Assessment and Plan / ED Course  I have reviewed the triage vital signs and the nursing notes.  Pertinent labs & imaging results that were available during my care of the patient were reviewed by me and considered in my medical decision making (see chart for details).     Patient with history of small bowel obstruction, complains of severe abdominal pain. Onset last night. We'll treat pain, check labs, CT. Will reassess  CT scan is consistent with small bowel obstruction. I also discussed the incidental finding of the angiomyolipoma patient's right kidney with patient. Recommend that she  follow-up with her primary care provider for this.  I discussed the case with Dr. Derrell Lollingamirez from general surgery, appreciate for consulting on the patient. Dr. Derrell Lollingamirez recommends medicine admission and NG tube.  Appreciate Dr. Ella JubileeArrien for admitting the patient.  Final Clinical Impressions(s) / ED Diagnoses   Final diagnoses:  SBO (small bowel obstruction)    New Prescriptions New Prescriptions   No medications on file     Roxy HorsemanRobert Radford Pease, PA-C 05/14/16 1759    Benjiman CoreNathan Pickering, MD 05/16/16 [phone]

## 2016-05-14 NOTE — ED Triage Notes (Signed)
Pt here for lower abd pain x 2 days that feels similar to when had bowel blockage in past

## 2016-05-14 NOTE — ED Notes (Signed)
Attempted report x1. 

## 2016-05-14 NOTE — ED Notes (Signed)
Radiology aware of portable xray

## 2016-05-14 NOTE — ED Notes (Signed)
Pt made aware of bed assignment 

## 2016-05-14 NOTE — Consult Note (Signed)
Reason for Consult: Small bowel obstruction Referring Physician: [name] is an 56 y.o. female.  HPI: Patient is a 56 year old female with a past medical history significant for diabetes, hyper cholesterolemia, hypertension. Patient also has a history of a strangulated umbilical hernia in 5053 that was fixed by Dr. Marlou Schmidt. This required bowel resection with anastomosis as well as primary repair of the umbilical hernia.  Patient states that she had a 1 day history of abdominal pain that started right paramedian area and has transitioned to the left paramedian area. She states she had no nausea or vomiting. Patient states her last bowel movement was this morning. She states her last flatus was this morning.  Patient presented to ER for further evaluation.  On evaluation ER she underwent CT scan as well as laboratory studies. CT scan revealed a small bowel obstruction with possible transition point at the previous anastomosis.  Patient did have an elevated WBC count 11.0  Past Medical History:  Diagnosis Date  . Diabetes mellitus without complication ([place])   . Hypercholesterolemia   . Hypertension   . Umbilical hernia with gangrene and obstruction 01/31/2013   Repaired on 01/31/13. SB resection done     Past Surgical History:  Procedure Laterality Date  . BOWEL RESECTION N/A 01/31/2013   Procedure: SMALL BOWEL RESECTION;  Surgeon: Lauren Roof, MD;  Location: [facility];  Service: General;  Laterality: N/A;  . KNEE SURGERY    . UMBILICAL HERNIA REPAIR N/A 01/31/2013   Procedure: HERNIA REPAIR UMBILICAL ADULT;  Surgeon: Lauren Roof, MD;  Location: [facility];  Service: General;  Laterality: N/A;    History reviewed. No pertinent family history.  Social History:  reports that she has never smoked. She has never used smokeless tobacco. She reports that she does not drink alcohol or use drugs.  Allergies:  Allergies  Allergen Reactions  . Adhesive [Tape]     Leave red marks    . Shellfish Allergy     Rash     Medications: I have reviewed the patient's current medications.  Results for orders placed or performed during the hospital encounter of 05/14/16 (from the past 48 hour(s))  Lipase, blood     Status: None   Collection Time: 05/14/16  1:30 PM  Result Value Ref Range   Lipase 20 11 - 51 U/L  Comprehensive metabolic panel     Status: Abnormal   Collection Time: 05/14/16  1:30 PM  Result Value Ref Range   Sodium 139 135 - 145 mmol/L   Potassium 4.9 3.5 - 5.1 mmol/L   Chloride 101 101 - 111 mmol/L   CO2 25 22 - 32 mmol/L   Glucose, Bld 189 (H) 65 - 99 mg/dL   BUN 12 6 - 20 mg/dL   Creatinine, Ser 0.80 0.44 - 1.00 mg/dL   Calcium 10.3 8.9 - 10.3 mg/dL   Total Protein 8.6 (H) 6.5 - 8.1 g/dL   Albumin 4.3 3.5 - 5.0 g/dL   AST 30 15 - 41 U/L   ALT 33 14 - 54 U/L   Alkaline Phosphatase 41 38 - 126 U/L   Total Bilirubin 0.5 0.3 - 1.2 mg/dL   GFR calc non Af Amer >60 >60 mL/min   GFR calc Af Amer >60 >60 mL/min    Comment: (NOTE) The eGFR has been calculated using the CKD EPI equation. This calculation has not been validated in all clinical situations. eGFR's persistently <60 mL/min signify possible Chronic  Kidney Disease.    Anion gap 13 5 - 15  CBC     Status: Abnormal   Collection Time: 05/14/16  1:30 PM  Result Value Ref Range   WBC 11.0 (H) 4.0 - 10.5 K/uL   RBC 4.74 3.87 - 5.11 MIL/uL   Hemoglobin 12.7 12.0 - 15.0 g/dL   HCT 40.3 36.0 - 46.0 %   MCV 85.0 78.0 - 100.0 fL   MCH 26.8 26.0 - 34.0 pg   MCHC 31.5 30.0 - 36.0 g/dL   RDW 14.1 11.5 - 15.5 %   Platelets 407 (H) 150 - 400 K/uL    Ct Abdomen Pelvis W Contrast  Result Date: 05/14/2016 CLINICAL DATA:  Abdominal bloating. Denies diarrhea or constipation. History small bowel obstruction. EXAM: CT ABDOMEN AND PELVIS WITH CONTRAST TECHNIQUE: Multidetector CT imaging of the abdomen and pelvis was performed using the standard protocol following bolus administration of intravenous contrast.  CONTRAST:  172m ISOVUE-300 IOPAMIDOL (ISOVUE-300) INJECTION 61% COMPARISON:  03/05/2013 CT FINDINGS: Lower chest: Minimal atelectasis and/or scarring medially at the right lung base. Stable since 2014 is a 2 mm left lower lobe nodular density consistent with a benign finding. Normal visualized cardiac chambers. No pericardial effusion. Hepatobiliary: Hepatic steatosis. No biliary dilatation. Physiologically distended gallbladder without calculi, pericholecystic fluid nor wall thickening. Pancreas: Normal Spleen: Normal Adrenals/Urinary Tract: Normal bilateral adrenal glands. 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 An 122madjacent hypodensity is seen with suggestion of internal septations. This is not significantly changed in size since 2014 however it is not a simple cyst and better characterization without and with IV contrast either with CT or MRI is suggested. Stomach/Bowel: Small hiatal hernia. Fluid-filled moderate distention of the stomach. Small fatty focus in the gastric antrum may represent an ingested item/pill containing fat or small lipoma, measuring up to 1 cm. Normal small bowel rotation. Small bowel dilatation consistent with small-bowel obstruction with fecalized material leading up to the site of prior small bowel anastomosis where there is an abrupt transition point noted. Findings may represent stenosis at the anastomotic site accounting for the bowel dilatation. Small bowel immediately proximal to the anastomosis measures 6.7 cm in caliber. Additionally there is a ventral umbilical fat and small bowel containing hernia without incarceration. The opening of the hernia approximately 6.8 cm transverse. The anti mesenteric wall of the dilated small bowel containing fecalized material is the loop of bowel involved with this hernia. Vascular/Lymphatic: No significant vascular findings are present. No enlarged abdominal or pelvic lymph  nodes. Reproductive: Uterus and bilateral adnexa are unremarkable apart from a few calcifications within the myometrium of the uterus consistent with tiny calcified fibroids or vascular calcifications. Other: No ascites or free air. Musculoskeletal: T7 through T9 anterior flowing osteophytes. Facet arthropathy L3 through S1. No listhesis. No acute osseous abnormality. IMPRESSION: 1. High-grade early or partial SBO with transition point noted at the small bowel anastomotic site possibly related to stenosis at the anastomosis. Fecalized material noted within small bowel immediately proximal to the sutures measuring up to 6.7 cm in caliber. A portion of this affected bowel is noted partially within a fat containing umbilical hernia without evidence of incarceration. 2. 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 Adjacent to this lesion however is an indeterminate 1 cm hypodense lesion with slightly thickened appearing septa, Bosniak IIF lesion. Would recommend CT or MRI without and with IV contrast for better characterization and follow-up as deemed necessary  based on those findings. Though stable in size, the septations appear slightly more conspicuous on current exam. 3. Tiny uterine calcifications possibly representing fibroids or vascular calcifications. 4. Hepatic steatosis. Electronically Signed   By: Ashley Royalty M.D.   On: 05/14/2016 17:21    Review of Systems  Constitutional: Negative for chills, fever and weight loss.  HENT: Negative for ear discharge, ear pain, hearing loss and tinnitus.   Eyes: Negative for blurred vision, double vision, photophobia and pain.  Respiratory: Negative for cough, hemoptysis and sputum production.   Cardiovascular: Negative for chest pain, palpitations, orthopnea and claudication.  Gastrointestinal: Positive for abdominal pain. Negative for constipation, diarrhea, heartburn, nausea and vomiting.  Genitourinary: Negative for dysuria, frequency and urgency.   Musculoskeletal: Negative for back pain, myalgias and neck pain.  Skin: Negative for itching and rash.  Neurological: Negative for dizziness, tingling, tremors and headaches.  All other systems reviewed and are negative.  Blood pressure 114/55, pulse 100, temperature 99.3 F (37.4 C), temperature source Oral, resp. rate 22, SpO2 99 %. Physical Exam  Constitutional: She is oriented to person, place, and time. She appears well-developed and well-nourished. No distress.  HENT:  Head: Normocephalic and atraumatic.  Right Ear: External ear normal.  Left Ear: External ear normal.  Eyes: EOM are normal. Pupils are equal, round, and reactive to light. Right eye exhibits no discharge. Left eye exhibits no discharge. No scleral icterus.  Neck: Normal range of motion. Neck supple. No JVD present. No tracheal deviation present. No thyromegaly present.  Cardiovascular: Normal rate, regular rhythm and intact distal pulses.  Exam reveals no gallop.   No murmur heard. Respiratory: Effort normal and breath sounds normal. No stridor. No respiratory distress. She has no wheezes. She has no rales. She exhibits no tenderness.  GI: Soft. Bowel sounds are normal. She exhibits no distension and no mass. There is tenderness (midline and right paramedian area). There is no rebound and no guarding.  Musculoskeletal: Normal range of motion. She exhibits no edema, tenderness or deformity.  Neurological: She is alert and oriented to person, place, and time.  Skin: Skin is warm and dry. No rash noted. She is not diaphoretic. No erythema. No pallor.    Assessment/Plan: 69 -year-old female with small bowel obstruction and recurrent ventral hernia. HTN DM Obesity  1. At this time recommend NG tube, small bowel obstruction protocol 2. Nothing by mouth, IV fluids 3. We'll follow along.  Lauren Schmidt., Lauren Schmidt 05/14/2016, 7:26 PM

## 2016-05-14 NOTE — H&P (Signed)
History and Physical    Conley Pawling ZOX:[id number] DOB: January 26, 1961 DOA: 05/14/2016  PCP: Lupe Carney, MD    Patient coming from: Home   Chief Complaint: abdominal pain   HPI: Lauren Schmidt is a 56 y.o. female with medical history significant of hypertension and diabetes who presents to the hospital with a chief complaint abdominal pain. The pain started last night, it was colicky in nature, generalized, no radication, 10 out of 10 in intensity, no improving or worsening factors. Persistent pain through the night and early this morning, associated with nausea and vomiting. Due to persistent symptoms she presented to emergency for evaluation.  Patient had similar bowel obstruction 2014 which required surgical intervention  ED Course: Patient diagnosed with bowel obstruction, started on IV fluids, analgesia and entihemetics, NG tube to LIS, general surgery consulted with recommendation to admit to the medical service.   Review of Systems:  1. General. No fevers or chills. 2. ENT no runny nose or sore throat 3. Pulmonary no shortness of breath, cough or hemoptysis 4. Cardiovascular, no angina, no claudication, no PND orthopnea 5. Gastrointestinal, positive for abdominal pain, nausea and vomiting as mentioned in history present illness 6. Musculoskeletal no joint pain 7. Dermatology no rashes 8. Hematology no easy bruisability or frequent infections 9. Urology no dysuria or increased urinary frequency 10. Neurology no seizures or paresthesias  Past Medical History:  Diagnosis Date  . Diabetes mellitus without complication ([facility])   . Hypercholesterolemia   . Hypertension   . Umbilical hernia with gangrene and obstruction 01/31/2013   Repaired on 01/31/13. SB resection done     Past Surgical History:  Procedure Laterality Date  . BOWEL RESECTION N/A 01/31/2013   Procedure: SMALL BOWEL RESECTION;  Surgeon: Robyne Askew, MD;  Location: [facility];  Service: General;  Laterality: N/A;    . KNEE SURGERY    . UMBILICAL HERNIA REPAIR N/A 01/31/2013   Procedure: HERNIA REPAIR UMBILICAL ADULT;  Surgeon: Robyne Askew, MD;  Location: [facility] OR;  Service: General;  Laterality: N/A;     reports that she has never smoked. She has never used smokeless tobacco. She reports that she does not drink alcohol or use drugs.  Allergies  Allergen Reactions  . Adhesive [Tape]     Leave red marks   . Shellfish Allergy     Rash     History reviewed. No pertinent family history. Unacceptable: Noncontributory, unremarkable, or negative. Acceptable: Family history reviewed and not pertinent (If you reviewed it)  Prior to Admission medications   Medication Sig Start Date End Date Taking? Authorizing Provider  ALPRAZolam Prudy Feeler) 0.5 MG tablet Take 0.5 mg by mouth at bedtime as needed for sleep or anxiety.   Yes Historical Provider, MD  aspirin EC 81 MG tablet Take 81 mg by mouth every evening.   Yes Historical Provider, MD  atenolol (TENORMIN) 50 MG tablet Take 50 mg by mouth daily.   Yes Historical Provider, MD  dapagliflozin propanediol (FARXIGA) 5 MG TABS tablet Take 5 mg by mouth daily.   Yes Historical Provider, MD  metoprolol succinate (TOPROL-XL) 50 MG 24 hr tablet Take 50 mg by mouth daily. Take with or immediately following a meal.   Yes Historical Provider, MD  Multiple Vitamin (MULTIVITAMIN) LIQD Take 5 mLs by mouth daily.   Yes Historical Provider, MD  omeprazole (PRILOSEC) 20 MG capsule Take 40 mg by mouth daily.   Yes Historical Provider, MD  sertraline (ZOLOFT) 100 MG tablet Take 100  mg by mouth 2 (two) times daily.  11/22/12  Yes Historical Provider, MD  simvastatin (ZOCOR) 40 MG tablet Take 40 mg by mouth every evening.   Yes Historical Provider, MD    Physical Exam: Vitals:   05/14/16 1312 05/14/16 1600 05/14/16 1731 05/14/16 1732  BP: (!) 178/101 133/70 115/55   Pulse: 118 115  104  Resp: 18     Temp: 99.3 F (37.4 C)     TempSrc: Oral     SpO2: 97% 97%  96%       Constitutional: deconditioned and in pain.  Vitals:   05/14/16 1312 05/14/16 1600 05/14/16 1731 05/14/16 1732  BP: (!) 178/101 133/70 115/55   Pulse: 118 115  104  Resp: 18     Temp: 99.3 F (37.4 C)     TempSrc: Oral     SpO2: 97% 97%  96%   Eyes: PERRL, lids and conjunctivae normal, mild pallor, no icterus.  ENMT: Mucous membranes are dry. Posterior pharynx clear of any exudate or lesions.Normal dentition.  Neck: normal, supple, no masses, no thyromegaly Respiratory: clear to auscultation bilaterally, no wheezing, no crackles. Normal respiratory effort. No accessory muscle use. Mild decreased breath sounds at bases.  Cardiovascular: Regular rate and rhythm, no murmurs / rubs / gallops. No extremity edema. 2+ pedal pulses. No carotid bruits.  Abdomen: protuberant with tenderness to superficial palpation, no rebound or guarding. No masses palpated. No hepatosplenomegaly. Bowel sounds positive. Positive abdominal wall hernia.  Musculoskeletal: no clubbing / cyanosis. No joint deformity upper and lower extremities. Good ROM, no contractures. Normal muscle tone.  Skin: no rashes, lesions, ulcers. No induration Neurologic: CN 2-12 grossly intact. Sensation intact, DTR normal. Strength 5/5 in all 4.    Labs on Admission: I have personally reviewed following labs and imaging studies  CBC:  Recent Labs Lab 05/14/16 1330  WBC 11.0*  HGB 12.7  HCT 40.3  MCV 85.0  PLT 407*   Basic Metabolic Panel:  Recent Labs Lab 05/14/16 1330  NA 139  K 4.9  CL 101  CO2 25  GLUCOSE 189*  BUN 12  CREATININE 0.80  CALCIUM 10.3   GFR: CrCl cannot be calculated (Unknown ideal weight.). Liver Function Tests:  Recent Labs Lab 05/14/16 1330  AST 30  ALT 33  ALKPHOS 41  BILITOT 0.5  PROT 8.6*  ALBUMIN 4.3    Recent Labs Lab 05/14/16 1330  LIPASE 20   No results for input(s): AMMONIA in the last 168 hours. Coagulation Profile: No results for input(s): INR, PROTIME in  the last 168 hours. Cardiac Enzymes: No results for input(s): CKTOTAL, CKMB, CKMBINDEX, TROPONINI in the last 168 hours. BNP (last 3 results) No results for input(s): PROBNP in the last 8760 hours. HbA1C: No results for input(s): HGBA1C in the last 72 hours. CBG: No results for input(s): GLUCAP in the last 168 hours. Lipid Profile: No results for input(s): CHOL, HDL, LDLCALC, TRIG, CHOLHDL, LDLDIRECT in the last 72 hours. Thyroid Function Tests: No results for input(s): TSH, T4TOTAL, FREET4, T3FREE, THYROIDAB in the last 72 hours. Anemia Panel: No results for input(s): VITAMINB12, FOLATE, FERRITIN, TIBC, IRON, RETICCTPCT in the last 72 hours. Urine analysis:    Component Value Date/Time   COLORURINE AMBER (A) 01/31/2013 2140   APPEARANCEUR CLEAR 01/31/2013 2140   LABSPEC 1.044 (H) 01/31/2013 2140   PHURINE 5.0 01/31/2013 2140   GLUCOSEU 100 (A) 01/31/2013 2140   HGBUR NEGATIVE 01/31/2013 2140   BILIRUBINUR SMALL (A) 01/31/2013 2140  KETONESUR 15 (A) 01/31/2013 2140   PROTEINUR NEGATIVE 01/31/2013 2140   UROBILINOGEN 0.2 01/31/2013 2140   NITRITE NEGATIVE 01/31/2013 2140   LEUKOCYTESUR NEGATIVE 01/31/2013 2140   Sepsis Labs: !!!!!!!!!!!!!!!!!!!!!!!!!!!!!!!!!!!!!!!!!!!! @LABRCNTIP (procalcitonin:4,lacticidven:4) )No results found for this or any previous visit (from the past 240 hour(s)).   Radiological Exams on Admission: Ct Abdomen Pelvis W Contrast  Result Date: 05/14/2016 CLINICAL DATA:  Abdominal bloating. Denies diarrhea or constipation. History small bowel obstruction. EXAM: CT ABDOMEN AND PELVIS WITH CONTRAST TECHNIQUE: Multidetector CT imaging of the abdomen and pelvis was performed using the standard protocol following bolus administration of intravenous contrast. CONTRAST:  ISOVUE-300 IOPAMIDOL (ISOVUE-300) INJECTION 61% COMPARISON:  03/05/2013 CT FINDINGS: Lower chest: Minimal atelectasis and/or scarring medially at the right lung base. Stable since 2014 is a 2 mm  left lower lobe nodular density consistent with a benign finding. Normal visualized cardiac chambers. No pericardial effusion. Hepatobiliary: Hepatic steatosis. No biliary dilatation. Physiologically distended gallbladder without calculi, pericholecystic fluid nor wall thickening. Pancreas: Normal Spleen: Normal Adrenals/Urinary Tract: Normal bilateral adrenal glands. 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 An 11mm adjacent hypodensity is seen with suggestion of internal septations. This is not significantly changed in size since 2014 however it is not a simple cyst and better characterization without and with IV contrast either with CT or MRI is suggested. Stomach/Bowel: Small hiatal hernia. Fluid-filled moderate distention of the stomach. Small fatty focus in the gastric antrum may represent an ingested item/pill containing fat or small lipoma, measuring up to 1 cm. Normal small bowel rotation. Small bowel dilatation consistent with small-bowel obstruction with fecalized material leading up to the site of prior small bowel anastomosis where there is an abrupt transition point noted. Findings may represent stenosis at the anastomotic site accounting for the bowel dilatation. Small bowel immediately proximal to the anastomosis measures 6.7 cm in caliber. Additionally there is a ventral umbilical fat and small bowel containing hernia without incarceration. The opening of the hernia approximately 6.8 cm transverse. The anti mesenteric wall of the dilated small bowel containing fecalized material is the loop of bowel involved with this hernia. Vascular/Lymphatic: No significant vascular findings are present. No enlarged abdominal or pelvic lymph nodes. Reproductive: Uterus and bilateral adnexa are unremarkable apart from a few calcifications within the myometrium of the uterus consistent with tiny calcified fibroids or vascular calcifications. Other: No  ascites or free air. Musculoskeletal: T7 through T9 anterior flowing osteophytes. Facet arthropathy L3 through S1. No listhesis. No acute osseous abnormality. IMPRESSION: 1. High-grade early or partial SBO with transition point noted at the small bowel anastomotic site possibly related to stenosis at the anastomosis. Fecalized material noted within small bowel immediately proximal to the sutures measuring up to 6.7 cm in caliber. A portion of this affected bowel is noted partially within a fat containing umbilical hernia without evidence of incarceration. 2. 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 Adjacent to this lesion however is an indeterminate 1 cm hypodense lesion with slightly thickened appearing septa, Bosniak IIF lesion. Would recommend CT or MRI without and with IV contrast for better characterization and follow-up as deemed necessary based on those findings. Though stable in size, the septations appear slightly more conspicuous on current exam. 3. Tiny uterine calcifications possibly representing fibroids or vascular calcifications. 4. Hepatic steatosis. Electronically Signed   By: Tollie Eth M.D.   On: 05/14/2016 17:21    EKG: Independently reviewed. NA  Assessment/Plan Active  Problems:   SBO (small bowel obstruction)   Bowel obstruction   This is a 56 year old female who has diabetes and hypertension, prior history of small bowel structure required surgery, presents with severe abdominal pain, acute in onset, no improving or worsening factors. On the physical examination blood pressure 133/70, heart rate 115, oxygen saturation 97%. Her abdomen is protuberant, tender to deep palpation, no guarding or rebound, positive abdominal wall hernia. Sodium 139, potassium 4.9, chloride 101, bicarbonate 25, glucose 189, BUN 12, creatinine 0.80, AST 30, ALT 33, lipase 20, white count 11.0, hemoglobin 12.7, hematocrit 40.3, platelets 407. CT abdomen with high grade early or partial small bowel  obstruction with transition point noted at the small bowel anastomotic site possibly related to stenosis at the anastomosis. Noted umbilical hernia without incarceration.   Working diagnosis: Small bowel obstruction.  1. Small bowel obstruction. Hydrate or partial obstruction. Will continue conservative care, IV fluids, IV analgesia, NG tube to low intermittent suction. Antiacid therapy intravenously. Following surgery recommendations. Nothing by mouth.  2. Hypertension. Continue blood pressure monitoring, hold by mouth medications while patient NPO, will give hydralazine as needed intravenously 10 mg every 4 hours for systolic blood pressure in 180. Patient on by mouth metoprolol and lisinopril at home.  3. Type 2 diabetes mellitus. Will hold on oral hypoglycemic agents, will place patient on insulin signed scale with glucose monitoring every 4 hours. Depending on insulin requirements will calculate basal insulin. Admission glucose 189. Target 160-180.  4. Anxiety. Continue alprazolam as needed.   DVT prophylaxis: enoxaparin  Code Status: Full  Family Communication: I spoke with patient's family at the bedside and all questions were addressed. Disposition Plan: home  Consults called: Surgery  Admission status: Inpatient   Camar Guyton Annett Gulaaniel Zia Kanner MD [facility] Pager [phone]- [phone]  If 7PM-7AM, please contact night-coverage [email or web address] Password [email or web address]  05/14/2016, 6:12 PM

## 2016-05-14 NOTE — ED Notes (Signed)
Patient transported to CT 

## 2016-05-15 ENCOUNTER — Inpatient Hospital Stay (HOSPITAL_COMMUNITY): Payer: Managed Care, Other (non HMO)

## 2016-05-15 DIAGNOSIS — K56600 Partial intestinal obstruction, unspecified as to cause: Principal | ICD-10-CM

## 2016-05-15 LAB — GLUCOSE, CAPILLARY
GLUCOSE-CAPILLARY: 207 mg/dL — AB (ref 65–99)
GLUCOSE-CAPILLARY: 183 mg/dL — AB (ref 65–99)
GLUCOSE-CAPILLARY: 150 mg/dL — AB (ref 65–99)
GLUCOSE-CAPILLARY: 142 mg/dL — AB (ref 65–99)
Glucose-Capillary: 216 mg/dL — ABNORMAL HIGH (ref 65–99)

## 2016-05-15 LAB — COMPREHENSIVE METABOLIC PANEL
ALK PHOS: 42 U/L (ref 38–126)
ALT: 29 U/L (ref 14–54)
ANION GAP: 13 (ref 5–15)
AST: 24 U/L (ref 15–41)
Albumin: 3.8 g/dL (ref 3.5–5.0)
BILIRUBIN TOTAL: 0.6 mg/dL (ref 0.3–1.2)
BUN: 13 mg/dL (ref 6–20)
CALCIUM: 9.3 mg/dL (ref 8.9–10.3)
CO2: 27 mmol/L (ref 22–32)
Chloride: 98 mmol/L — ABNORMAL LOW (ref 101–111)
Creatinine, Ser: 0.81 mg/dL (ref 0.44–1.00)
GFR calc non Af Amer: >60 mL/min (ref >60)
Glucose, Bld: 221 mg/dL — ABNORMAL HIGH (ref 65–99)
Potassium: 4.3 mmol/L (ref 3.5–5.1)
SODIUM: 138 mmol/L (ref 135–145)
TOTAL PROTEIN: 8 g/dL (ref 6.5–8.1)

## 2016-05-15 LAB — CBC
HCT: 37.2 % (ref 36.0–46.0)
HEMOGLOBIN: 11.7 g/dL — AB (ref 12.0–15.0)
MCH: 27.2 pg (ref 26.0–34.0)
MCHC: 31.5 g/dL (ref 30.0–36.0)
MCV: 86.5 fL (ref 78.0–100.0)
Platelets: 349 10*3/uL (ref 150–400)
RBC: 4.3 MIL/uL (ref 3.87–5.11)
RDW: 14.7 % (ref 11.5–15.5)
WBC: 10.2 10*3/uL (ref 4.0–10.5)

## 2016-05-15 LAB — BASIC METABOLIC PANEL
ANION GAP: 12 (ref 5–15)
BUN: 15 mg/dL (ref 6–20)
CALCIUM: 9.3 mg/dL (ref 8.9–10.3)
CO2: 27 mmol/L (ref 22–32)
CREATININE: 0.75 mg/dL (ref 0.44–1.00)
Chloride: 101 mmol/L (ref 101–111)
GFR calc non Af Amer: >60 mL/min (ref >60)
Glucose, Bld: 217 mg/dL — ABNORMAL HIGH (ref 65–99)
Potassium: 4.1 mmol/L (ref 3.5–5.1)
SODIUM: 140 mmol/L (ref 135–145)

## 2016-05-15 MED ORDER — CHLORHEXIDINE GLUCONATE 0.12 % MT SOLN
15.0000 mL | Freq: Two times a day (BID) | OROMUCOSAL | Status: DC
  Administered 2016-05-15 – 2016-05-17 (×5): 15 mL via OROMUCOSAL
  Filled 2016-05-15 (×6): qty 15

## 2016-05-15 MED ORDER — MORPHINE SULFATE (PF) 2 MG/ML IV SOLN: 2.0000 mg | INTRAVENOUS | Status: DC | PRN

## 2016-05-15 MED ORDER — ORAL CARE MOUTH RINSE
15.0000 mL | Freq: Two times a day (BID) | OROMUCOSAL | Status: DC
  Administered 2016-05-15 – 2016-05-17 (×5): 15 mL via OROMUCOSAL

## 2016-05-15 MED ORDER — MORPHINE SULFATE (PF) 2 MG/ML IV SOLN
2.0000 mg | INTRAVENOUS | Status: AC | PRN
  Administered 2016-05-15: 2 mg via INTRAVENOUS
  Filled 2016-05-15: qty 1

## 2016-05-15 MED ORDER — ADULT MULTIVITAMIN LIQUID CH
15.0000 mL | Freq: Every day | ORAL | Status: DC
  Administered 2016-05-16 – 2016-05-17 (×2): 15 mL via ORAL
  Filled 2016-05-15 (×3): qty 15

## 2016-05-15 MED ORDER — MORPHINE SULFATE (PF) 4 MG/ML IV SOLN
4.0000 mg | Freq: Once | INTRAVENOUS | Status: AC
  Administered 2016-05-15: 4 mg via INTRAVENOUS
  Filled 2016-05-15: qty 1

## 2016-05-15 NOTE — Progress Notes (Signed)
Patient felt nauseous and had about 10cc of vomitting two hours after contrast administered. Was reassured and antiemetic given Will continue to monitor.

## 2016-05-15 NOTE — Progress Notes (Signed)
Gastrografin contrast given at the said time and clamped. Will be connected to low intermittent suctioning in a hours time. Patient tolerated it well.

## 2016-05-15 NOTE — Progress Notes (Signed)
Inpatient Diabetes Program Recommendations  AACE/ADA: New Consensus Statement on Inpatient Glycemic Control (2015)  Target Ranges:  Prepandial:   less than 140 mg/dL      Peak postprandial:   less than 180 mg/dL (1-2 hours)      Critically ill patients:  140 - 180 mg/dL   Lab Results  Component Value Date   GLUCAP 183 (H) 05/15/2016   HGBA1C 6.2 (H) 02/01/2013   Review of Glycemic Control  Diabetes history: DM 2 Outpatient Diabetes medications: Farxiga 5 mg Daily, Amaryl 4 mg Daily, Metformin 1000 mg BID Current orders for Inpatient glycemic control: Novolog Sensitive Q4 hours  Inpatient Diabetes Program Recommendations:   Glucose above inpatient goal. Consider increasing correction scale to Novolog Moderate Q4 hours.  Thanks,  Christena DeemShannon Olive Motyka RN, MSN, [facility] Inpatient Diabetes Coordinator Team Pager [phone] (8a-5p)

## 2016-05-15 NOTE — Progress Notes (Signed)
Subjective: Pt in no distress concerned staff doesn't know how to work NGT  Objective: Vital signs in last 24 hours: Temp:  [98.4 F (36.9 C)-99.3 F (37.4 C)] 98.4 F (36.9 C) (02/07 0500) Pulse Rate:  [82-118] 82 (02/07 0500) Resp:  [18-22] 18 (02/07 0500) BP: (112-178)/(45-101) 118/52 (02/07 0500) SpO2:  [90 %-99 %] 93 % (02/07 0500)    Intake/Output from previous day: 02/06 0701 - 02/07 0700 In: 565 [I.V.:515; IV Piggyback:50] Out: 451 [Urine:300; Emesis/NG output:151] Intake/Output this shift: No intake/output data recorded.  GI: ABDOMENN DISTENDED OBESE NGT WORKING SORE   Lab Results:   Recent Labs  05/14/16 1330 05/15/16 0359  WBC 11.0* 10.2  HGB 12.7 11.7*  HCT 40.3 37.2  PLT 407* 349   BMET  Recent Labs  05/14/16 1330 05/15/16 0359  NA 139 138  K 4.9 4.3  CL 101 98*  CO2 25 27  GLUCOSE 189* 221*  BUN 12 13  CREATININE 0.80 0.81  CALCIUM 10.3 9.3   PT/INR No results for input(s): LABPROT, INR in the last 72 hours. ABG No results for input(s): PHART, HCO3 in the last 72 hours.  Invalid input(s): PCO2, PO2  Studies/Results: Ct Abdomen Pelvis W Contrast  Result Date: 05/14/2016 CLINICAL DATA:  Abdominal bloating. Denies diarrhea or constipation. History small bowel obstruction. EXAM: CT ABDOMEN AND PELVIS WITH CONTRAST TECHNIQUE: Multidetector CT imaging of the abdomen and pelvis was performed using the standard protocol following bolus administration of intravenous contrast. CONTRAST:  100mL ISOVUE-300 IOPAMIDOL (ISOVUE-300) INJECTION 61% COMPARISON:  03/05/2013 CT FINDINGS: Lower chest: Minimal atelectasis and/or scarring medially at the right lung base. Stable since 2014 is a 2 mm left lower lobe nodular density consistent with a benign finding. Normal visualized cardiac chambers. No pericardial effusion. Hepatobiliary: Hepatic steatosis. No biliary dilatation. Physiologically distended gallbladder without calculi, pericholecystic fluid nor wall  thickening. Pancreas: Normal Spleen: Normal Adrenals/Urinary Tract: Normal bilateral adrenal glands. 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 An 11mm adjacent hypodensity is seen with suggestion of internal septations. This is not significantly changed in size since 2014 however it is not a simple cyst and better characterization without and with IV contrast either with CT or MRI is suggested. Stomach/Bowel: Small hiatal hernia. Fluid-filled moderate distention of the stomach. Small fatty focus in the gastric antrum may represent an ingested item/pill containing fat or small lipoma, measuring up to 1 cm. Normal small bowel rotation. Small bowel dilatation consistent with small-bowel obstruction with fecalized material leading up to the site of prior small bowel anastomosis where there is an abrupt transition point noted. Findings may represent stenosis at the anastomotic site accounting for the bowel dilatation. Small bowel immediately proximal to the anastomosis measures 6.7 cm in caliber. Additionally there is a ventral umbilical fat and small bowel containing hernia without incarceration. The opening of the hernia approximately 6.8 cm transverse. The anti mesenteric wall of the dilated small bowel containing fecalized material is the loop of bowel involved with this hernia. Vascular/Lymphatic: No significant vascular findings are present. No enlarged abdominal or pelvic lymph nodes. Reproductive: Uterus and bilateral adnexa are unremarkable apart from a few calcifications within the myometrium of the uterus consistent with tiny calcified fibroids or vascular calcifications. Other: No ascites or free air. Musculoskeletal: T7 through T9 anterior flowing osteophytes. Facet arthropathy L3 through S1. No listhesis. No acute osseous abnormality. IMPRESSION: 1. High-grade early or partial SBO with transition point noted at the  small bowel anastomotic site  possibly related to stenosis at the anastomosis. Fecalized material noted within small bowel immediately proximal to the sutures measuring up to 6.7 cm in caliber. A portion of this affected bowel is noted partially within a fat containing umbilical hernia without evidence of incarceration. 2. 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 Adjacent to this lesion however is an indeterminate 1 cm hypodense lesion with slightly thickened appearing septa, Bosniak IIF lesion. Would recommend CT or MRI without and with IV contrast for better characterization and follow-up as deemed necessary based on those findings. Though stable in size, the septations appear slightly more conspicuous on current exam. 3. Tiny uterine calcifications possibly representing fibroids or vascular calcifications. 4. Hepatic steatosis. Electronically Signed   By: Tollie Eth M.D.   On: 05/14/2016 17:21   Dg Abd Portable 1v-small Bowel Obstruction Protocol-initial, 8 Hr Delay  Result Date: 05/15/2016 CLINICAL DATA:  Small bowel obstruction EXAM: PORTABLE ABDOMEN - 1 VIEW COMPARISON:  CT abdomen and pelvis May 14, 2016; abdominal radiograph May 14, 2016 FINDINGS: Nasogastric tube tip and side port in stomach. There remain loops of mildly dilated proximal small bowel. No air-fluid levels. No free air. There is contrast in the urinary bladder. IMPRESSION: Nasogastric tube tip and side port in stomach. Mild dilatation of small bowel loops in the left abdomen. Suspect a degree of residual bowel obstruction. No progression of bowel dilatation is evident. No free air. Electronically Signed   By: Bretta Bang III M.D.   On: 05/15/2016 07:52   Dg Abd Portable 1 View  Result Date: 05/14/2016 CLINICAL DATA:  NG tube verification. EXAM: PORTABLE ABDOMEN - 1 VIEW COMPARISON:  CT of the abdomen and pelvis earlier today. FINDINGS: Nasogastric tube tip lies along the greater curvature of the stomach in satisfactory position. It could be  advanced several cm if desired. Small bowel gaseous distention redemonstrated primarily on the LEFT. IMPRESSION: Nasogastric tube tip lies within the stomach. Electronically Signed   By: Elsie Stain M.D.   On: 05/14/2016 21:33    Anti-infectives: Anti-infectives    None      Assessment/Plan: Patient Active Problem List   Diagnosis Date Noted  . SBO (small bowel obstruction) 05/14/2016  . Bowel obstruction 05/14/2016  . Postoperative wound infection 02/06/2013  . 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 05/15/2016

## 2016-05-15 NOTE — Progress Notes (Signed)
[facility] PROGRESS NOTE  [name] ZOX:[id number] DOB: 1961-01-30 DOA: 05/14/2016   PCP: Lupe Carney, MD     Assessment/Plan: Active Problems:   SBO (small bowel obstruction)   Bowel obstruction  Lauren Schmidt is a 56 y.o. female with medical history significant of hypertension and diabetes who presents to the hospital with a chief complaint abdominal pain.  Due to persistent symptoms she presented to emergency for evaluation.Patient had similar bowel obstruction 2014 which required surgical intervention.Patient diagnosed with bowel obstruction,CT abdomen with high grade early or partial small bowel obstruction with transition point noted at the small bowel anastomotic site possibly related to stenosis at the anastomosis. Noted umbilical hernia without incarceration started on IV fluids, analgesia and entihemetics, NG tube to LIS, general surgery consulted with recommendation to admit to the medical service   Assessment and plan  1. Small bowel obstruction. recurrent  Incisional hernia   And partial SBO, surgery following. Will continue conservative care, IV fluids, IV analgesia, NG tube to low intermittent suction. Repeat  abdominal films.   Following surgery recommendations. Nothing by mouth. No acute need for surgery at this time  2. Hypertension. Continue blood pressure monitoring, hold by mouth medications while patient NPO, will give hydralazine as needed intravenously 10 mg every 4 hours for systolic blood pressure in 180. Patient on by mouth metoprolol and lisinopril at home. Blood pressure soft.  3. Type 2 diabetes mellitus. Will hold on oral hypoglycemic agents, continue insulin signed scale with glucose monitoring every 4 hours. Depending on insulin requirements will calculate basal insulin. Admission glucose 189. Target 160-180. Check hemoglobin A1c  4. Anxiety. Continue alprazolam as needed.    DVT prophylaxsis Lovenox  Code Status:  Full code     Family Communication: Discussed in detail with the patient, all imaging results, lab results explained to the patient   Disposition Plan: 2 to 3 days      Consultants:  General surgery  Procedures:  None  Antibiotics: Anti-infectives    None         HPI/Subjective: Passing flatus, does not feel well, and denies any nausea  Objective: Vitals:   05/14/16 2000 05/14/16 2100 05/15/16 0300 05/15/16 0500  BP: (!) 119/53 117/78 112/62 (!) 118/52  Pulse: 105 98 92 82  Resp:  20 18 18   Temp:  98.4 F (36.9 C) 98.4 F (36.9 C) 98.4 F (36.9 C)  TempSrc:  Oral Oral Oral  SpO2: 92% 94% 93% 93%    Intake/Output Summary (Last 24 hours) at 05/15/16 0853 Last data filed at 05/15/16 8119  Gross per 24 hour  Intake              565 ml  Output              451 ml  Net              114 ml    Exam:  Examination:  General exam: Appears calm and comfortable  Respiratory system: Clear to auscultation. Respiratory effort normal. Cardiovascular system: S1 & S2 heard, RRR. No JVD, murmurs, rubs, gallops or clicks. No pedal edema. Gastrointestinal system: Abdomen is nondistended, soft and nontender. No organomegaly or masses felt. Normal bowel sounds heard. Central nervous system: Alert and oriented. No focal neurological deficits. Extremities: Symmetric 5 x 5 power. Skin: No rashes, lesions or ulcers Psychiatry: Judgement and insight appear normal. Mood & affect appropriate.     Data Reviewed: I have personally reviewed following  labs and imaging studies  Micro Results No results found for this or any previous visit (from the past 240 hour(s)).  Radiology Reports Ct Abdomen Pelvis W Contrast  Result Date: 05/14/2016 CLINICAL DATA:  Abdominal bloating. Denies diarrhea or constipation. History small bowel obstruction. EXAM: CT ABDOMEN AND PELVIS WITH CONTRAST TECHNIQUE: Multidetector CT imaging of the abdomen and pelvis was performed using the standard protocol following  bolus administration of intravenous contrast. CONTRAST:  100mL ISOVUE-300 IOPAMIDOL (ISOVUE-300) INJECTION 61% COMPARISON:  03/05/2013 CT FINDINGS: Lower chest: Minimal atelectasis and/or scarring medially at the right lung base. Stable since 2014 is a 2 mm left lower lobe nodular density consistent with a benign finding. Normal visualized cardiac chambers. No pericardial effusion. Hepatobiliary: Hepatic steatosis. No biliary dilatation. Physiologically distended gallbladder without calculi, pericholecystic fluid nor wall thickening. Pancreas: Normal Spleen: Normal Adrenals/Urinary Tract: Normal bilateral adrenal glands. 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 An 11mm adjacent hypodensity is seen with suggestion of internal septations. This is not significantly changed in size since 2014 however it is not a simple cyst and better characterization without and with IV contrast either with CT or MRI is suggested. Stomach/Bowel: Small hiatal hernia. Fluid-filled moderate distention of the stomach. Small fatty focus in the gastric antrum may represent an ingested item/pill containing fat or small lipoma, measuring up to 1 cm. Normal small bowel rotation. Small bowel dilatation consistent with small-bowel obstruction with fecalized material leading up to the site of prior small bowel anastomosis where there is an abrupt transition point noted. Findings may represent stenosis at the anastomotic site accounting for the bowel dilatation. Small bowel immediately proximal to the anastomosis measures 6.7 cm in caliber. Additionally there is a ventral umbilical fat and small bowel containing hernia without incarceration. The opening of the hernia approximately 6.8 cm transverse. The anti mesenteric wall of the dilated small bowel containing fecalized material is the loop of bowel involved with this hernia. Vascular/Lymphatic: No significant vascular findings are  present. No enlarged abdominal or pelvic lymph nodes. Reproductive: Uterus and bilateral adnexa are unremarkable apart from a few calcifications within the myometrium of the uterus consistent with tiny calcified fibroids or vascular calcifications. Other: No ascites or free air. Musculoskeletal: T7 through T9 anterior flowing osteophytes. Facet arthropathy L3 through S1. No listhesis. No acute osseous abnormality. IMPRESSION: 1. High-grade early or partial SBO with transition point noted at the small bowel anastomotic site possibly related to stenosis at the anastomosis. Fecalized material noted within small bowel immediately proximal to the sutures measuring up to 6.7 cm in caliber. A portion of this affected bowel is noted partially within a fat containing umbilical hernia without evidence of incarceration. 2. 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 Adjacent to this lesion however is an indeterminate 1 cm hypodense lesion with slightly thickened appearing septa, Bosniak IIF lesion. Would recommend CT or MRI without and with IV contrast for better characterization and follow-up as deemed necessary based on those findings. Though stable in size, the septations appear slightly more conspicuous on current exam. 3. Tiny uterine calcifications possibly representing fibroids or vascular calcifications. 4. Hepatic steatosis. Electronically Signed   By: Tollie Ethavid  Kwon M.D.   On: 05/14/2016 17:21   Dg Abd Portable 1v-small Bowel Obstruction Protocol-initial, 8 Hr Delay  Result Date: 05/15/2016 CLINICAL DATA:  Small bowel obstruction EXAM: PORTABLE ABDOMEN - 1 VIEW COMPARISON:  CT abdomen and pelvis May 14, 2016; abdominal radiograph May 14, 2016 FINDINGS: Nasogastric tube tip and side port in stomach. There remain loops of mildly dilated proximal small bowel. No air-fluid levels. No free air. There is contrast in the urinary bladder. IMPRESSION: Nasogastric tube tip and side port in stomach. Mild  dilatation of small bowel loops in the left abdomen. Suspect a degree of residual bowel obstruction. No progression of bowel dilatation is evident. No free air. Electronically Signed   By: Bretta Bang III M.D.   On: 05/15/2016 07:52   Dg Abd Portable 1 View  Result Date: 05/14/2016 CLINICAL DATA:  NG tube verification. EXAM: PORTABLE ABDOMEN - 1 VIEW COMPARISON:  CT of the abdomen and pelvis earlier today. FINDINGS: Nasogastric tube tip lies along the greater curvature of the stomach in satisfactory position. It could be advanced several cm if desired. Small bowel gaseous distention redemonstrated primarily on the LEFT. IMPRESSION: Nasogastric tube tip lies within the stomach. Electronically Signed   By: Elsie Stain M.D.   On: 05/14/2016 21:33     CBC  Recent Labs Lab 05/14/16 1330 05/15/16 0359  WBC 11.0* 10.2  HGB 12.7 11.7*  HCT 40.3 37.2  PLT 407* 349  MCV 85.0 86.5  MCH 26.8 27.2  MCHC 31.5 31.5  RDW 14.1 14.7    Chemistries   Recent Labs Lab 05/14/16 1330 05/15/16 0359  NA 139 138  K 4.9 4.3  CL 101 98*  CO2 25 27  GLUCOSE 189* 221*  BUN 12 13  CREATININE 0.80 0.81  CALCIUM 10.3 9.3  AST 30 24  ALT 33 29  ALKPHOS 41 42  BILITOT 0.5 0.6   ------------------------------------------------------------------------------------------------------------------ CrCl cannot be calculated (Unknown ideal weight.). ------------------------------------------------------------------------------------------------------------------ No results for input(s): HGBA1C in the last 72 hours. ------------------------------------------------------------------------------------------------------------------ No results for input(s): CHOL, HDL, LDLCALC, TRIG, CHOLHDL, LDLDIRECT in the last 72 hours. ------------------------------------------------------------------------------------------------------------------ No results for input(s): TSH, T4TOTAL, T3FREE, THYROIDAB in the last 72  hours.  Invalid input(s): FREET3 ------------------------------------------------------------------------------------------------------------------ No results for input(s): VITAMINB12, FOLATE, FERRITIN, TIBC, IRON, RETICCTPCT in the last 72 hours.  Coagulation profile No results for input(s): INR, PROTIME in the last 168 hours.  No results for input(s): DDIMER in the last 72 hours.  Cardiac Enzymes No results for input(s): CKMB, TROPONINI, MYOGLOBIN in the last 168 hours.  Invalid input(s): CK ------------------------------------------------------------------------------------------------------------------ Invalid input(s): POCBNP   CBG:  Recent Labs Lab 05/14/16 2141 05/15/16 0034 05/15/16 0412  GLUCAP 275* 216* 207*       Studies: Ct Abdomen Pelvis W Contrast  Result Date: 05/14/2016 CLINICAL DATA:  Abdominal bloating. Denies diarrhea or constipation. History small bowel obstruction. EXAM: CT ABDOMEN AND PELVIS WITH CONTRAST TECHNIQUE: Multidetector CT imaging of the abdomen and pelvis was performed using the standard protocol following bolus administration of intravenous contrast. CONTRAST:  ISOVUE-300 IOPAMIDOL (ISOVUE-300) INJECTION 61% COMPARISON:  03/05/2013 CT FINDINGS: Lower chest: Minimal atelectasis and/or scarring medially at the right lung base. Stable since 2014 is a 2 mm left lower lobe nodular density consistent with a benign finding. Normal visualized cardiac chambers. No pericardial effusion. Hepatobiliary: Hepatic steatosis. No biliary dilatation. Physiologically distended gallbladder without calculi, pericholecystic fluid nor wall thickening. Pancreas: Normal Spleen: Normal Adrenals/Urinary Tract: Normal bilateral adrenal glands. 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 An 11mm adjacent hypodensity is seen with suggestion of internal septations. This is not significantly changed in size since  2014 however it is not a simple cyst and better characterization without and with IV contrast  either with CT or MRI is suggested. Stomach/Bowel: Small hiatal hernia. Fluid-filled moderate distention of the stomach. Small fatty focus in the gastric antrum may represent an ingested item/pill containing fat or small lipoma, measuring up to 1 cm. Normal small bowel rotation. Small bowel dilatation consistent with small-bowel obstruction with fecalized material leading up to the site of prior small bowel anastomosis where there is an abrupt transition point noted. Findings may represent stenosis at the anastomotic site accounting for the bowel dilatation. Small bowel immediately proximal to the anastomosis measures 6.7 cm in caliber. Additionally there is a ventral umbilical fat and small bowel containing hernia without incarceration. The opening of the hernia approximately 6.8 cm transverse. The anti mesenteric wall of the dilated small bowel containing fecalized material is the loop of bowel involved with this hernia. Vascular/Lymphatic: No significant vascular findings are present. No enlarged abdominal or pelvic lymph nodes. Reproductive: Uterus and bilateral adnexa are unremarkable apart from a few calcifications within the myometrium of the uterus consistent with tiny calcified fibroids or vascular calcifications. Other: No ascites or free air. Musculoskeletal: T7 through T9 anterior flowing osteophytes. Facet arthropathy L3 through S1. No listhesis. No acute osseous abnormality. IMPRESSION: 1. High-grade early or partial SBO with transition point noted at the small bowel anastomotic site possibly related to stenosis at the anastomosis. Fecalized material noted within small bowel immediately proximal to the sutures measuring up to 6.7 cm in caliber. A portion of this affected bowel is noted partially within a fat containing umbilical hernia without evidence of incarceration. 2. Stable 1.6 cm angiomyolipoma of the  right kidney. Adjacent to this lesion however is an indeterminate 1 cm hypodense lesion with slightly thickened appearing septa, Bosniak IIF lesion. Would recommend CT or MRI without and with IV contrast for better characterization and follow-up as deemed necessary based on those findings. Though stable in size, the septations appear slightly more conspicuous on current exam. 3. Tiny uterine calcifications possibly representing fibroids or vascular calcifications. 4. Hepatic steatosis. Electronically Signed   By: Tollie Eth M.D.   On: 05/14/2016 17:21   Dg Abd Portable 1v-small Bowel Obstruction Protocol-initial, 8 Hr Delay  Result Date: 05/15/2016 CLINICAL DATA:  Small bowel obstruction EXAM: PORTABLE ABDOMEN - 1 VIEW COMPARISON:  CT abdomen and pelvis May 14, 2016; abdominal radiograph May 14, 2016 FINDINGS: Nasogastric tube tip and side port in stomach. There remain loops of mildly dilated proximal small bowel. No air-fluid levels. No free air. There is contrast in the urinary bladder. IMPRESSION: Nasogastric tube tip and side port in stomach. Mild dilatation of small bowel loops in the left abdomen. Suspect a degree of residual bowel obstruction. No progression of bowel dilatation is evident. No free air. Electronically Signed   By: Bretta Bang III M.D.   On: 05/15/2016 07:52   Dg Abd Portable 1 View  Result Date: 05/14/2016 CLINICAL DATA:  NG tube verification. EXAM: PORTABLE ABDOMEN - 1 VIEW COMPARISON:  CT of the abdomen and pelvis earlier today. FINDINGS: Nasogastric tube tip lies along the greater curvature of the stomach in satisfactory position. It could be advanced several cm if desired. Small bowel gaseous distention redemonstrated primarily on the LEFT. IMPRESSION: Nasogastric tube tip lies within the stomach. Electronically Signed   By: Elsie Stain M.D.   On: 05/14/2016 21:33      Lab Results  Component Value Date   HGBA1C 6.2 (H) 02/01/2013   Lab Results  Component  Value Date   CREATININE 0.81 05/15/2016  Scheduled Meds: . aspirin EC  81 mg Oral QPM  . chlorhexidine  15 mL Mouth Rinse BID  . enoxaparin (LOVENOX) injection  40 mg Subcutaneous Q24H  . famotidine (PEPCID) IV  20 mg Intravenous Q24H  . insulin aspart  0-9 Units Subcutaneous Q4H  . mouth rinse  15 mL Mouth Rinse q12n4p  . metoprolol succinate  50 mg Oral Daily  . multivitamin  15 mL Oral Daily  . sertraline  100 mg Oral BID  . simvastatin  40 mg Oral QPM   Continuous Infusions: . lactated ringers 100 mL/hr at 05/14/16 2151     LOS: 1 day    Time spent: >30 MINS    [facility]  [facility] Pager [phone]. If 7PM-7AM, please contact night-coverage at [email or web address], password [facility] 05/15/2016, 8:53 AM  LOS: 1 day

## 2016-05-16 ENCOUNTER — Inpatient Hospital Stay (HOSPITAL_COMMUNITY): Payer: Managed Care, Other (non HMO)

## 2016-05-16 DIAGNOSIS — K5649 Other impaction of intestine: Secondary | ICD-10-CM

## 2016-05-16 LAB — GLUCOSE, CAPILLARY
GLUCOSE-CAPILLARY: 139 mg/dL — AB (ref 65–99)
GLUCOSE-CAPILLARY: 164 mg/dL — AB (ref 65–99)
Glucose-Capillary: 134 mg/dL — ABNORMAL HIGH (ref 65–99)
Glucose-Capillary: 150 mg/dL — ABNORMAL HIGH (ref 65–99)
Glucose-Capillary: 141 mg/dL — ABNORMAL HIGH (ref 65–99)
Glucose-Capillary: 160 mg/dL — ABNORMAL HIGH (ref 65–99)

## 2016-05-16 LAB — HEMOGLOBIN A1C
Hgb A1c MFr Bld: 7.6 % — ABNORMAL HIGH (ref 4.8–5.6)
Mean Plasma Glucose: 171 mg/dL

## 2016-05-16 MED ORDER — PANTOPRAZOLE SODIUM 40 MG IV SOLR
40.0000 mg | Freq: Two times a day (BID) | INTRAVENOUS | Status: DC
  Administered 2016-05-16 – 2016-05-17 (×2): 40 mg via INTRAVENOUS
  Filled 2016-05-16 (×2): qty 40

## 2016-05-16 MED ORDER — GI COCKTAIL ~~LOC~~
30.0000 mL | Freq: Two times a day (BID) | ORAL | Status: DC | PRN
  Filled 2016-05-16: qty 30

## 2016-05-16 MED ORDER — LORAZEPAM 2 MG/ML IJ SOLN
1.0000 mg | Freq: Four times a day (QID) | INTRAMUSCULAR | Status: DC | PRN
  Administered 2016-05-16: 1 mg via INTRAVENOUS
  Filled 2016-05-16: qty 1

## 2016-05-16 NOTE — Progress Notes (Signed)
[facility] PROGRESS NOTE  Lauren Schmidt ZOX:[id number] DOB: 01/02/1961 DOA: 05/14/2016   PCP: Lupe Carney, MD     Assessment/Plan: Active Problems:   SBO (small bowel obstruction)   Bowel obstruction  Lauren Schmidt is a 56 y.o. female with medical history significant of hypertension and diabetes who presents to the hospital with a chief complaint abdominal pain.  Due to persistent symptoms she presented to emergency for evaluation.Patient had similar bowel obstruction 2014 which required surgical intervention.Patient diagnosed with bowel obstruction,CT abdomen with high grade early or partial small bowel obstruction with transition point noted at the small bowel anastomotic site possibly related to stenosis at the anastomosis. Noted umbilical hernia without incarceration started on IV fluids, analgesia and entihemetics, NG tube to LIS, general surgery consulted with recommendation to admit to the medical service   Assessment and plan  1. Small bowel obstruction. recurrent  Incisional hernia   And partial SBO, surgery following.  KUB on 2/8 shows impovement. Will continue conservative care, IV fluids, IV analgesia, NG tube to low intermittent suction. advance  Diet per surgery , NG tube clamped patient was started on clears.   Following surgery recommendations.   No acute need for surgery at this time  2. Hypertension. Continue blood pressure monitoring,conttrolled , hold by mouth medications while patient NPO, will give hydralazine as needed intravenously 10 mg every 4 hours for systolic blood pressure in 180. Patient on by mouth metoprolol and lisinopril at home.    3. Type 2 diabetes mellitus. Will hold on oral hypoglycemic agents, continue insulin signed scale with glucose monitoring every 4 hours. Depending on insulin requirements will calculate basal insulin. Admission glucose 189. Target 160-180.   hemoglobin A1c 7.6  4. Anxiety. Continue alprazolam as needed.    DVT  prophylaxsis Lovenox  Code Status:  Full code    Family Communication: Discussed in detail with the patient, all imaging results, lab results explained to the patient   Disposition Plan: 2 to 3 days , as per surgery recs      Consultants:  General surgery  Procedures:  None  Antibiotics: Anti-infectives    None         HPI/Subjective: Passing flatus, does not feel well, and denies any nausea, NG tube clamped patient was started on clears  Objective: Vitals:   05/15/16 2059 05/16/16 0116 05/16/16 0515 05/16/16 0957  BP: (!) 123/57 136/70 138/65 132/72  Pulse: 93 89 75 94  Resp: 18 18 18 18   Temp: 98.9 F (37.2 C) 97.9 F (36.6 C) 97.8 F (36.6 C) 98.7 F (37.1 C)  TempSrc: Oral Oral Oral Oral  SpO2:    100%    Intake/Output Summary (Last 24 hours) at 05/16/16 1022 Last data filed at 05/16/16 0620  Gross per 24 hour  Intake                0 ml  Output              350 ml  Net             -350 ml    Exam:  Examination:  General exam: Appears calm and comfortable  Respiratory system: Clear to auscultation. Respiratory effort normal. Cardiovascular system: S1 & S2 heard, RRR. No JVD, murmurs, rubs, gallops or clicks. No pedal edema. Gastrointestinal system: Abdomen is nondistended, soft and nontender. No organomegaly or masses felt. Normal bowel sounds heard. Central nervous system: Alert and oriented. No focal neurological deficits.  Extremities: Symmetric 5 x 5 power. Skin: No rashes, lesions or ulcers Psychiatry: Judgement and insight appear normal. Mood & affect appropriate.     Data Reviewed: I have personally reviewed following labs and imaging studies  Micro Results No results found for this or any previous visit (from the past 240 hour(s)).  Radiology Reports Dg Abd 1 View  Result Date: 05/16/2016 CLINICAL DATA:  Small bowel obstruction EXAM: ABDOMEN - 1 VIEW COMPARISON:  05/15/2016 FINDINGS: NG tube remains in the proximal stomach.  Nonobstructive bowel gas pattern. Moderate stool noted throughout the colon. No free air organomegaly. IMPRESSION: Resolution of the previously seen small bowel obstruction pattern. No current evidence for obstruction. Electronically Signed   By: Charlett Nose M.D.   On: 05/16/2016 09:59   Ct Abdomen Pelvis W Contrast  Result Date: 05/14/2016 CLINICAL DATA:  Abdominal bloating. Denies diarrhea or constipation. History small bowel obstruction. EXAM: CT ABDOMEN AND PELVIS WITH CONTRAST TECHNIQUE: Multidetector CT imaging of the abdomen and pelvis was performed using the standard protocol following bolus administration of intravenous contrast. CONTRAST:  ISOVUE-300 IOPAMIDOL (ISOVUE-300) INJECTION 61% COMPARISON:  03/05/2013 CT FINDINGS: Lower chest: Minimal atelectasis and/or scarring medially at the right lung base. Stable since 2014 is a 2 mm left lower lobe nodular density consistent with a benign finding. Normal visualized cardiac chambers. No pericardial effusion. Hepatobiliary: Hepatic steatosis. No biliary dilatation. Physiologically distended gallbladder without calculi, pericholecystic fluid nor wall thickening. Pancreas: Normal Spleen: Normal Adrenals/Urinary Tract: Normal bilateral adrenal glands. 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 An 11mm adjacent hypodensity is seen with suggestion of internal septations. This is not significantly changed in size since 2014 however it is not a simple cyst and better characterization without and with IV contrast either with CT or MRI is suggested. Stomach/Bowel: Small hiatal hernia. Fluid-filled moderate distention of the stomach. Small fatty focus in the gastric antrum may represent an ingested item/pill containing fat or small lipoma, measuring up to 1 cm. Normal small bowel rotation. Small bowel dilatation consistent with small-bowel obstruction with fecalized material leading up to the site  of prior small bowel anastomosis where there is an abrupt transition point noted. Findings may represent stenosis at the anastomotic site accounting for the bowel dilatation. Small bowel immediately proximal to the anastomosis measures 6.7 cm in caliber. Additionally there is a ventral umbilical fat and small bowel containing hernia without incarceration. The opening of the hernia approximately 6.8 cm transverse. The anti mesenteric wall of the dilated small bowel containing fecalized material is the loop of bowel involved with this hernia. Vascular/Lymphatic: No significant vascular findings are present. No enlarged abdominal or pelvic lymph nodes. Reproductive: Uterus and bilateral adnexa are unremarkable apart from a few calcifications within the myometrium of the uterus consistent with tiny calcified fibroids or vascular calcifications. Other: No ascites or free air. Musculoskeletal: T7 through T9 anterior flowing osteophytes. Facet arthropathy L3 through S1. No listhesis. No acute osseous abnormality. IMPRESSION: 1. High-grade early or partial SBO with transition point noted at the small bowel anastomotic site possibly related to stenosis at the anastomosis. Fecalized material noted within small bowel immediately proximal to the sutures measuring up to 6.7 cm in caliber. A portion of this affected bowel is noted partially within a fat containing umbilical hernia without evidence of incarceration. 2. 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 Adjacent to this lesion however is an indeterminate 1 cm hypodense lesion with slightly thickened  appearing septa, Bosniak IIF lesion. Would recommend CT or MRI without and with IV contrast for better characterization and follow-up as deemed necessary based on those findings. Though stable in size, the septations appear slightly more conspicuous on current exam. 3. Tiny uterine calcifications possibly representing fibroids or vascular calcifications. 4. Hepatic  steatosis. Electronically Signed   By: Tollie Ethavid  Kwon M.D.   On: 05/14/2016 17:21   Dg Abd Portable 1v-small Bowel Obstruction Protocol-initial, 8 Hr Delay  Result Date: 05/15/2016 CLINICAL DATA:  Small bowel obstruction EXAM: PORTABLE ABDOMEN - 1 VIEW COMPARISON:  CT abdomen and pelvis May 14, 2016; abdominal radiograph May 14, 2016 FINDINGS: Nasogastric tube tip and side port in stomach. There remain loops of mildly dilated proximal small bowel. No air-fluid levels. No free air. There is contrast in the urinary bladder. IMPRESSION: Nasogastric tube tip and side port in stomach. Mild dilatation of small bowel loops in the left abdomen. Suspect a degree of residual bowel obstruction. No progression of bowel dilatation is evident. No free air. Electronically Signed   By: Bretta BangWilliam  Woodruff III M.D.   On: 05/15/2016 07:52   Dg Abd Portable 1 View  Result Date: 05/14/2016 CLINICAL DATA:  NG tube verification. EXAM: PORTABLE ABDOMEN - 1 VIEW COMPARISON:  CT of the abdomen and pelvis earlier today. FINDINGS: Nasogastric tube tip lies along the greater curvature of the stomach in satisfactory position. It could be advanced several cm if desired. Small bowel gaseous distention redemonstrated primarily on the LEFT. IMPRESSION: Nasogastric tube tip lies within the stomach. Electronically Signed   By: Elsie StainJohn T Curnes M.D.   On: 05/14/2016 21:33     CBC  Recent Labs Lab 05/14/16 1330 05/15/16 0359  WBC 11.0* 10.2  HGB 12.7 11.7*  HCT 40.3 37.2  PLT 407* 349  MCV 85.0 86.5  MCH 26.8 27.2  MCHC 31.5 31.5  RDW 14.1 14.7    Chemistries   Recent Labs Lab 05/14/16 1330 05/15/16 0359 05/15/16 0926  NA 139 138 140  K 4.9 4.3 4.1  CL 101 98* 101  CO2 25 27 27   GLUCOSE 189* 221* 217*  BUN 12 13 15   CREATININE 0.80 0.81 0.75  CALCIUM 10.3 9.3 9.3  AST 30 24  --   ALT 33 29  --   ALKPHOS 41 42  --   BILITOT 0.5 0.6  --     ------------------------------------------------------------------------------------------------------------------ CrCl cannot be calculated (Unknown ideal weight.). ------------------------------------------------------------------------------------------------------------------  Recent Labs  05/15/16 0359  HGBA1C 7.6*   ------------------------------------------------------------------------------------------------------------------ No results for input(s): CHOL, HDL, LDLCALC, TRIG, CHOLHDL, LDLDIRECT in the last 72 hours. ------------------------------------------------------------------------------------------------------------------ No results for input(s): TSH, T4TOTAL, T3FREE, THYROIDAB in the last 72 hours.  Invalid input(s): FREET3 ------------------------------------------------------------------------------------------------------------------ No results for input(s): VITAMINB12, FOLATE, FERRITIN, TIBC, IRON, RETICCTPCT in the last 72 hours.  Coagulation profile No results for input(s): INR, PROTIME in the last 168 hours.  No results for input(s): DDIMER in the last 72 hours.  Cardiac Enzymes No results for input(s): CKMB, TROPONINI, MYOGLOBIN in the last 168 hours.  Invalid input(s): CK ------------------------------------------------------------------------------------------------------------------ Invalid input(s): POCBNP   CBG:  Recent Labs Lab 05/15/16 1638 05/15/16 2017 05/16/16 0006 05/16/16 0413 05/16/16 0745  GLUCAP 150* 142* 150* 134* 139*       Studies: Dg Abd 1 View  Result Date: 05/16/2016 CLINICAL DATA:  Small bowel obstruction EXAM: ABDOMEN - 1 VIEW COMPARISON:  05/15/2016 FINDINGS: NG tube remains in the proximal stomach. Nonobstructive bowel gas pattern. Moderate stool noted throughout the colon. No  free air organomegaly. IMPRESSION: Resolution of the previously seen small bowel obstruction pattern. No current evidence for obstruction.  Electronically Signed   By: Charlett Nose M.D.   On: 05/16/2016 09:59   Ct Abdomen Pelvis W Contrast  Result Date: 05/14/2016 CLINICAL DATA:  Abdominal bloating. Denies diarrhea or constipation. History small bowel obstruction. EXAM: CT ABDOMEN AND PELVIS WITH CONTRAST TECHNIQUE: Multidetector CT imaging of the abdomen and pelvis was performed using the standard protocol following bolus administration of intravenous contrast. CONTRAST:  ISOVUE-300 IOPAMIDOL (ISOVUE-300) INJECTION 61% COMPARISON:  03/05/2013 CT FINDINGS: Lower chest: Minimal atelectasis and/or scarring medially at the right lung base. Stable since 2014 is a 2 mm left lower lobe nodular density consistent with a benign finding. Normal visualized cardiac chambers. No pericardial effusion. Hepatobiliary: Hepatic steatosis. No biliary dilatation. Physiologically distended gallbladder without calculi, pericholecystic fluid nor wall thickening. Pancreas: Normal Spleen: Normal Adrenals/Urinary Tract: Normal bilateral adrenal glands. 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 An 11mm adjacent hypodensity is seen with suggestion of internal septations. This is not significantly changed in size since 2014 however it is not a simple cyst and better characterization without and with IV contrast either with CT or MRI is suggested. Stomach/Bowel: Small hiatal hernia. Fluid-filled moderate distention of the stomach. Small fatty focus in the gastric antrum may represent an ingested item/pill containing fat or small lipoma, measuring up to 1 cm. Normal small bowel rotation. Small bowel dilatation consistent with small-bowel obstruction with fecalized material leading up to the site of prior small bowel anastomosis where there is an abrupt transition point noted. Findings may represent stenosis at the anastomotic site accounting for the bowel dilatation. Small bowel immediately proximal to the  anastomosis measures 6.7 cm in caliber. Additionally there is a ventral umbilical fat and small bowel containing hernia without incarceration. The opening of the hernia approximately 6.8 cm transverse. The anti mesenteric wall of the dilated small bowel containing fecalized material is the loop of bowel involved with this hernia. Vascular/Lymphatic: No significant vascular findings are present. No enlarged abdominal or pelvic lymph nodes. Reproductive: Uterus and bilateral adnexa are unremarkable apart from a few calcifications within the myometrium of the uterus consistent with tiny calcified fibroids or vascular calcifications. Other: No ascites or free air. Musculoskeletal: T7 through T9 anterior flowing osteophytes. Facet arthropathy L3 through S1. No listhesis. No acute osseous abnormality. IMPRESSION: 1. High-grade early or partial SBO with transition point noted at the small bowel anastomotic site possibly related to stenosis at the anastomosis. Fecalized material noted within small bowel immediately proximal to the sutures measuring up to 6.7 cm in caliber. A portion of this affected bowel is noted partially within a fat containing umbilical hernia without evidence of incarceration. 2. 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 Adjacent to this lesion however is an indeterminate 1 cm hypodense lesion with slightly thickened appearing septa, Bosniak IIF lesion. Would recommend CT or MRI without and with IV contrast for better characterization and follow-up as deemed necessary based on those findings. Though stable in size, the septations appear slightly more conspicuous on current exam. 3. Tiny uterine calcifications possibly representing fibroids or vascular calcifications. 4. Hepatic steatosis. Electronically Signed   By: Tollie Eth M.D.   On: 05/14/2016 17:21   Dg Abd Portable 1v-small Bowel Obstruction Protocol-initial, 8 Hr Delay  Result Date: 05/15/2016 CLINICAL DATA:  Small bowel obstruction  EXAM: PORTABLE ABDOMEN - 1 VIEW COMPARISON:  CT abdomen and pelvis May 14, 2016; abdominal radiograph May 14, 2016 FINDINGS: Nasogastric tube tip and side port in stomach. There remain loops of mildly dilated proximal small bowel. No air-fluid levels. No free air. There is contrast in the urinary bladder. IMPRESSION: Nasogastric tube tip and side port in stomach. Mild dilatation of small bowel loops in the left abdomen. Suspect a degree of residual bowel obstruction. No progression of bowel dilatation is evident. No free air. Electronically Signed   By: Bretta Bang III M.D.   On: 05/15/2016 07:52   Dg Abd Portable 1 View  Result Date: 05/14/2016 CLINICAL DATA:  NG tube verification. EXAM: PORTABLE ABDOMEN - 1 VIEW COMPARISON:  CT of the abdomen and pelvis earlier today. FINDINGS: Nasogastric tube tip lies along the greater curvature of the stomach in satisfactory position. It could be advanced several cm if desired. Small bowel gaseous distention redemonstrated primarily on the LEFT. IMPRESSION: Nasogastric tube tip lies within the stomach. Electronically Signed   By: Elsie Stain M.D.   On: 05/14/2016 21:33      Lab Results  Component Value Date   HGBA1C 7.6 (H) 05/15/2016   HGBA1C 6.2 (H) 02/01/2013   Lab Results  Component Value Date   CREATININE 0.75 05/15/2016       Scheduled Meds: . aspirin EC  81 mg Oral QPM  . chlorhexidine  15 mL Mouth Rinse BID  . enoxaparin (LOVENOX) injection  40 mg Subcutaneous Q24H  . famotidine (PEPCID) IV  20 mg Intravenous Q24H  . insulin aspart  0-9 Units Subcutaneous Q4H  . mouth rinse  15 mL Mouth Rinse q12n4p  . metoprolol succinate  50 mg Oral Daily  . multivitamin  15 mL Oral Daily  . sertraline  100 mg Oral BID  . simvastatin  40 mg Oral QPM   Continuous Infusions: . lactated ringers 100 mL/hr at 05/16/16 0117     LOS: 2 days    Time spent: >30 MINS    [facility]  [facility] Pager [phone]. If 7PM-7AM,  please contact night-coverage at [email or web address], password [email or web address] 05/16/2016, 10:22 AM  LOS: 2 days

## 2016-05-16 NOTE — Progress Notes (Signed)
[facility] Progress Note     Subjective: Pt with less pain. Having flatus and a small BM today. No acute events overnight.   Objective: Vital signs in last 24 hours: Temp:  [97.8 F (36.6 C)-98.9 F (37.2 C)] 98.7 F (37.1 C) (02/08 0957) Pulse Rate:  [75-104] 94 (02/08 0957) Resp:  [18-19] 18 (02/08 0957) BP: (123-147)/(51-72) 132/72 (02/08 0957) SpO2:  [96 %-100 %] 100 % (02/08 0957)    Intake/Output from previous day: 02/07 0701 - 02/08 0700 In: -  Out: 350 [Urine:200; Emesis/NG output:150] Intake/Output this shift: No intake/output data recorded.  PE: Gen:  Alert, NAD, pleasant, cooperative, obese female lying in bed Card:  RRR, no M/G/R heard Pulm:  Rate and effort normal Abd: Soft, obese, nondistended, few BS, mild TTP of upper abdomen Skin: no rashes noted, warm and dry  Lab Results:   Recent Labs  05/14/16 1330 05/15/16 0359  WBC 11.0* 10.2  HGB 12.7 11.7*  HCT 40.3 37.2  PLT 407* 349   BMET  Recent Labs  05/15/16 0359 05/15/16 0926  NA 138 140  K 4.3 4.1  CL 98* 101  CO2 27 27  GLUCOSE 221* 217*  BUN 13 15  CREATININE 0.81 0.75  CALCIUM 9.3 9.3   PT/INR No results for input(s): LABPROT, INR in the last 72 hours. CMP     Component Value Date/Time   NA 140 05/15/2016 0926   K 4.1 05/15/2016 0926   CL 101 05/15/2016 0926   CO2 27 05/15/2016 0926   GLUCOSE 217 (H) 05/15/2016 0926   BUN 15 05/15/2016 0926   CREATININE 0.75 05/15/2016 0926   CALCIUM 9.3 05/15/2016 0926   PROT 8.0 05/15/2016 0359   ALBUMIN 3.8 05/15/2016 0359   AST 24 05/15/2016 0359   ALT 29 05/15/2016 0359   ALKPHOS 42 05/15/2016 0359   BILITOT 0.6 05/15/2016 0359   GFRNONAA >60 05/15/2016 0926   GFRAA >60 05/15/2016 0926   Lipase     Component Value Date/Time   LIPASE 20 05/14/2016 1330       Studies/Results: Dg Abd 1 View  Result Date: 05/16/2016 CLINICAL DATA:  Small bowel obstruction EXAM: ABDOMEN - 1 VIEW COMPARISON:  05/15/2016  FINDINGS: NG tube remains in the proximal stomach. Nonobstructive bowel gas pattern. Moderate stool noted throughout the colon. No free air organomegaly. IMPRESSION: Resolution of the previously seen small bowel obstruction pattern. No current evidence for obstruction. Electronically Signed   By: Charlett Nose M.D.   On: 05/16/2016 09:59   Ct Abdomen Pelvis W Contrast  Result Date: 05/14/2016 CLINICAL DATA:  Abdominal bloating. Denies diarrhea or constipation. History small bowel obstruction. EXAM: CT ABDOMEN AND PELVIS WITH CONTRAST TECHNIQUE: Multidetector CT imaging of the abdomen and pelvis was performed using the standard protocol following bolus administration of intravenous contrast. CONTRAST:  ISOVUE-300 IOPAMIDOL (ISOVUE-300) INJECTION 61% COMPARISON:  03/05/2013 CT FINDINGS: Lower chest: Minimal atelectasis and/or scarring medially at the right lung base. Stable since 2014 is a 2 mm left lower lobe nodular density consistent with a benign finding. Normal visualized cardiac chambers. No pericardial effusion. Hepatobiliary: Hepatic steatosis. No biliary dilatation. Physiologically distended gallbladder without calculi, pericholecystic fluid nor wall thickening. Pancreas: Normal Spleen: Normal Adrenals/Urinary Tract: Normal bilateral adrenal glands. 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 An 11mm adjacent hypodensity is seen with suggestion of internal septations. This is not significantly changed in size since 2014 however it is  not a simple cyst and better characterization without and with IV contrast either with CT or MRI is suggested. Stomach/Bowel: Small hiatal hernia. Fluid-filled moderate distention of the stomach. Small fatty focus in the gastric antrum may represent an ingested item/pill containing fat or small lipoma, measuring up to 1 cm. Normal small bowel rotation. Small bowel dilatation consistent with small-bowel  obstruction with fecalized material leading up to the site of prior small bowel anastomosis where there is an abrupt transition point noted. Findings may represent stenosis at the anastomotic site accounting for the bowel dilatation. Small bowel immediately proximal to the anastomosis measures 6.7 cm in caliber. Additionally there is a ventral umbilical fat and small bowel containing hernia without incarceration. The opening of the hernia approximately 6.8 cm transverse. The anti mesenteric wall of the dilated small bowel containing fecalized material is the loop of bowel involved with this hernia. Vascular/Lymphatic: No significant vascular findings are present. No enlarged abdominal or pelvic lymph nodes. Reproductive: Uterus and bilateral adnexa are unremarkable apart from a few calcifications within the myometrium of the uterus consistent with tiny calcified fibroids or vascular calcifications. Other: No ascites or free air. Musculoskeletal: T7 through T9 anterior flowing osteophytes. Facet arthropathy L3 through S1. No listhesis. No acute osseous abnormality. IMPRESSION: 1. High-grade early or partial SBO with transition point noted at the small bowel anastomotic site possibly related to stenosis at the anastomosis. Fecalized material noted within small bowel immediately proximal to the sutures measuring up to 6.7 cm in caliber. A portion of this affected bowel is noted partially within a fat containing umbilical hernia without evidence of incarceration. 2. 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 Adjacent to this lesion however is an indeterminate 1 cm hypodense lesion with slightly thickened appearing septa, Bosniak IIF lesion. Would recommend CT or MRI without and with IV contrast for better characterization and follow-up as deemed necessary based on those findings. Though stable in size, the septations appear slightly more conspicuous on current exam. 3. Tiny uterine calcifications possibly  representing fibroids or vascular calcifications. 4. Hepatic steatosis. Electronically Signed   By: Tollie Ethavid  Kwon M.D.   On: 05/14/2016 17:21   Dg Abd Portable 1v-small Bowel Obstruction Protocol-initial, 8 Hr Delay  Result Date: 05/15/2016 CLINICAL DATA:  Small bowel obstruction EXAM: PORTABLE ABDOMEN - 1 VIEW COMPARISON:  CT abdomen and pelvis May 14, 2016; abdominal radiograph May 14, 2016 FINDINGS: Nasogastric tube tip and side port in stomach. There remain loops of mildly dilated proximal small bowel. No air-fluid levels. No free air. There is contrast in the urinary bladder. IMPRESSION: Nasogastric tube tip and side port in stomach. Mild dilatation of small bowel loops in the left abdomen. Suspect a degree of residual bowel obstruction. No progression of bowel dilatation is evident. No free air. Electronically Signed   By: Bretta BangWilliam  Woodruff III M.D.   On: 05/15/2016 07:52   Dg Abd Portable 1 View  Result Date: 05/14/2016 CLINICAL DATA:  NG tube verification. EXAM: PORTABLE ABDOMEN - 1 VIEW COMPARISON:  CT of the abdomen and pelvis earlier today. FINDINGS: Nasogastric tube tip lies along the greater curvature of the stomach in satisfactory position. It could be advanced several cm if desired. Small bowel gaseous distention redemonstrated primarily on the LEFT. IMPRESSION: Nasogastric tube tip lies within the stomach. Electronically Signed   By: Elsie StainJohn T Curnes M.D.   On: 05/14/2016 21:33    Anti-infectives: Anti-infectives    None       Assessment/Plan Patient Active Problem  List   Diagnosis Date Noted  . SBO (small bowel obstruction) 05/14/2016  . Bowel obstruction 05/14/2016  . Postoperative wound infection 02/06/2013  . 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 No current evidence for obstruction.   - pt having flatus and small BM this morning - Best to fix electively   - clamp tube and advance to clears, if pt tolerates clears today can pull NG tube   LOS: 2 days    Jerre Simon , [facility] 05/16/2016, 10:51 AM Pager: [phone] Consults: [phone] Mon-Fri 7:00 am-4:30 pm Sat-Sun 7:00 am-11:30 am

## 2016-05-17 DIAGNOSIS — K56609 Unspecified intestinal obstruction, unspecified as to partial versus complete obstruction: Secondary | ICD-10-CM

## 2016-05-17 LAB — GLUCOSE, CAPILLARY
GLUCOSE-CAPILLARY: 160 mg/dL — AB (ref 65–99)
Glucose-Capillary: 149 mg/dL — ABNORMAL HIGH (ref 65–99)
Glucose-Capillary: 158 mg/dL — ABNORMAL HIGH (ref 65–99)
Glucose-Capillary: 225 mg/dL — ABNORMAL HIGH (ref 65–99)

## 2016-05-17 MED ORDER — SENNOSIDES-DOCUSATE SODIUM 8.6-50 MG PO TABS: 1.0000 | ORAL_TABLET | Freq: Two times a day (BID) | ORAL | 0 refills | Status: AC

## 2016-05-17 NOTE — Progress Notes (Signed)
Discharge orders received.  Discharge instructions and follow-up appointments reviewed with the patient.  VSS upon discharge.  IV removed and education complete.  Walked out to the main entrance. Ryker Pherigo M, RN    

## 2016-05-17 NOTE — Progress Notes (Signed)
Patient ID: Lauren LofflerLinda Schmidt, female   DOB: 04/28/1960, 56 y.o.   MRN: [id number]     Subjective: Feels "great", wants to go home.  Tol CL with +flatus and BM  Objective: Vital signs in last 24 hours: Temp:  [97.8 F (36.6 C)-98.7 F (37.1 C)] 98.2 F (36.8 C) (02/09 0450) Pulse Rate:  [64-94] 64 (02/09 0450) Resp:  [18] 18 (02/09 0450) BP: (119-147)/(48-78) 147/66 (02/09 0450) SpO2:  [97 %-100 %] 99 % (02/09 0450) Last BM Date: 05/16/16  Intake/Output from previous day: 02/08 0701 - 02/09 0700 In: 4900 [I.V.:4800; IV Piggyback:100] Out: -  Intake/Output this shift: No intake/output data recorded.  General appearance: alert, cooperative, no distress and morbidly obese GI: normal findings: soft, non-tender  Lab Results:   Recent Labs  05/14/16 1330 05/15/16 0359  WBC 11.0* 10.2  HGB 12.7 11.7*  HCT 40.3 37.2  PLT 407* 349   BMET  Recent Labs  05/15/16 0359 05/15/16 0926  NA 138 140  K 4.3 4.1  CL 98* 101  CO2 27 27  GLUCOSE 221* 217*  BUN 13 15  CREATININE 0.81 0.75  CALCIUM 9.3 9.3     Studies/Results: Dg Abd 1 View  Result Date: 05/16/2016 CLINICAL DATA:  Small bowel obstruction EXAM: ABDOMEN - 1 VIEW COMPARISON:  05/15/2016 FINDINGS: NG tube remains in the proximal stomach. Nonobstructive bowel gas pattern. Moderate stool noted throughout the colon. No free air organomegaly. IMPRESSION: Resolution of the previously seen small bowel obstruction pattern. No current evidence for obstruction. Electronically Signed   By: Charlett NoseKevin  Dover M.D.   On: 05/16/2016 09:59   Dg Abd Portable 1v-small Bowel Obstruction Protocol-initial, 8 Hr Delay  Result Date: 05/15/2016 CLINICAL DATA:  Small bowel obstruction EXAM: PORTABLE ABDOMEN - 1 VIEW COMPARISON:  CT abdomen and pelvis May 14, 2016; abdominal radiograph May 14, 2016 FINDINGS: Nasogastric tube tip and side port in stomach. There remain loops of mildly dilated proximal small bowel. No air-fluid levels. No free air.  There is contrast in the urinary bladder. IMPRESSION: Nasogastric tube tip and side port in stomach. Mild dilatation of small bowel loops in the left abdomen. Suspect a degree of residual bowel obstruction. No progression of bowel dilatation is evident. No free air. Electronically Signed   By: Bretta BangWilliam  Woodruff III M.D.   On: 05/15/2016 07:52    Anti-infectives: Anti-infectives    None      Assessment/Plan: SBO. Resolving.  [facility]GT [facility]  Could go home later today if doing well.     LOS: 3 days    Anup Brigham T 05/17/2016

## 2016-05-17 NOTE — Care Management Note (Signed)
Case Management Note  Patient Details  Name: Susa LofflerLinda Bovard MRN: [id number] Date of Birth: 10/05/1960  Subjective/Objective:                    Action/Plan: Pt discharging home with self care. Pt with insurance and PCP. No further needs per CM.   Expected Discharge Date:  05/17/16               Expected Discharge Plan:  Home/Self Care  In-House Referral:     Discharge planning Services     Post Acute Care Choice:    Choice offered to:     DME Arranged:    DME Agency:     HH Arranged:    HH Agency:     Status of Service:  Completed, signed off  If discussed at [employer] Length [employer] Stay Meetings, dates discussed:    Additional Comments:  Kermit BaloKelli F Brycen Bean, RN 05/17/2016, 3:45 PM

## 2016-05-17 NOTE — Discharge Summary (Signed)
Physician Discharge Summary  Lauren Schmidt MRN: [id number] DOB/AGE: Feb 25, 1961 56 y.o.  PCP: Lupe Carney, MD   Admit date: 05/14/2016 Discharge date: 05/17/2016  Discharge Diagnoses:    Active Problems:   SBO (small bowel obstruction)   Bowel obstruction    Follow-up recommendations Follow-up with PCP in 3-5 days , including all  additional recommended appointments as below Follow-up CBC, CMP in 3-5 days Follow-up with general surgery as scheduled      Current Discharge Medication List    START taking these medications   Details  senna-docusate (SENOKOT-S) 8.6-50 MG tablet Take 1 tablet by mouth 2 (two) times daily. Qty: 60 tablet, Refills: 0      CONTINUE these medications which have NOT CHANGED   Details  ALPRAZolam (XANAX) 0.5 MG tablet Take 0.5 mg by mouth at bedtime as needed for sleep or anxiety.    aspirin EC 81 MG tablet Take 81 mg by mouth every evening.    dapagliflozin propanediol (FARXIGA) 5 MG TABS tablet Take 5 mg by mouth daily.    metoprolol succinate (TOPROL-XL) 50 MG 24 hr tablet Take 50 mg by mouth daily. Take with or immediately following a meal.    Multiple Vitamin (MULTIVITAMIN) LIQD Take 5 mLs by mouth daily.    omeprazole (PRILOSEC) 20 MG capsule Take 40 mg by mouth daily.    sertraline (ZOLOFT) 100 MG tablet Take 100 mg by mouth 2 (two) times daily.     simvastatin (ZOCOR) 40 MG tablet Take 40 mg by mouth every evening.         Discharge Condition:Stable   Discharge Instructions Get Medicines reviewed and adjusted: Please take all your medications with you for your next visit with your Primary MD  Please request your Primary MD to go over all hospital tests and procedure/radiological results at the follow up, please ask your Primary MD to get all Hospital records sent to his/her office.  If you experience worsening of your admission symptoms, develop shortness of breath, life threatening emergency, suicidal or homicidal thoughts  you must seek medical attention immediately by calling 911 or calling your MD immediately if symptoms less severe.  You must read complete instructions/literature along with all the possible adverse reactions/side effects for all the Medicines you take and that have been prescribed to you. Take any new Medicines after you have completely understood and accpet all the possible adverse reactions/side effects.   Do not drive when taking Pain medications.   Do not take more than prescribed Pain, Sleep and Anxiety Medications  Special Instructions: If you have smoked or chewed Tobacco in the last 2 yrs please stop smoking, stop any regular Alcohol and or any Recreational drug use.  Wear Seat belts while driving.  Please note  You were cared for by a hospitalist during your hospital stay. Once you are discharged, your primary care physician will handle any further medical issues. Please note that NO REFILLS for any discharge medications will be authorized once you are discharged, as it is imperative that you return to your primary care physician (or establish a relationship with a primary care physician if you do not have one) for your aftercare needs so that they can reassess your need for medications and monitor your lab values.     Allergies  Allergen Reactions  . Adhesive [Tape]     Leave red marks   . Shellfish Allergy     Rash       Disposition: 06-Home-Health Care Svc  Consults:  General surgery    Significant Diagnostic Studies:  Dg Abd 1 View  Result Date: 05/16/2016 CLINICAL DATA:  Small bowel obstruction EXAM: ABDOMEN - 1 VIEW COMPARISON:  05/15/2016 FINDINGS: NG tube remains in the proximal stomach. Nonobstructive bowel gas pattern. Moderate stool noted throughout the colon. No free air organomegaly. IMPRESSION: Resolution of the previously seen small bowel obstruction pattern. No current evidence for obstruction. Electronically Signed   By: Charlett Nose M.D.   On:  05/16/2016 09:59   Ct Abdomen Pelvis W Contrast  Result Date: 05/14/2016 CLINICAL DATA:  Abdominal bloating. Denies diarrhea or constipation. History small bowel obstruction. EXAM: CT ABDOMEN AND PELVIS WITH CONTRAST TECHNIQUE: Multidetector CT imaging of the abdomen and pelvis was performed using the standard protocol following bolus administration of intravenous contrast. CONTRAST:  ISOVUE-300 IOPAMIDOL (ISOVUE-300) INJECTION 61% COMPARISON:  03/05/2013 CT FINDINGS: Lower chest: Minimal atelectasis and/or scarring medially at the right lung base. Stable since 2014 is a 2 mm left lower lobe nodular density consistent with a benign finding. Normal visualized cardiac chambers. No pericardial effusion. Hepatobiliary: Hepatic steatosis. No biliary dilatation. Physiologically distended gallbladder without calculi, pericholecystic fluid nor wall thickening. Pancreas: Normal Spleen: Normal Adrenals/Urinary Tract: Normal bilateral adrenal glands. 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 An 11mm adjacent hypodensity is seen with suggestion of internal septations. This is not significantly changed in size since 2014 however it is not a simple cyst and better characterization without and with IV contrast either with CT or MRI is suggested. Stomach/Bowel: Small hiatal hernia. Fluid-filled moderate distention of the stomach. Small fatty focus in the gastric antrum may represent an ingested item/pill containing fat or small lipoma, measuring up to 1 cm. Normal small bowel rotation. Small bowel dilatation consistent with small-bowel obstruction with fecalized material leading up to the site of prior small bowel anastomosis where there is an abrupt transition point noted. Findings may represent stenosis at the anastomotic site accounting for the bowel dilatation. Small bowel immediately proximal to the anastomosis measures 6.7 cm in caliber. Additionally  there is a ventral umbilical fat and small bowel containing hernia without incarceration. The opening of the hernia approximately 6.8 cm transverse. The anti mesenteric wall of the dilated small bowel containing fecalized material is the loop of bowel involved with this hernia. Vascular/Lymphatic: No significant vascular findings are present. No enlarged abdominal or pelvic lymph nodes. Reproductive: Uterus and bilateral adnexa are unremarkable apart from a few calcifications within the myometrium of the uterus consistent with tiny calcified fibroids or vascular calcifications. Other: No ascites or free air. Musculoskeletal: T7 through T9 anterior flowing osteophytes. Facet arthropathy L3 through S1. No listhesis. No acute osseous abnormality. IMPRESSION: 1. High-grade early or partial SBO with transition point noted at the small bowel anastomotic site possibly related to stenosis at the anastomosis. Fecalized material noted within small bowel immediately proximal to the sutures measuring up to 6.7 cm in caliber. A portion of this affected bowel is noted partially within a fat containing umbilical hernia without evidence of incarceration. 2. 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 Adjacent to this lesion however is an indeterminate 1 cm hypodense lesion with slightly thickened appearing septa, Bosniak IIF lesion. Would recommend CT or MRI without and with IV contrast for better characterization and follow-up as deemed necessary based on those findings. Though stable in size, the septations appear slightly more conspicuous on current exam. 3. Tiny uterine calcifications possibly representing  fibroids or vascular calcifications. 4. Hepatic steatosis. Electronically Signed   By: Tollie Ethavid  Kwon M.D.   On: 05/14/2016 17:21   Dg Abd Portable 1v-small Bowel Obstruction Protocol-initial, 8 Hr Delay  Result Date: 05/15/2016 CLINICAL DATA:  Small bowel obstruction EXAM: PORTABLE ABDOMEN - 1 VIEW COMPARISON:  CT  abdomen and pelvis May 14, 2016; abdominal radiograph May 14, 2016 FINDINGS: Nasogastric tube tip and side port in stomach. There remain loops of mildly dilated proximal small bowel. No air-fluid levels. No free air. There is contrast in the urinary bladder. IMPRESSION: Nasogastric tube tip and side port in stomach. Mild dilatation of small bowel loops in the left abdomen. Suspect a degree of residual bowel obstruction. No progression of bowel dilatation is evident. No free air. Electronically Signed   By: Bretta BangWilliam  Woodruff III M.D.   On: 05/15/2016 07:52   Dg Abd Portable 1 View  Result Date: 05/14/2016 CLINICAL DATA:  NG tube verification. EXAM: PORTABLE ABDOMEN - 1 VIEW COMPARISON:  CT of the abdomen and pelvis earlier today. FINDINGS: Nasogastric tube tip lies along the greater curvature of the stomach in satisfactory position. It could be advanced several cm if desired. Small bowel gaseous distention redemonstrated primarily on the LEFT. IMPRESSION: Nasogastric tube tip lies within the stomach. Electronically Signed   By: Elsie StainJohn T Curnes M.D.   On: 05/14/2016 21:33           Blood Culture    Component Value Date/Time   SDES WOUND ABDOMEN 02/06/2013 0929   SPECREQUEST NONE 02/06/2013 0929   CULT  02/06/2013 0929    MODERATE ESCHERICHIA COLI [facility] at [facility]   REPTSTATUS 02/09/2013 FINAL 02/06/2013 [id number]      Labs: Results for orders placed or performed during the hospital encounter of 05/14/16 (from the past 48 hour(s))  Glucose, capillary     Status: Abnormal   Collection Time: 05/15/16 11:15 AM  Result Value Ref Range   Glucose-Capillary 183 (H) 65 - 99 mg/dL  Glucose, capillary     Status: Abnormal   Collection Time: 05/15/16  4:38 PM  Result Value Ref Range   Glucose-Capillary 150 (H) 65 - 99 mg/dL  Glucose, capillary     Status: Abnormal   Collection Time: 05/15/16  8:17 PM  Result Value Ref Range   Glucose-Capillary 142 (H) 65 - 99 mg/dL   Comment  1 Notify RN    Comment 2 Document in Chart   Glucose, capillary     Status: Abnormal   Collection Time: 05/16/16 12:06 AM  Result Value Ref Range   Glucose-Capillary 150 (H) 65 - 99 mg/dL   Comment 1 Notify RN    Comment 2 Document in Chart   Glucose, capillary     Status: Abnormal   Collection Time: 05/16/16  4:13 AM  Result Value Ref Range   Glucose-Capillary 134 (H) 65 - 99 mg/dL   Comment 1 Notify RN    Comment 2 Document in Chart   Glucose, capillary     Status: Abnormal   Collection Time: 05/16/16  7:45 AM  Result Value Ref Range   Glucose-Capillary 139 (H) 65 - 99 mg/dL   Comment 1 Notify RN    Comment 2 Document in Chart   Glucose, capillary     Status: Abnormal   Collection Time: 05/16/16 11:33 AM  Result Value Ref Range   Glucose-Capillary 141 (H) 65 - 99 mg/dL   Comment 1 Notify RN    Comment 2 Document in Chart  Glucose, capillary     Status: Abnormal   Collection Time: 05/16/16  4:55 PM  Result Value Ref Range   Glucose-Capillary 160 (H) 65 - 99 mg/dL   Comment 1 Notify RN    Comment 2 Document in Chart   Glucose, capillary     Status: Abnormal   Collection Time: 05/16/16  8:14 PM  Result Value Ref Range   Glucose-Capillary 164 (H) 65 - 99 mg/dL   Comment 1 Notify RN    Comment 2 Document in Chart   Glucose, capillary     Status: Abnormal   Collection Time: 05/17/16 12:28 AM  Result Value Ref Range   Glucose-Capillary 149 (H) 65 - 99 mg/dL   Comment 1 Notify RN    Comment 2 Document in Chart   Glucose, capillary     Status: Abnormal   Collection Time: 05/17/16  4:49 AM  Result Value Ref Range   Glucose-Capillary 158 (H) 65 - 99 mg/dL   Comment 1 Notify RN    Comment 2 Document in Chart   Glucose, capillary     Status: Abnormal   Collection Time: 05/17/16  7:49 AM  Result Value Ref Range   Glucose-Capillary 160 (H) 65 - 99 mg/dL     Lipid Panel  No results found for: CHOL, TRIG, HDL, CHOLHDL, VLDL, LDLCALC, LDLDIRECT   Lab Results  Component  Value Date   HGBA1C 7.6 (H) 05/15/2016   HGBA1C 6.2 (H) 02/01/2013     Lab Results  Component Value Date   CREATININE 0.75 05/15/2016     Lauren Schmidt a 56 y.o.femalewith medical history significant of hypertension and diabetes who presents to the hospital with a chief complaint abdominal pain.  Due to persistent symptoms she presented to emergency for evaluation.Patient had similar bowel obstruction 2014 which required surgical intervention.Patient diagnosed with bowel obstruction,CT abdomen with high grade early or partial small bowel obstruction with transition point noted at the small bowel anastomotic site possibly related to stenosis at the anastomosis. Noted umbilical hernia without incarceration started on IV fluids, analgesia and entihemetics, NG tube to LIS, general surgery consulted with recommendation to admit to the medical service   Hospital course  1.Small bowel obstruction.recurrent Incisional hernia And partial SBO, surgery following.  KUB on 2/8 shows impovement. treated with conservative care, IV fluids, IV analgesia, NG tube placed to low intermittent suction. NG tube clamped on 2/7. Patient did well and tolerated full liquid diet. Surgery would like to perform surgery electively. They recommended discharging the patient is tolerating soft diet. Patient is to follow-up with general surgery for outpatient management   2. Hypertension.Controlled, continue outpatient regimen,   Patient on by mouth metoprolol and lisinopril at home.    3. Type 2 diabetes mellitus. Resume oral hypoglycemics and home dose of  insulin  hemoglobin A1c 7.6  4. Anxiety. Continue alprazolam as needed.      Discharge Exam:   Blood pressure (!) 158/95, pulse 82, temperature 98.7 F (37.1 C), temperature source Oral, resp. rate 18, SpO2 98 %.   General exam: Appears calm and comfortable  Respiratory system: Clear to auscultation. Respiratory effort normal. Cardiovascular  system: S1 & S2 heard, RRR. No JVD, murmurs, rubs, gallops or clicks. No pedal edema. Gastrointestinal system: Abdomen is nondistended, soft and nontender. No organomegaly or masses felt. Normal bowel sounds heard. Central nervous system: Alert and oriented. No focal neurological deficits. Extremities: Symmetric 5 x 5 power. Skin: No rashes, lesions or ulcers Psychiatry: Judgement and insight  appear normal. Mood & affect appropriate   Follow-up Information    Lupe Carney, MD. Schedule an appointment as soon as possible for a visit.   Specialty:  Family Medicine Why:  Hospital follow-up in 3-5 days Contact information: [address] [place] [address] [phone]           Signed: Richarda Overlie 05/17/2016, 10:10 AM        Time spent >45 mins

## 2016-05-23 ENCOUNTER — Other Ambulatory Visit: Payer: Self-pay | Admitting: *Deleted

## 2016-05-23 ENCOUNTER — Encounter: Payer: Self-pay | Admitting: *Deleted

## 2016-05-23 NOTE — Patient Outreach (Signed)
[facility] Care Management  05/23/2016  Lauren LofflerLinda Schmidt 09/29/1960 [id number]   Subjective: Telephone call to patient's home / mobile number, spoke with patient, and HIPAA verified.   Discussed [facility] [employer] Transition of care follow up, patient voices understanding, and is in agreement to complete follow up. Patient states she is doing ok, has a follow up appointment this afternoon, and is planning to discuss her concerns on how to prevent small bowel obstruction in the future.  Patient states she knew that something was wrong when she felt extreme abdominal pain and went to the ED.  States she did not have any nausea, vomiting, or other symptoms of an obstruction.   States the extreme abdominal pain was the only symptom she had the last time when she had an obstruction in 2014 and it required surgery.  Patient states she also has a hernia.   States is aware of signs / symptoms  to notify primary MD of prior to going to ED.  Patient states she does not have a follow up appointment scheduled with surgeon since her obstruction did not require surgery but will discuss next steps with primary MD.  Patient states she does not have any transition of care, care coordination, disease management, disease monitoring, transportation, community resource, or pharmacy needs at this time.   States she is very appreciative of the follow up call and is in agreement to receive [facility] information.    Objective: Per chart review, patient hospitalized  05/14/16 - 05/17/16 for small bowel obstruction.   Patient also has a history of diabetes, hypertension, and anxiety.   Assessment: Received [employer] Transition of care referral on 05/20/16.  Transition of care follow up completed, no care management needs, and will proceed with case closure.   Plan: RNCM will send patient successful outreach letter, [facility] pamphlet, and magnet. RNCM will send case closure due to follow up completed / no  care management needs request to [facility]Laura [facility].   Christelle Igoe H. Gardiner Barefootooper RN, BSN, CCM [facility] Care [facility] Telephonic CM Phone: [phone] Fax: [phone]

## 2016-06-17 ENCOUNTER — Ambulatory Visit: Payer: Self-pay | Admitting: General Surgery

## 2016-06-25 NOTE — Pre-Procedure Instructions (Signed)
Edita Weyenberg  06/25/2016      [facility] - [place], [place] - [address] [place] [address] Phone: [phone] Fax: [phone]  [facility] [phone] [place], [place] - [address] AT [facility] OF [facility] & [address] [place] [address] Phone: [phone] Fax: [phone]    Your procedure is scheduled on March 26  Report to [facility] Admitting at [employer] A.M.  Call this number if you have problems the morning of surgery:  [phone]   Remember:  Do not eat food or drink liquids after midnight.   Take these medicines the morning of surgery with A SIP OF WATER ALPRAZolam  (XANAX), metoprolol succinate (TOPROL-XL), omeprazole (PRILOSEC), sertraline (ZOLOFT)  WHAT DO I DO ABOUT MY DIABETES MEDICATION  . Do not take oral diabetes medicines (pills) the morning of surgery. dapagliflozin propanediol (FARXIGA), glimepiride (AMARYL)  metFORMIN (GLUCOPHAGE)  7 days prior to surgery STOP taking any Aspirin, Aleve, Naproxen, Ibuprofen, Motrin, Advil, Goody's, BC's, all herbal medications, fish oil, and all vitamins  How to Manage Your Diabetes Before and After Surgery  Why is it important to control my blood sugar before and after surgery? . Improving blood sugar levels before and after surgery helps healing and can limit problems. . A way of improving blood sugar control is eating a healthy diet by: o  Eating less sugar and carbohydrates o  Increasing activity/exercise o  Talking with your doctor about reaching your blood sugar goals . High blood sugars (greater than 180 mg/dL) can raise your risk of infections and slow your recovery, so you will need to focus on controlling your diabetes during the weeks before surgery. . Make sure that the doctor who takes care of your diabetes knows about your planned surgery including the date and location.  How do I manage  my blood sugar before surgery? . Check your blood sugar at least 4 times a day, starting 2 days before surgery, to make sure that the level is not too high or low. o Check your blood sugar the morning of your surgery when you wake up and every 2 hours until you get to the Short Stay unit. . If your blood sugar is less than 70 mg/dL, you will need to treat for low blood sugar: o Do not take insulin. o Treat a low blood sugar (less than 70 mg/dL) with  cup of clear juice (cranberry or apple), 4 glucose tablets, OR glucose gel. o Recheck blood sugar in 15 minutes after treatment (to make sure it is greater than 70 mg/dL). If your blood sugar is not greater than 70 mg/dL on recheck, call [phone] for further instructions. . Report your blood sugar to the short stay nurse when you get to Short Stay.  . If you are admitted to the hospital after surgery: o Your blood sugar will be checked by the staff and you will probably be given insulin after surgery (instead of oral diabetes medicines) to make sure you have good blood sugar levels. o The goal for blood sugar control after surgery is 80-180 mg/dL.    Do not wear jewelry, make-up or nail polish.  Do not wear lotions, powders, or perfumes, or deoderant.  Do not shave 48 hours prior to surgery.  Men may shave face and neck.  Do not bring valuables to the hospital.  [facility] is not responsible for any  belongings or valuables.    Contacts, dentures or bridgework may not be worn into surgery.  Leave your suitcase in the car.  After surgery it may be brought to your room.  For patients admitted to the hospital, discharge time will be determined by your treatment team.  Patients discharged the day of surgery will not be allowed to drive home.    Special instructions:   [facility]- Preparing For Surgery  Before surgery, you can play an important role. Because skin is not sterile, your skin needs to be as free of germs as possible. You can  reduce the number of germs on your skin by washing with CHG (chlorahexidine gluconate) Soap before surgery.  CHG is an antiseptic cleaner which kills germs and bonds with the skin to continue killing germs even after washing.  Please do not use if you have an allergy to CHG or antibacterial soaps. If your skin becomes reddened/irritated stop using the CHG.  Do not shave (including legs and underarms) for at least 48 hours prior to first CHG shower. It is OK to shave your face.  Please follow these instructions carefully.   1. Shower the NIGHT BEFORE SURGERY and the MORNING OF SURGERY with CHG.   2. If you chose to wash your hair, wash your hair first as usual with your normal shampoo.  3. After you shampoo, rinse your hair and body thoroughly to remove the shampoo.  4. Use CHG as you would any other liquid soap. You can apply CHG directly to the skin and wash gently with a scrungie or a clean washcloth.   5. Apply the CHG Soap to your body ONLY FROM THE NECK DOWN.  Do not use on open wounds or open sores. Avoid contact with your eyes, ears, mouth and genitals (private parts). Wash genitals (private parts) with your normal soap.  6. Wash thoroughly, paying special attention to the area where your surgery will be performed.  7. Thoroughly rinse your body with warm water from the neck down.  8. DO NOT shower/wash with your normal soap after using and rinsing off the CHG Soap.  9. Pat yourself dry with a CLEAN TOWEL.   10. Wear CLEAN PAJAMAS   11. Place CLEAN SHEETS on your bed the night of your first shower and DO NOT SLEEP WITH PETS.    Day of Surgery: Do not apply any deodorants/lotions. Please wear clean clothes to the hospital/surgery center.      Please read over the following fact sheets that you were given.

## 2016-06-26 ENCOUNTER — Other Ambulatory Visit (HOSPITAL_COMMUNITY): Payer: Self-pay | Admitting: *Deleted

## 2016-06-26 ENCOUNTER — Encounter (HOSPITAL_COMMUNITY)
Admission: RE | Admit: 2016-06-26 | Discharge: 2016-06-26 | Disposition: A | Discharge status: Home / Self Care | Payer: Managed Care, Other (non HMO) | Source: Ambulatory Visit | Attending: General Surgery | Admitting: General Surgery

## 2016-06-26 ENCOUNTER — Encounter (HOSPITAL_COMMUNITY): Payer: Self-pay

## 2016-06-26 DIAGNOSIS — Z01810 Encounter for preprocedural cardiovascular examination: Secondary | ICD-10-CM | POA: Diagnosis present

## 2016-06-26 DIAGNOSIS — I10 Essential (primary) hypertension: Secondary | ICD-10-CM | POA: Insufficient documentation

## 2016-06-26 DIAGNOSIS — K430 Incisional hernia with obstruction, without gangrene: Secondary | ICD-10-CM | POA: Insufficient documentation

## 2016-06-26 HISTORY — DX: Depression, unspecified: F32.A

## 2016-06-26 HISTORY — DX: Anemia, unspecified: D64.9

## 2016-06-26 HISTORY — DX: Fatty (change of) liver, not elsewhere classified: K76.0

## 2016-06-26 HISTORY — DX: Polyneuropathy, unspecified: G62.9

## 2016-06-26 HISTORY — DX: Major depressive disorder, single episode, unspecified: F32.9

## 2016-06-26 HISTORY — DX: Trigger thumb, left thumb: M65.312

## 2016-06-26 HISTORY — DX: Personal history of other diseases of the digestive system: Z87.19

## 2016-06-26 HISTORY — DX: Gastro-esophageal reflux disease without esophagitis: K21.9

## 2016-06-26 HISTORY — DX: Anxiety disorder, unspecified: F41.9

## 2016-06-26 HISTORY — DX: Family history of other specified conditions: Z84.89

## 2016-06-26 HISTORY — DX: Trigger thumb, right thumb: M65.311

## 2016-06-26 HISTORY — DX: Sleep apnea, unspecified: G47.30

## 2016-06-26 LAB — BASIC METABOLIC PANEL
ANION GAP: 10 (ref 5–15)
BUN: 14 mg/dL (ref 6–20)
CO2: 24 mmol/L (ref 22–32)
CREATININE: 0.71 mg/dL (ref 0.44–1.00)
Calcium: 9.5 mg/dL (ref 8.9–10.3)
Chloride: 105 mmol/L (ref 101–111)
GFR calc Af Amer: >60 mL/min (ref >60)
GFR calc non Af Amer: >60 mL/min (ref >60)
Glucose, Bld: 144 mg/dL — ABNORMAL HIGH (ref 65–99)
Potassium: 4 mmol/L (ref 3.5–5.1)
Sodium: 139 mmol/L (ref 135–145)

## 2016-06-26 LAB — CBC
HEMATOCRIT: 35.2 % — AB (ref 36.0–46.0)
Hemoglobin: 10.9 g/dL — ABNORMAL LOW (ref 12.0–15.0)
MCH: 26.5 pg (ref 26.0–34.0)
MCHC: 31 g/dL (ref 30.0–36.0)
MCV: 85.6 fL (ref 78.0–100.0)
PLATELETS: 386 10*3/uL (ref 150–400)
RBC: 4.11 MIL/uL (ref 3.87–5.11)
RDW: 14.9 % (ref 11.5–15.5)
WBC: 7.1 10*3/uL (ref 4.0–10.5)

## 2016-06-26 LAB — GLUCOSE, CAPILLARY: GLUCOSE-CAPILLARY: 148 mg/dL — AB (ref 65–99)

## 2016-06-26 NOTE — Progress Notes (Signed)
Pt denies cardiac history, chest pain or sob. Pt is a type 2 diabetic. Last A1C was 7.6 on 05/15/16. States her fasting blood sugar is usually between 120-170.

## 2016-06-26 NOTE — Pre-Procedure Instructions (Signed)
Lauren Schmidt  06/26/2016    Your procedure is scheduled on Monday, July 01, 2016 at 10:35 AM.   Report to [facility] Entrance "A" Admitting Office at 8:30 AM.   Call this number if you have problems the morning of surgery: [phone]   Questions prior to day of surgery, please call [phone] between 8 & 4 PM.   Remember:  Do not eat food or drink liquids after midnight Sunday, 06/30/16.  Take these medicines the morning of surgery with A SIP OF WATER: Metoprolol (Toprol XL), Omeprazole (Prilosec), Sertraline (Zoloft)  Stop Aspirin, Multivitamins and Fish Oil as of today.  Drink 8 ounce bottle of water 2 hours prior to arrival time day of surgery (drink it at 6:30 AM).   How to Manage Your Diabetes Before Surgery   Why is it important to control my blood sugar before and after surgery?   Improving blood sugar levels before and after surgery helps healing and can limit problems.  A way of improving blood sugar control is eating a healthy diet by:  - Eating less sugar and carbohydrates  - Increasing activity/exercise  - Talk with your doctor about reaching your blood sugar goals  High blood sugars (greater than 180 mg/dL) can raise your risk of infections and slow down your recovery so you will need to focus on controlling your diabetes during the weeks before surgery.  Make sure that the doctor who takes care of your diabetes knows about your planned surgery including the date and location.  How do I manage my blood sugars before surgery?   Check your blood sugar at least 4 times a day, 2 days before surgery to make sure that they are not too high or low.  Check your blood sugar the morning of your surgery when you wake up and every 2 hours until you get to the Short-Stay unit.  Treat a low blood sugar (less than 70 mg/dL) with 1/2 cup of clear juice (cranberry or apple), 4 glucose tablets, OR glucose gel.  Recheck blood sugar in 15 minutes after treatment  (to make sure it is greater than 70 mg/dL).  If blood sugar is not greater than 70 mg/dL on re-check, call [phone] for further instructions.   Report your blood sugar to the Short-Stay nurse when you get to Short-Stay.  References:  [facility], 2007 "How to Manage your Diabetes Before and After Surgery".  What do I do about my diabetes medications?   Do not take oral diabetes medicines (pills) the morning of surgery..   Do not wear jewelry, make-up or nail polish.  Do not wear lotions, powders or perfumes.  Do not shave 48 hours prior to surgery.   Do not bring valuables to the hospital.  [facility] is not responsible for any belongings or valuables.  Contacts, dentures or bridgework may not be worn into surgery.  Leave your suitcase in the car.  After surgery it may be brought to your room.  For patients admitted to the hospital, discharge time will be determined by your treatment team.  Patients discharged the day of surgery will not be allowed to drive home.   Special instructions:  [facility] [facility] - Preparing for Surgery  Before surgery, you can play an important role.  Because skin is not sterile, your skin needs to be as free of germs as possible.  You can reduce the number of germs on you skin by washing with CHG (chlorahexidine  gluconate) soap before surgery.  CHG is an antiseptic cleaner which kills germs and bonds with the skin to continue killing germs even after washing.  Please DO NOT use if you have an allergy to CHG or antibacterial soaps.  If your skin becomes reddened/irritated stop using the CHG and inform your nurse when you arrive at Short Stay.  Do not shave (including legs and underarms) for at least 48 hours prior to the first CHG shower.  You may shave your face.  Please follow these instructions carefully:   1.  Shower with CHG Soap the night before surgery and the                    morning of Surgery.  2.  If you choose to  wash your hair, wash your hair first as usual with your       normal shampoo.  3.  After you shampoo, rinse your hair and body thoroughly to remove the shampoo.  4.  Use CHG as you would any other liquid soap.  You can apply chg directly       to the skin and wash gently with scrungie or a clean washcloth.  5.  Apply the CHG Soap to your body ONLY FROM THE NECK DOWN.        Do not use on open wounds or open sores.  Avoid contact with your eyes, ears, mouth and genitals (private parts).  Wash genitals (private parts) with your normal soap.  6.  Wash thoroughly, paying special attention to the area where your surgery        will be performed.  7.  Thoroughly rinse your body with warm water from the neck down.  8.  DO NOT shower/wash with your normal soap after using and rinsing off       the CHG Soap.  9.  Pat yourself dry with a clean towel.            10.  Wear clean pajamas.            11.  Place clean sheets on your bed the night of your first shower and do not        sleep with pets.  Day of Surgery  Do not apply any lotions the morning of surgery.  Please wear clean clothes to the hospital.   Please read over the fact sheets that you were given.

## 2016-06-28 MED ORDER — GABAPENTIN 300 MG PO CAPS
300.0000 mg | ORAL_CAPSULE | ORAL | Status: AC
  Administered 2016-07-01: 300 mg via ORAL
  Filled 2016-06-28: qty 1

## 2016-06-28 MED ORDER — CELECOXIB 200 MG PO CAPS
400.0000 mg | ORAL_CAPSULE | ORAL | Status: AC
  Administered 2016-07-01: 400 mg via ORAL
  Filled 2016-06-28: qty 2

## 2016-06-28 MED ORDER — CEFAZOLIN SODIUM-DEXTROSE 2-4 GM/100ML-% IV SOLN
2.0000 g | INTRAVENOUS | Status: AC
  Administered 2016-07-01: 2 g via INTRAVENOUS
  Filled 2016-06-28: qty 100

## 2016-07-01 ENCOUNTER — Inpatient Hospital Stay (HOSPITAL_COMMUNITY)
Admission: RE | Admit: 2016-07-01 | Discharge: 2016-07-05 | DRG: 354 | Disposition: A | Discharge status: Home / Self Care | Payer: Managed Care, Other (non HMO) | Source: Ambulatory Visit | Attending: General Surgery | Admitting: General Surgery

## 2016-07-01 ENCOUNTER — Encounter (HOSPITAL_COMMUNITY): Payer: Self-pay | Admitting: General Practice

## 2016-07-01 ENCOUNTER — Inpatient Hospital Stay (HOSPITAL_COMMUNITY): Payer: Managed Care, Other (non HMO) | Admitting: Anesthesiology

## 2016-07-01 ENCOUNTER — Encounter (HOSPITAL_COMMUNITY)
Admission: RE | Disposition: A | Discharge status: Home / Self Care | Payer: Self-pay | Source: Ambulatory Visit | Attending: General Surgery

## 2016-07-01 ENCOUNTER — Inpatient Hospital Stay (HOSPITAL_COMMUNITY): Payer: Managed Care, Other (non HMO) | Admitting: Emergency Medicine

## 2016-07-01 DIAGNOSIS — Z8042 Family history of malignant neoplasm of prostate: Secondary | ICD-10-CM | POA: Diagnosis not present

## 2016-07-01 DIAGNOSIS — Z6841 Body Mass Index (BMI) 40.0 and over, adult: Secondary | ICD-10-CM | POA: Diagnosis not present

## 2016-07-01 DIAGNOSIS — G4730 Sleep apnea, unspecified: Secondary | ICD-10-CM | POA: Diagnosis present

## 2016-07-01 DIAGNOSIS — K430 Incisional hernia with obstruction, without gangrene: Principal | ICD-10-CM | POA: Diagnosis present

## 2016-07-01 DIAGNOSIS — Z91048 Other nonmedicinal substance allergy status: Secondary | ICD-10-CM

## 2016-07-01 DIAGNOSIS — K219 Gastro-esophageal reflux disease without esophagitis: Secondary | ICD-10-CM | POA: Diagnosis present

## 2016-07-01 DIAGNOSIS — E119 Type 2 diabetes mellitus without complications: Secondary | ICD-10-CM | POA: Diagnosis present

## 2016-07-01 DIAGNOSIS — Z8249 Family history of ischemic heart disease and other diseases of the circulatory system: Secondary | ICD-10-CM | POA: Diagnosis not present

## 2016-07-01 DIAGNOSIS — Z91013 Allergy to seafood: Secondary | ICD-10-CM

## 2016-07-01 DIAGNOSIS — Z7982 Long term (current) use of aspirin: Secondary | ICD-10-CM | POA: Diagnosis not present

## 2016-07-01 DIAGNOSIS — Z833 Family history of diabetes mellitus: Secondary | ICD-10-CM | POA: Diagnosis not present

## 2016-07-01 DIAGNOSIS — Z87891 Personal history of nicotine dependence: Secondary | ICD-10-CM

## 2016-07-01 DIAGNOSIS — I10 Essential (primary) hypertension: Secondary | ICD-10-CM | POA: Diagnosis present

## 2016-07-01 DIAGNOSIS — K436 Other and unspecified ventral hernia with obstruction, without gangrene: Secondary | ICD-10-CM | POA: Diagnosis present

## 2016-07-01 DIAGNOSIS — K567 Ileus, unspecified: Secondary | ICD-10-CM | POA: Diagnosis not present

## 2016-07-01 DIAGNOSIS — Z7984 Long term (current) use of oral hypoglycemic drugs: Secondary | ICD-10-CM | POA: Diagnosis not present

## 2016-07-01 HISTORY — PX: VENTRAL HERNIA REPAIR: SHX424

## 2016-07-01 HISTORY — PX: LAPAROSCOPIC ASSISTED VENTRAL HERNIA REPAIR: SHX6312

## 2016-07-01 HISTORY — PX: INSERTION OF MESH: SHX5868

## 2016-07-01 LAB — GLUCOSE, CAPILLARY
GLUCOSE-CAPILLARY: 159 mg/dL — AB (ref 65–99)
Glucose-Capillary: 122 mg/dL — ABNORMAL HIGH (ref 65–99)
Glucose-Capillary: 171 mg/dL — ABNORMAL HIGH (ref 65–99)

## 2016-07-01 LAB — TYPE AND SCREEN
ABO/RH(D): A POS
Antibody Screen: NEGATIVE

## 2016-07-01 LAB — ABO/RH: ABO/RH(D): A POS

## 2016-07-01 SURGERY — REPAIR, HERNIA, VENTRAL
Anesthesia: General | Site: Abdomen

## 2016-07-01 MED ORDER — 0.9 % SODIUM CHLORIDE (POUR BTL) OPTIME
TOPICAL | Status: DC | PRN
  Administered 2016-07-01: 1000 mL

## 2016-07-01 MED ORDER — MORPHINE SULFATE (PF) 2 MG/ML IV SOLN
1.0000 mg | INTRAVENOUS | Status: DC | PRN
  Administered 2016-07-01 – 2016-07-02 (×6): 4 mg via INTRAVENOUS
  Administered 2016-07-03 (×2): 2 mg via INTRAVENOUS
  Filled 2016-07-01 (×2): qty 2
  Filled 2016-07-01 (×2): qty 1
  Filled 2016-07-01 (×6): qty 2

## 2016-07-01 MED ORDER — FENTANYL CITRATE (PF) 100 MCG/2ML IJ SOLN
25.0000 ug | INTRAMUSCULAR | Status: DC | PRN
  Administered 2016-07-01 (×2): 50 ug via INTRAVENOUS

## 2016-07-01 MED ORDER — DEXAMETHASONE SODIUM PHOSPHATE 10 MG/ML IJ SOLN
INTRAMUSCULAR | Status: AC
  Filled 2016-07-01: qty 1

## 2016-07-01 MED ORDER — PHENYLEPHRINE 40 MCG/ML (10ML) SYRINGE FOR IV PUSH (FOR BLOOD PRESSURE SUPPORT)
PREFILLED_SYRINGE | INTRAVENOUS | Status: AC
  Filled 2016-07-01: qty 10

## 2016-07-01 MED ORDER — ACETAMINOPHEN 500 MG PO TABS
1000.0000 mg | ORAL_TABLET | ORAL | Status: AC
  Administered 2016-07-01: 1000 mg via ORAL
  Filled 2016-07-01: qty 2

## 2016-07-01 MED ORDER — NEOSTIGMINE METHYLSULFATE 5 MG/5ML IV SOSY
PREFILLED_SYRINGE | INTRAVENOUS | Status: AC
  Filled 2016-07-01: qty 5

## 2016-07-01 MED ORDER — ONDANSETRON HCL 4 MG/2ML IJ SOLN
INTRAMUSCULAR | Status: AC
  Filled 2016-07-01: qty 2

## 2016-07-01 MED ORDER — METHOCARBAMOL 500 MG PO TABS
ORAL_TABLET | ORAL | Status: AC
  Filled 2016-07-01: qty 1

## 2016-07-01 MED ORDER — HEPARIN SODIUM (PORCINE) 5000 UNIT/ML IJ SOLN
5000.0000 [IU] | Freq: Three times a day (TID) | INTRAMUSCULAR | Status: DC
  Administered 2016-07-02 – 2016-07-04 (×7): 5000 [IU] via SUBCUTANEOUS
  Filled 2016-07-01 (×7): qty 1

## 2016-07-01 MED ORDER — BUPIVACAINE HCL (PF) 0.25 % IJ SOLN
INTRAMUSCULAR | Status: AC
  Filled 2016-07-01: qty 30

## 2016-07-01 MED ORDER — HYDROCODONE-ACETAMINOPHEN 5-325 MG PO TABS
ORAL_TABLET | ORAL | Status: AC
  Filled 2016-07-01: qty 2

## 2016-07-01 MED ORDER — BUPIVACAINE HCL (PF) 0.25 % IJ SOLN
INTRAMUSCULAR | Status: DC | PRN
  Administered 2016-07-01: 26 mL

## 2016-07-01 MED ORDER — SERTRALINE HCL 100 MG PO TABS
200.0000 mg | ORAL_TABLET | Freq: Every day | ORAL | Status: DC
  Administered 2016-07-02 – 2016-07-05 (×4): 200 mg via ORAL
  Filled 2016-07-01 (×4): qty 2

## 2016-07-01 MED ORDER — SUGAMMADEX SODIUM 200 MG/2ML IV SOLN
INTRAVENOUS | Status: DC | PRN
  Administered 2016-07-01: 200 mg via INTRAVENOUS

## 2016-07-01 MED ORDER — OXYCODONE HCL 5 MG/5ML PO SOLN: 5.0000 mg | Freq: Once | ORAL | Status: DC | PRN

## 2016-07-01 MED ORDER — ONDANSETRON HCL 4 MG/2ML IJ SOLN
4.0000 mg | Freq: Four times a day (QID) | INTRAMUSCULAR | Status: DC | PRN
  Administered 2016-07-01 – 2016-07-03 (×5): 4 mg via INTRAVENOUS
  Filled 2016-07-01 (×5): qty 2

## 2016-07-01 MED ORDER — EPHEDRINE 5 MG/ML INJ
INTRAVENOUS | Status: AC
  Filled 2016-07-01: qty 10

## 2016-07-01 MED ORDER — ROCURONIUM BROMIDE 50 MG/5ML IV SOSY
PREFILLED_SYRINGE | INTRAVENOUS | Status: AC
  Filled 2016-07-01: qty 5

## 2016-07-01 MED ORDER — HYDROCODONE-ACETAMINOPHEN 5-325 MG PO TABS
1.0000 | ORAL_TABLET | ORAL | Status: DC | PRN
  Administered 2016-07-01 (×2): 2 via ORAL
  Filled 2016-07-01 (×2): qty 2

## 2016-07-01 MED ORDER — CHLORHEXIDINE GLUCONATE CLOTH 2 % EX PADS: 6.0000 | MEDICATED_PAD | Freq: Once | CUTANEOUS | Status: DC

## 2016-07-01 MED ORDER — GLIMEPIRIDE 4 MG PO TABS
4.0000 mg | ORAL_TABLET | Freq: Every day | ORAL | Status: DC
  Administered 2016-07-02 – 2016-07-05 (×4): 4 mg via ORAL
  Filled 2016-07-01 (×4): qty 1

## 2016-07-01 MED ORDER — POTASSIUM CHLORIDE IN NACL 20-0.9 MEQ/L-% IV SOLN
INTRAVENOUS | Status: DC
  Administered 2016-07-01: 50 mL/h via INTRAVENOUS
  Administered 2016-07-02 – 2016-07-04 (×4): via INTRAVENOUS
  Filled 2016-07-01 (×6): qty 1000

## 2016-07-01 MED ORDER — FENTANYL CITRATE (PF) 100 MCG/2ML IJ SOLN
INTRAMUSCULAR | Status: AC
  Administered 2016-07-01: 50 ug via INTRAVENOUS
  Filled 2016-07-01: qty 2

## 2016-07-01 MED ORDER — MIDAZOLAM HCL 2 MG/2ML IJ SOLN
INTRAMUSCULAR | Status: AC
  Filled 2016-07-01: qty 2

## 2016-07-01 MED ORDER — DEXAMETHASONE SODIUM PHOSPHATE 10 MG/ML IJ SOLN
INTRAMUSCULAR | Status: DC | PRN
  Administered 2016-07-01: 10 mg via INTRAVENOUS

## 2016-07-01 MED ORDER — OXYCODONE HCL 5 MG PO TABS: 5.0000 mg | ORAL_TABLET | Freq: Once | ORAL | Status: DC | PRN

## 2016-07-01 MED ORDER — ONDANSETRON HCL 4 MG/2ML IJ SOLN
INTRAMUSCULAR | Status: DC | PRN
Start: 2016-07-01 — End: 2016-07-01
  Administered 2016-07-01: 4 mg via INTRAVENOUS

## 2016-07-01 MED ORDER — MORPHINE SULFATE (PF) 4 MG/ML IV SOLN
INTRAVENOUS | Status: AC
  Administered 2016-07-01: 4 mg
  Filled 2016-07-01: qty 1

## 2016-07-01 MED ORDER — LACTATED RINGERS IV SOLN
INTRAVENOUS | Status: DC
  Administered 2016-07-01 (×3): via INTRAVENOUS

## 2016-07-01 MED ORDER — METFORMIN HCL 500 MG PO TABS
1000.0000 mg | ORAL_TABLET | Freq: Two times a day (BID) | ORAL | Status: DC
  Administered 2016-07-02 – 2016-07-05 (×7): 1000 mg via ORAL
  Filled 2016-07-01 (×7): qty 2

## 2016-07-01 MED ORDER — EPHEDRINE SULFATE 50 MG/ML IJ SOLN
INTRAMUSCULAR | Status: DC | PRN
  Administered 2016-07-01 (×2): 10 mg via INTRAVENOUS

## 2016-07-01 MED ORDER — PANTOPRAZOLE SODIUM 40 MG IV SOLR
40.0000 mg | Freq: Every day | INTRAVENOUS | Status: DC
  Administered 2016-07-01 – 2016-07-04 (×4): 40 mg via INTRAVENOUS
  Filled 2016-07-01 (×4): qty 40

## 2016-07-01 MED ORDER — FENTANYL CITRATE (PF) 100 MCG/2ML IJ SOLN
INTRAMUSCULAR | Status: DC | PRN
  Administered 2016-07-01: 50 ug via INTRAVENOUS
  Administered 2016-07-01: 100 ug via INTRAVENOUS
  Administered 2016-07-01: 50 ug via INTRAVENOUS

## 2016-07-01 MED ORDER — PHENYLEPHRINE HCL 10 MG/ML IJ SOLN
INTRAMUSCULAR | Status: DC | PRN
  Administered 2016-07-01: 80 ug via INTRAVENOUS
  Administered 2016-07-01: 120 ug via INTRAVENOUS

## 2016-07-01 MED ORDER — LISINOPRIL 20 MG PO TABS
20.0000 mg | ORAL_TABLET | Freq: Every day | ORAL | Status: DC
  Administered 2016-07-01 – 2016-07-05 (×5): 20 mg via ORAL
  Filled 2016-07-01 (×5): qty 1

## 2016-07-01 MED ORDER — METOPROLOL SUCCINATE ER 50 MG PO TB24
50.0000 mg | ORAL_TABLET | Freq: Every day | ORAL | Status: DC
  Administered 2016-07-02 – 2016-07-05 (×4): 50 mg via ORAL
  Filled 2016-07-01 (×4): qty 1

## 2016-07-01 MED ORDER — ONDANSETRON 4 MG PO TBDP: 4.0000 mg | ORAL_TABLET | Freq: Four times a day (QID) | ORAL | Status: DC | PRN

## 2016-07-01 MED ORDER — LIDOCAINE HCL (CARDIAC) 20 MG/ML IV SOLN
INTRAVENOUS | Status: DC | PRN
  Administered 2016-07-01: 80 mg via INTRAVENOUS

## 2016-07-01 MED ORDER — ROCURONIUM BROMIDE 100 MG/10ML IV SOLN
INTRAVENOUS | Status: DC | PRN
  Administered 2016-07-01: 20 mg via INTRAVENOUS
  Administered 2016-07-01: 50 mg via INTRAVENOUS

## 2016-07-01 MED ORDER — METHOCARBAMOL 500 MG PO TABS
500.0000 mg | ORAL_TABLET | Freq: Four times a day (QID) | ORAL | Status: DC | PRN
  Administered 2016-07-01 (×2): 500 mg via ORAL
  Filled 2016-07-01: qty 1

## 2016-07-01 MED ORDER — PROPOFOL 10 MG/ML IV BOLUS
INTRAVENOUS | Status: DC | PRN
  Administered 2016-07-01: 150 mg via INTRAVENOUS

## 2016-07-01 MED ORDER — FENTANYL CITRATE (PF) 100 MCG/2ML IJ SOLN
INTRAMUSCULAR | Status: AC
  Filled 2016-07-01: qty 4

## 2016-07-01 MED ORDER — PROPOFOL 10 MG/ML IV BOLUS
INTRAVENOUS | Status: AC
  Filled 2016-07-01: qty 20

## 2016-07-01 MED ORDER — MIDAZOLAM HCL 5 MG/5ML IJ SOLN
INTRAMUSCULAR | Status: DC | PRN
  Administered 2016-07-01: 2 mg via INTRAVENOUS

## 2016-07-01 MED ORDER — FENOFIBRATE 160 MG PO TABS
160.0000 mg | ORAL_TABLET | Freq: Every day | ORAL | Status: DC
  Administered 2016-07-02 – 2016-07-05 (×4): 160 mg via ORAL
  Filled 2016-07-01 (×4): qty 1

## 2016-07-01 SURGICAL SUPPLY — 50 items
BINDER ABD UNIV 10 28-50 (GAUZE/BANDAGES/DRESSINGS) ×2 IMPLANT
BINDER ABDOM UNIV 10 (GAUZE/BANDAGES/DRESSINGS) ×4
BLADE CLIPPER SURG (BLADE) IMPLANT
CANISTER SUCT 3000ML PPV (MISCELLANEOUS) ×4 IMPLANT
CHLORAPREP W/TINT 26ML (MISCELLANEOUS) ×4 IMPLANT
COVER SURGICAL LIGHT HANDLE (MISCELLANEOUS) ×4 IMPLANT
DEVICE SECURE STRAP 25 ABSORB (INSTRUMENTS) ×4 IMPLANT
DRAPE LAPAROSCOPIC ABDOMINAL (DRAPES) ×4 IMPLANT
DRAPE UTILITY XL STRL (DRAPES) ×8 IMPLANT
DRSG OPSITE POSTOP 4X8 (GAUZE/BANDAGES/DRESSINGS) ×4 IMPLANT
DRSG TEGADERM 2-3/8X2-3/4 SM (GAUZE/BANDAGES/DRESSINGS) ×4 IMPLANT
ELECT CAUTERY BLADE 6.4 (BLADE) ×4 IMPLANT
ELECT REM PT RETURN 9FT ADLT (ELECTROSURGICAL) ×4
ELECTRODE REM PT RTRN 9FT ADLT (ELECTROSURGICAL) ×2 IMPLANT
GAUZE SPONGE 2X2 8PLY STRL LF (GAUZE/BANDAGES/DRESSINGS) ×2 IMPLANT
GLOVE BIO SURGEON STRL SZ7.5 (GLOVE) ×4 IMPLANT
GLOVE SURG SS PI 6.0 STRL IVOR (GLOVE) ×8 IMPLANT
GOWN STRL REUS W/ TWL LRG LVL3 (GOWN DISPOSABLE) ×6 IMPLANT
GOWN STRL REUS W/TWL LRG LVL3 (GOWN DISPOSABLE) ×6
KIT BASIN OR (CUSTOM PROCEDURE TRAY) ×4 IMPLANT
KIT ROOM TURNOVER OR (KITS) ×4 IMPLANT
MESH VENTRALIGHT ST 6X8 (Mesh Specialty) ×2 IMPLANT
MESH VENTRLGHT ELLIPSE 8X6XMFL (Mesh Specialty) ×2 IMPLANT
NEEDLE HYPO 25GX1X1/2 BEV (NEEDLE) ×4 IMPLANT
NS IRRIG 1000ML POUR BTL (IV SOLUTION) ×4 IMPLANT
PACK GENERAL/GYN (CUSTOM PROCEDURE TRAY) ×4 IMPLANT
PAD ARMBOARD 7.5X6 YLW CONV (MISCELLANEOUS) ×4 IMPLANT
SLEEVE ENDOPATH XCEL 5M (ENDOMECHANICALS) ×8 IMPLANT
SOLUTION ANTI FOG 6CC (MISCELLANEOUS) ×4 IMPLANT
SPONGE GAUZE 2X2 STER 10/PKG (GAUZE/BANDAGES/DRESSINGS) ×2
SPONGE GAUZE 4X4 12PLY STER LF (GAUZE/BANDAGES/DRESSINGS) IMPLANT
STAPLER VISISTAT (STAPLE) ×4 IMPLANT
STAPLER VISISTAT 35W (STAPLE) IMPLANT
SUT NOVA 1 T20/GS 25DT (SUTURE) ×12 IMPLANT
SUT NOVA NAB DX-16 0-1 5-0 T12 (SUTURE) IMPLANT
SUT PROLENE 1 CT (SUTURE) IMPLANT
SUT SILK 2 0 (SUTURE) ×2
SUT SILK 2-0 18XBRD TIE 12 (SUTURE) ×2 IMPLANT
SUT VIC AB 2-0 SH 27 (SUTURE) ×2
SUT VIC AB 2-0 SH 27XBRD (SUTURE) ×2 IMPLANT
SUT VIC AB 3-0 54X BRD REEL (SUTURE) ×2 IMPLANT
SUT VIC AB 3-0 BRD 54 (SUTURE) ×2
SUT VIC AB 3-0 SH 27 (SUTURE)
SUT VIC AB 3-0 SH 27XBRD (SUTURE) IMPLANT
SYR CONTROL 10ML LL (SYRINGE) ×4 IMPLANT
TOWEL OR 17X24 6PK STRL BLUE (TOWEL DISPOSABLE) ×4 IMPLANT
TOWEL OR 17X26 10 PK STRL BLUE (TOWEL DISPOSABLE) ×4 IMPLANT
TRAY FOLEY CATH 14FRSI W/METER (CATHETERS) IMPLANT
TROCAR XCEL NON-BLD 5MMX100MML (ENDOMECHANICALS) ×4 IMPLANT
TUBING INSUFFLATOR HI FLOW (MISCELLANEOUS) ×4 IMPLANT

## 2016-07-01 NOTE — Progress Notes (Signed)
Received from PACU,patient with 8/10 pain.Patient alert and oriented, abd dressing/honeycomb intact.

## 2016-07-01 NOTE — Transfer of Care (Signed)
Immediate Anesthesia Transfer of Care Note  Patient: Lauren LofflerLinda Schmidt  Procedure(s) Performed: Procedure(s): LAPROSCOPIC ASSISTED VENTRAL HERNIA REPAIR (N/A) INSERTION OF MESH (N/A)  Patient Location: PACU  Anesthesia Type:General  Level of Consciousness: awake, alert , oriented and patient cooperative  Airway & Oxygen Therapy: Patient Spontanous Breathing and Patient connected to nasal cannula oxygen  Post-op Assessment: Report given to RN and Post -op Vital signs reviewed and stable  Post vital signs: Reviewed and stable  Last Vitals:  Vitals:   07/01/16 0834 07/01/16 1359  BP: (!) 145/72   Pulse: 82 88  Resp: 20 16  Temp: 37.2 C 36.1 C    Last Pain:  Vitals:   07/01/16 0834  TempSrc: Oral      Patients Stated Pain Goal: 2 (07/01/16 0917)  Complications: No apparent anesthesia complications

## 2016-07-01 NOTE — Anesthesia Preprocedure Evaluation (Addendum)
Anesthesia Evaluation  Patient identified by MRN, date of birth, ID band Patient awake    Reviewed: Allergy & Precautions, NPO status , Patient's Chart, lab work & pertinent test results  Airway Mallampati: II  TM Distance: >3 FB Neck ROM: Full    Dental  (+) Teeth Intact   Pulmonary sleep apnea ,    breath sounds clear to auscultation       Cardiovascular hypertension, Pt. on medications and Pt. on home beta blockers  Rhythm:Regular     Neuro/Psych PSYCHIATRIC DISORDERS Anxiety Depression  Neuromuscular disease    GI/Hepatic Neg liver ROS, hiatal hernia, GERD  ,  Endo/Other  diabetes, Type 2, Oral Hypoglycemic AgentsMorbid obesity  Renal/GU negative Renal ROS     Musculoskeletal   Abdominal   Peds  Hematology  (+) anemia ,   Anesthesia Other Findings   Reproductive/Obstetrics                            Anesthesia Physical Anesthesia Plan  ASA: III  Anesthesia Plan: General   Post-op Pain Management:    Induction: Intravenous  Airway Management Planned: Oral ETT  Additional Equipment: None  Intra-op Plan:   Post-operative Plan: Extubation in OR  Informed Consent: I have reviewed the patients History and Physical, chart, labs and discussed the procedure including the risks, benefits and alternatives for the proposed anesthesia with the patient or authorized representative who has indicated his/her understanding and acceptance.   Dental advisory given  Plan Discussed with: CRNA and Surgeon  Anesthesia Plan Comments:        Anesthesia Quick Evaluation

## 2016-07-01 NOTE — Progress Notes (Signed)
Orthopedic Tech Progress Note Patient Details:  Lauren LofflerLinda Schmidt 11/15/1960 [id number]  Ortho Devices Type of Ortho Device: Abdominal binder Ortho Device/Splint Location: abdomen Ortho Device/Splint Interventions: Lauren BoOrdered   Lauren Schmidt 07/01/2016, 2:25 PM Lauren Byrd HesselbachMaria stated that she will place order for abdominal binder

## 2016-07-01 NOTE — Op Note (Signed)
07/01/2016  1:45 PM  PATIENT:  Lauren LofflerLinda Schmidt  56 y.o. female  PRE-OPERATIVE DIAGNOSIS:  recurrent ventral incisional hernia with obstruction  POST-OPERATIVE DIAGNOSIS:  recurrent ventral incisional hernia with obstruction  PROCEDURE:  Procedure(s): VENTRAL HERNIA REPAIR  INSERTION OF MESH  LYSIS OF ADHESIONS  SURGEON:  Surgeon(s) and Role:    * Griselda MinerPaul Toth III, MD - Primary  PHYSICIAN ASSISTANT:   ASSISTANTS: Myrtie SomanSharon Hitchcock, RNFA   ANESTHESIA:   general  EBL:  Total I/O In: 1000 [I.V.:1000] Out: -   BLOOD ADMINISTERED:none  DRAINS: none   LOCAL MEDICATIONS USED:  MARCAINE     SPECIMEN:  No Specimen  DISPOSITION OF SPECIMEN:  N/A  COUNTS:  YES  TOURNIQUET:  * No tourniquets in log *  DICTATION: .Dragon Dictation   After informed consent was obtained the patient was brought to the operating room and placed in the supine position on the operating room table. After adequate induction of general anesthesia the patient's abdomen was prepped with ChloraPrep, allowed to dry, and draped in usual sterile manner. An appropriate timeout was performed. A midline incision was made mid abdomen. This incision was carried through the skin and subcutaneous tissue sharply with electrocautery until the hernia sac was identified. The hernia sac was opened sharply with the electrocautery. There was only a small area of omentum that was stuck to the wall of the sac. This was taken down sharply with electrocautery. The fascial edges were identified. The hernia sac was excised sharply with electrocautery. The small bowel was run from the ligament of Treitz to the ileocecal valve and a single adhesive band was identified that was causing some impingement on the anastomosis. His pain was lysed sharply with the electrocautery. The anastomosis itself was healthy and widely patent. At this point the bowel was placed back into the abdominal cavity. As chosen. A #1 Novafil U stitch that went through the  abdominal wall fascia and then through the mesh and then back through the abdominal wall was placed at 6 equidistant points around the edge of the mesh. The mesh was oriented with the coated side towards the bowel. Care was taken to avoid any injury to the underlying intestines. Before cinching down the mesh a 5 mm port was placed under direct vision along the left mid abdominal wall. Each of the anchoring stitches for the mesh were then cinched down and tied. The fascial rate #1 Novafil stitches. At this point the abdominal cavity was then insufflated. The mesh was in good position along the anterior abdominal wall. The gaps between the anchoring stitches were filled in with a secure strap tacker. At this point the mesh was in good position along the anterior abdominal wall with no redundancy or gaps. Area was examined and found to be hemostatic. At this point the gas was allowed to escape. The subcutaneous tissue was then closed with a running 2-0 Vicryl stitch. The skin was then closed with staples. The ports were removed. The patient tolerated the procedure well. At the end of the case all needle sponge and instrument counts were correct. The patient was then awakened and taken to recovery in stable condition.  PLAN OF CARE: Admit to inpatient   PATIENT DISPOSITION:  PACU - hemodynamically stable.   Delay start of Pharmacological VTE agent (>24hrs) due to surgical blood loss or risk of bleeding: no

## 2016-07-01 NOTE — Interval H&P Note (Signed)
History and Physical Interval Note:  07/01/2016 11:48 AM  Lauren LofflerLinda Schmidt  has presented today for surgery, with the diagnosis of recurrent ventral hernia with obstruction  The various methods of treatment have been discussed with the patient and family. After consideration of risks, benefits and other options for treatment, the patient has consented to  Procedure(s): VENTRAL HERNIA REPAIR (N/A) INSERTION OF MESH (N/A) SMALL BOWEL RESECTION (N/A) as a surgical intervention .  The patient's history has been reviewed, patient examined, no change in status, stable for surgery.  I have reviewed the patient's chart and labs.  Questions were answered to the patient's satisfaction.     TOTH III,PAUL S

## 2016-07-01 NOTE — H&P (Signed)
Lauren LofflerLinda Neighbors  [facility] Patient #: [id number] DOB: 12/11/1960 Divorced / Language: [place] / Race: White Female   History of Present Illness  The patient is a 56 year old female who presents for a follow-up for Abdominal pain. The patient is a 56 year old white female who is about 3 years status post ventral hernia repair primarily with a segmental small bowel resection from an incarcerated umbilical hernia. She was recently hospitalized with a bowel obstruction and was found to have a recurrent ventral hernia. Her obstruction resolved without surgical intervention but she continues to have some symptoms of pain and bloating. There was some question on her CT scan done in the hospital of some stricture of her anastomosis. She has not lost any weight. She does have some intermittent nausea.   Past Surgical History  Knee Surgery  Right. Resection of Small Bowel  Ventral / Umbilical Hernia Surgery  Bilateral.  Diagnostic Studies History  Colonoscopy  5-10 years ago Mammogram  within last year Pap Smear  1-5 years ago  Allergies  Adhesive Tape  Swelling Shellfish  Anaphylaxis. Allergies Reconciled   Medication History ALPRAZolam (0.5MG  Tablet, Oral as needed) Active. Aspirin (81MG  Capsule, Oral as needed) Active. Farxiga (5MG  Tablet, Oral daily) Active. Tricor (145MG  Tablet, Oral daily) Active. Amaryl (4MG  Tablet, Oral daily) Active. Lisinopril (20MG  Tablet, Oral daily) Active. Metoprolol Tartrate (50MG  Tablet, Oral daily) Active. Multi-Minerals (Oral daily) Active. Lovaza (1GM Capsule, Oral daily) Active. Zoloft (100MG  Tablet, Oral daily) Active. Zocor (40MG  Tablet, Oral daily) Active. Medications Reconciled  Social History  Alcohol use  Occasional alcohol use. Caffeine use  Carbonated beverages, Coffee, Tea. No drug use  Tobacco use  Former smoker.  Family History Arthritis  Mother, Sister. Cerebrovascular Accident   Sister. Diabetes Mellitus  Mother, Sister. Heart Disease  Mother, Sister. Heart disease in female family member before age 58  Hypertension  Father, Mother, Sister. Kidney Disease  Sister. Prostate Cancer  Father. Respiratory Condition  Father.  Pregnancy / Birth History  Age at menarche  10 years. Age of menopause  6256-58 Gravida  1 Irregular periods  Length (months) of breastfeeding  3-6 Maternal age  58-25 Para  1  Other Problems Back Pain  Depression  Diabetes Mellitus  Hemorrhoids  High blood pressure  Hypercholesterolemia  Inguinal Hernia  Other disease, cancer, significant illness  Sleep Apnea  Umbilical Hernia Repair     Review of Systems  General Not Present- Appetite Loss, Chills, Fatigue, Fever, Night Sweats, Weight Gain and Weight Loss. Skin Not Present- Change in Wart/Mole, Dryness, Hives, Jaundice, New Lesions, Non-Healing Wounds, Rash and Ulcer. HEENT Present- Wears glasses/contact lenses. Not Present- Earache, Hearing Loss, Hoarseness, Nose Bleed, Oral Ulcers, Ringing in the Ears, Seasonal Allergies, Sinus Pain, Sore Throat, Visual Disturbances and Yellow Eyes. Respiratory Not Present- Bloody sputum, Chronic Cough, Difficulty Breathing, Snoring and Wheezing. Breast Not Present- Breast Mass, Breast Pain, Nipple Discharge and Skin Changes. Cardiovascular Not Present- Chest Pain, Difficulty Breathing Lying Down, Leg Cramps, Palpitations, Rapid Heart Rate, Shortness of Breath and Swelling of Extremities. Gastrointestinal Present- Abdominal Pain, Hemorrhoids and Nausea. Not Present- Bloating, Bloody Stool, Change in Bowel Habits, Chronic diarrhea, Constipation, Difficulty Swallowing, Excessive gas, Gets full quickly at meals, Indigestion, Rectal Pain and Vomiting. Female Genitourinary Not Present- Frequency, Nocturia, Painful Urination, Pelvic Pain and Urgency. Musculoskeletal Not Present- Back Pain, Joint Pain, Joint Stiffness, Muscle Pain,  Muscle Weakness and Swelling of Extremities. Neurological Not Present- Decreased Memory, Fainting, Headaches, Numbness, Seizures, Tingling, Tremor, Trouble walking  and Weakness. Psychiatric Present- Depression. Not Present- Anxiety, Bipolar, Change in Sleep Pattern, Fearful and Frequent crying. Endocrine Present- New Diabetes. Not Present- Cold Intolerance, Excessive Hunger, Hair Changes, Heat Intolerance and Hot flashes.  Vitals  Weight: 256.2 lb Height: 59in Body Surface Area: 2.05 m Body Mass Index: 51.75 kg/m  Temp.: 98.11F  Pulse: 83 (Regular)  BP: 124/72 (Sitting, Left Arm, Standard)       Physical Exam  General Mental Status-Alert. General Appearance-Consistent with stated age. Hydration-Well hydrated. Voice-Normal.  Head and Neck Head-normocephalic, atraumatic with no lesions or palpable masses. Trachea-midline. Thyroid Gland Characteristics - normal size and consistency.  Eye Eyeball - Bilateral-Extraocular movements intact. Sclera/Conjunctiva - Bilateral-No scleral icterus.  Chest and Lung Exam Chest and lung exam reveals -quiet, even and easy respiratory effort with no use of accessory muscles and on auscultation, normal breath sounds, no adventitious sounds and normal vocal resonance. Inspection Chest Wall - Normal. Back - normal.  Cardiovascular Cardiovascular examination reveals -normal heart sounds, regular rate and rhythm with no murmurs and normal pedal pulses bilaterally.  Abdomen Note: The abdomen is soft with minimal tenderness. She is obese so it is difficult to tell if there is any abdominal distention. There is some palpable fullness near the umbilicus.   Neurologic Neurologic evaluation reveals -alert and oriented x 3 with no impairment of recent or remote memory. Mental Status-Normal.  Musculoskeletal Normal Exam - Left-Upper Extremity Strength Normal and Lower Extremity Strength Normal. Normal Exam -  Right-Upper Extremity Strength Normal and Lower Extremity Strength Normal.  Lymphatic Head & Neck  General Head & Neck Lymphatics: Bilateral - Description - Normal. Axillary  General Axillary Region: Bilateral - Description - Normal. Tenderness - Non Tender. Femoral & Inguinal  Generalized Femoral & Inguinal Lymphatics: Bilateral - Description - Normal. Tenderness - Non Tender.    Assessment & Plan  HERNIA, VENTRAL INCISIONAL, WITH OBSTRUCTION (K43.0) Impression: The patient has a history of ventral hernia repair with small bowel resection about 3 years ago and has recently been hospitalized with a recurrence of her ventral hernia with a partial bowel obstruction which resolved spontaneously. Since she is still having some symptoms from it and I think it would be reasonable to plan for a ventral hernia repair with mesh. Because of her recent obstruction I would recommend that we be able to run the bowel fully to make sure there are no adhesive bands that could cause recurrence of her bowel obstruction and I also think that we should evaluate her anastomosis to make sure it is not strictured. She understands that she could require another segmental small bowel resection. I have discussed with her in detail the risks and benefits of the operation to do this as well as some of the technical aspects and she understands and wishes to proceed.

## 2016-07-02 ENCOUNTER — Encounter (HOSPITAL_COMMUNITY): Payer: Self-pay | Admitting: General Surgery

## 2016-07-02 NOTE — Progress Notes (Signed)
1 Day Post-Op  Subjective: Complains of pain. Nauseated overnight  Objective: Vital signs in last 24 hours: Temp:  [97 F (36.1 C)-98 F (36.7 C)] 98 F (36.7 C) (03/27 0500) Pulse Rate:  [77-96] 77 (03/27 0500) Resp:  [12-27] 19 (03/27 0500) BP: (111-158)/(55-78) 135/77 (03/27 0500) SpO2:  [92 %-98 %] 98 % (03/27 0500) Weight:  [114.1 kg (251 lb 9 oz)] 114.1 kg (251 lb 9 oz) (03/26 1608) Last BM Date: 07/01/16  Intake/Output from previous day: 03/26 0701 - 03/27 0700 In: 2248.3 [P.O.:240; I.V.:2008.3] Out: 2015 [Urine:1775; Emesis/NG output:200; Blood:40] Intake/Output this shift: No intake/output data recorded.  Resp: clear to auscultation bilaterally Cardio: regular rate and rhythm GI: soft, moderate tenderness. dressing clean  Lab Results:  No results for input(s): WBC, HGB, HCT, PLT in the last 72 hours. BMET No results for input(s): NA, K, CL, CO2, GLUCOSE, BUN, CREATININE, CALCIUM in the last 72 hours. PT/INR No results for input(s): LABPROT, INR in the last 72 hours. ABG No results for input(s): PHART, HCO3 in the last 72 hours.  Invalid input(s): PCO2, PO2  Studies/Results: No results found.  Anti-infectives: Anti-infectives    Start     Dose/Rate Route Frequency Ordered Stop   07/01/16 0600  ceFAZolin (ANCEF) IVPB 2g/100 mL premix     2 g 200 mL/hr over 30 Minutes Intravenous On call to O.R. 06/28/16 1145 07/01/16 1219      Assessment/Plan: s/p Procedure(s): LAPROSCOPIC ASSISTED VENTRAL HERNIA REPAIR (N/A) INSERTION OF MESH (N/A) continue clears until bowel function returns  ambulate  LOS: 1 day    TOTH III,Dimitrious Micciche S 07/02/2016

## 2016-07-03 MED ORDER — METOCLOPRAMIDE HCL 5 MG/ML IJ SOLN
5.0000 mg | Freq: Three times a day (TID) | INTRAMUSCULAR | Status: DC | PRN
Start: 2016-07-03 — End: 2016-07-05

## 2016-07-03 NOTE — Progress Notes (Signed)
2 Days Post-Op  Subjective: Still nauseated overnight. No flatus yet  Objective: Vital signs in last 24 hours: Temp:  [98.4 F (36.9 C)-98.5 F (36.9 C)] 98.4 F (36.9 C) (03/28 0554) Pulse Rate:  [81-96] 96 (03/28 0554) Resp:  [17-18] 17 (03/28 0554) BP: (138-154)/(68-87) 154/87 (03/28 0554) SpO2:  [92 %-95 %] 92 % (03/28 0554) Last BM Date: 07/01/16 (before surgery)  Intake/Output from previous day: 03/27 0701 - 03/28 0700 In: 2919.7 [P.O.:1800; I.V.:1119.7] Out: 3475 [Urine:3300; Emesis/NG output:175] Intake/Output this shift: No intake/output data recorded.  Resp: clear to auscultation bilaterally Cardio: regular rate and rhythm GI: soft, moderate tenderness. incision looks good. no flatus yet  Lab Results:  No results for input(s): WBC, HGB, HCT, PLT in the last 72 hours. BMET No results for input(s): NA, K, CL, CO2, GLUCOSE, BUN, CREATININE, CALCIUM in the last 72 hours. PT/INR No results for input(s): LABPROT, INR in the last 72 hours. ABG No results for input(s): PHART, HCO3 in the last 72 hours.  Invalid input(s): PCO2, PO2  Studies/Results: No results found.  Anti-infectives: Anti-infectives    Start     Dose/Rate Route Frequency Ordered Stop   07/01/16 0600  ceFAZolin (ANCEF) IVPB 2g/100 mL premix     2 g 200 mL/hr over 30 Minutes Intravenous On call to O.R. 06/28/16 1145 07/01/16 1219      Assessment/Plan: s/p Procedure(s): LAPROSCOPIC ASSISTED VENTRAL HERNIA REPAIR (N/A) INSERTION OF MESH (N/A) stay with clears until bowel function returns  Ambulate Will add reglan for ileus May remove binder if uncomfortable  LOS: 2 days    TOTH III,PAUL S 07/03/2016

## 2016-07-04 LAB — GLUCOSE, CAPILLARY
GLUCOSE-CAPILLARY: 91 mg/dL (ref 65–99)
Glucose-Capillary: 141 mg/dL — ABNORMAL HIGH (ref 65–99)
Glucose-Capillary: 128 mg/dL — ABNORMAL HIGH (ref 65–99)
Glucose-Capillary: 120 mg/dL — ABNORMAL HIGH (ref 65–99)

## 2016-07-04 MED ORDER — IBUPROFEN 400 MG PO TABS
400.0000 mg | ORAL_TABLET | Freq: Four times a day (QID) | ORAL | Status: DC | PRN
  Administered 2016-07-04: 400 mg via ORAL
  Filled 2016-07-04: qty 1

## 2016-07-04 MED ORDER — INSULIN ASPART 100 UNIT/ML ~~LOC~~ SOLN: 0.0000 [IU] | Freq: Three times a day (TID) | SUBCUTANEOUS | Status: DC

## 2016-07-04 MED ORDER — IBUPROFEN 600 MG PO TABS
600.0000 mg | ORAL_TABLET | Freq: Four times a day (QID) | ORAL | Status: DC | PRN
  Administered 2016-07-04 – 2016-07-05 (×3): 600 mg via ORAL
  Filled 2016-07-04 (×3): qty 1

## 2016-07-04 NOTE — Progress Notes (Signed)
Patient requesting to have real food and to get rid of her fluids because she's hungry and she said she's drinking plenty of fluids. I asked MD Carolynne Edouardoth, he said we could KVO her line but it would be okay to give her a consistent carb diet.

## 2016-07-04 NOTE — Progress Notes (Signed)
3 Days Post-Op  Subjective: CC: tired Feels a little better today. Passing some flatus  Objective: Vital signs in last 24 hours: Temp:  [97.9 F (36.6 C)-98.6 F (37 C)] 97.9 F (36.6 C) (03/29 0527) Pulse Rate:  [86-92] 86 (03/29 0527) Resp:  [17-18] 18 (03/29 0527) BP: (142-169)/(68-89) 150/68 (03/29 0837) SpO2:  [92 %-95 %] 95 % (03/29 0527) Last BM Date: 07/01/16  Intake/Output from previous day: 03/28 0701 - 03/29 0700 In: 1740 [P.O.:800; I.V.:940] Out: 1175 [Urine:1175] Intake/Output this shift: No intake/output data recorded.  Resp: clear to auscultation bilaterally Cardio: regular rate and rhythm GI: soft, appropriately tender. incision looks good  Lab Results:  No results for input(s): WBC, HGB, HCT, PLT in the last 72 hours. BMET No results for input(s): NA, K, CL, CO2, GLUCOSE, BUN, CREATININE, CALCIUM in the last 72 hours. PT/INR No results for input(s): LABPROT, INR in the last 72 hours. ABG No results for input(s): PHART, HCO3 in the last 72 hours.  Invalid input(s): PCO2, PO2  Studies/Results: No results found.  Anti-infectives: Anti-infectives    Start     Dose/Rate Route Frequency Ordered Stop   07/01/16 0600  ceFAZolin (ANCEF) IVPB 2g/100 mL premix     2 g 200 mL/hr over 30 Minutes Intravenous On call to O.R. 06/28/16 1145 07/01/16 1219      Assessment/Plan: s/p Procedure(s): LAPROSCOPIC ASSISTED VENTRAL HERNIA REPAIR (N/A) INSERTION OF MESH (N/A) Advance diet. Start fulls today Ambulate Diabetes. Will check cbg's today and start sliding scale insulin  LOS: 3 days    TOTH III,Suhayb Anzalone S 07/04/2016

## 2016-07-05 LAB — GLUCOSE, CAPILLARY: Glucose-Capillary: 197 mg/dL — ABNORMAL HIGH (ref 65–99)

## 2016-07-05 MED ORDER — HYDROCODONE-ACETAMINOPHEN 5-325 MG PO TABS: 1.0000 | ORAL_TABLET | ORAL | 0 refills | Status: DC | PRN

## 2016-07-05 MED ORDER — PANTOPRAZOLE SODIUM 40 MG PO TBEC: 40.0000 mg | DELAYED_RELEASE_TABLET | Freq: Every day | ORAL | Status: DC

## 2016-07-05 NOTE — Progress Notes (Signed)
Lauren Schmidt to be D/C'd  per MD order. Discussed with the patient and all questions fully answered.  VSS, Skin clean, dry and intact without evidence of skin break down, no evidence of skin tears noted.  IV catheter discontinued intact. Site without signs and symptoms of complications. Dressing and pressure applied.  An After Visit Summary was printed and given to the patient. Patient received prescription.  D/c education completed with patient/family including follow up instructions, medication list, d/c activities limitations if indicated, with other d/c instructions as indicated by MD - patient able to verbalize understanding, all questions fully answered.   Patient instructed to return to ED, call 911, or call MD for any changes in condition.   Patient to be escorted via WC, and D/C home via private auto.

## 2016-07-05 NOTE — Anesthesia Postprocedure Evaluation (Addendum)
Anesthesia Post Note  Patient: Lauren Schmidt  Procedure(s) Performed: Procedure(s) (LRB): LAPROSCOPIC ASSISTED VENTRAL HERNIA REPAIR (N/A) INSERTION OF MESH (N/A)  Patient location during evaluation: PACU Anesthesia Type: General Level of consciousness: awake and alert Pain management: pain level controlled Vital Signs Assessment: post-procedure vital signs reviewed and stable Respiratory status: spontaneous breathing, nonlabored ventilation, respiratory function stable and patient connected to nasal cannula oxygen Cardiovascular status: blood pressure returned to baseline and stable Postop Assessment: no signs of nausea or vomiting Anesthetic complications: no       Last Vitals:  Vitals:   07/05/16 0505 07/05/16 0835  BP: 124/75 (!) 147/58  Pulse: 68 80  Resp: 17   Temp: 36.3 C     Last Pain:  Vitals:   07/05/16 0935  TempSrc:   PainSc: 0-No pain                 Laelah Siravo

## 2016-07-05 NOTE — Discharge Instructions (Signed)
[facility]      [facility] [phone]  OPEN ABDOMINAL SURGERY: POST OP INSTRUCTIONS  Always review your discharge instruction sheet given to you by the facility where your surgery was performed.  IF YOU HAVE DISABILITY OR FAMILY LEAVE FORMS, YOU MUST BRING THEM TO THE OFFICE FOR PROCESSING.  PLEASE DO NOT GIVE THEM TO YOUR DOCTOR.  1. A prescription for pain medication may be given to you upon discharge.  Take your pain medication as prescribed, if needed.  If narcotic pain medicine is not needed, then you may take acetaminophen (Tylenol) or ibuprofen (Advil) as needed. 2. Take your usually prescribed medications unless otherwise directed. 3. If you need a refill on your pain medication, please contact your pharmacy. They will contact our office to request authorization.  Prescriptions will not be filled after 5pm or on week-ends. 4. You should follow a light diet the first few days after arrival home, such as soup and crackers, pudding, etc.unless your doctor has advised otherwise. A high-fiber, low fat diet can be resumed as tolerated.   Be sure to include lots of fluids daily. Most patients will experience some swelling and bruising on the chest and neck area.  Ice packs will help.  Swelling and bruising can take several days to resolve 5. Most patients will experience some swelling and bruising in the area of the incision. Ice pack will help. Swelling and bruising can take several days to resolve..  6. It is common to experience some constipation if taking pain medication after surgery.  Increasing fluid intake and taking a stool softener will usually help or prevent this problem from occurring.  A mild laxative (Milk of Magnesia or Miralax) should be taken according to package directions if there are no bowel movements after 48 hours. 7.  You may have steri-strips (small skin tapes) in place directly over the incision.  These strips should be left on the skin for 7-10 days.  If your  surgeon used skin glue on the incision, you may shower in 24 hours.  The glue will flake off over the next 2-3 weeks.  Any sutures or staples will be removed at the office during your follow-up visit. You may find that a light gauze bandage over your incision may keep your staples from being rubbed or pulled. You may shower and replace the bandage daily. 8. ACTIVITIES:  You may resume regular (light) daily activities beginning the next day--such as daily self-care, walking, climbing stairs--gradually increasing activities as tolerated.  You may have sexual intercourse when it is comfortable.  Refrain from any heavy lifting or straining until approved by your doctor. a. You may drive when you no longer are taking prescription pain medication, you can comfortably wear a seatbelt, and you can safely maneuver your car and apply brakes b. Return to Work: ___________________________________ 9. You should see your doctor in the office for a follow-up appointment approximately two weeks after your surgery.  Make sure that you call for this appointment within a day or two after you arrive home to insure a convenient appointment time. OTHER INSTRUCTIONS:  _____________________________________________________________ _____________________________________________________________  WHEN TO CALL YOUR DOCTOR: 1. Fever over 101.0 2. Inability to urinate 3. Nausea and/or vomiting 4. Extreme swelling or bruising 5. Continued bleeding from incision. 6. Increased pain, redness, or drainage from the incision. 7. Difficulty swallowing or breathing 8. Muscle cramping or spasms. 9. Numbness or tingling in hands or feet or around lips.  The clinic staff is available to   answer your questions during regular business hours.  Please don't hesitate to call and ask to speak to one of the nurses if you have concerns.  For further questions, please visit [email or web address]   

## 2016-07-05 NOTE — Progress Notes (Signed)
Patient ID: Lauren Schmidt, female   DOB: July 07, 1960, 56 y.o.   MRN: [id number]   4 Days Post-Op   Subjective: No complaints. Tolerating diet. Having flatus. Once to go home.  Objective: Vital signs in last 24 hours: Temp:  [97.3 F (36.3 C)-97.9 F (36.6 C)] 97.3 F (36.3 C) (03/30 0505) Pulse Rate:  [68-87] 80 (03/30 0835) Resp:  [17] 17 (03/30 0505) BP: (124-147)/(52-75) 147/58 (03/30 0835) SpO2:  [96 %-97 %] 97 % (03/30 0505) Last BM Date: 07/01/16  Intake/Output from previous day: 03/29 0701 - 03/30 0700 In: 525 [I.V.:525] Out: 600 [Urine:600] Intake/Output this shift: Total I/O In: 240 [P.O.:240] Out: -   General appearance: alert, cooperative, no distress and morbidly obese GI: normal findings: soft, non-tender Incision/Wound: No erythema or drainage  Lab Results:  No results for input(s): WBC, HGB, HCT, PLT in the last 72 hours. BMET No results for input(s): NA, K, CL, CO2, GLUCOSE, BUN, CREATININE, CALCIUM in the last 72 hours.   Studies/Results: No results found.  Anti-infectives: Anti-infectives    Start     Dose/Rate Route Frequency Ordered Stop   07/01/16 0600  ceFAZolin (ANCEF) IVPB 2g/100 mL premix     2 g 200 mL/hr over 30 Minutes Intravenous On call to O.R. 06/28/16 1145 07/01/16 1219      Assessment/Plan: s/p Procedure(s): LAPROSCOPIC ASSISTED VENTRAL HERNIA REPAIR INSERTION OF MESH Doing well without apparent complication. Okay for discharge.   LOS: 4 days    Tajay Muzzy T 07/05/2016

## 2016-07-09 ENCOUNTER — Other Ambulatory Visit: Payer: Self-pay | Admitting: *Deleted

## 2016-07-09 ENCOUNTER — Other Ambulatory Visit: Payer: Self-pay

## 2016-07-09 NOTE — Patient Outreach (Signed)
[facility]) Care Management  07/09/2016  Lauren Schmidt 16-Sep-1960 [id number]   Subjective: Telephone call to patient's home number, no answer, left HIPAA compliant voicemail message, and requested call back.  Objective: Per chart review, patient hospitalized 07/01/16 - 07/05/16 for hernia with obstruction.  Status post VENTRAL HERNIA REPAIR, INSERTION OF MESH, and  LYSIS OF ADHESIONS on 07/01/16.   Patient also hospitalized  05/14/16 - 05/17/16 for small bowel obstruction. Patient has a history of diabetes, hypertension, and anxiety.   Assessment: Received [employer] Transition of care referral on 07/09/16.  Transition of care follow up pending patient contact.   Plan:RNCM will call patient for 2nd telephone outreach attempt, transition of care follow up, within 10 business days if no return call.   Manvir Thorson H. Gardiner Barefoot, BSN, CCM [facility] Care Management [facility] Telephonic CM Phone: [phone] Fax: [phone]

## 2016-07-11 ENCOUNTER — Other Ambulatory Visit: Payer: Self-pay | Admitting: *Deleted

## 2016-07-11 NOTE — Patient Outreach (Addendum)
[facility]) Care Management  07/11/2016  Kymberly Blomberg March 08, 1961 [id number]   Subjective: Telephone call to patient's home number, no answer, left HIPAA compliant voicemail message, and requested call back.  Objective: Per chart review, patient hospitalized 07/01/16 - 07/05/16 for hernia with obstruction.  Status post VENTRAL HERNIA REPAIR, INSERTION OF MESH, and  LYSIS OF ADHESIONS on 07/01/16.   Patient also hospitalized 05/14/16 - 05/17/16 for small bowel obstruction. Patient has a history of diabetes, hypertension, and anxiety. [facility] Care Management Transition of care follow up completed on 05/23/16.    Assessment: Received [employer] Transition of care referral on 07/09/16. Transition of care follow up pending patient contact.   Plan:RNCM will call patient for 3rd telephone outreach attempt, transition of care follow up, within 10 business days if no return call.   Asal Teas H. Gardiner Barefoot, BSN, CCM [facility] Care Management [facility] Telephonic CM Phone: [phone] Fax: [phone]

## 2016-07-12 ENCOUNTER — Encounter: Payer: Self-pay | Admitting: *Deleted

## 2016-07-12 ENCOUNTER — Other Ambulatory Visit: Payer: Self-pay | Admitting: *Deleted

## 2016-07-12 NOTE — Patient Outreach (Signed)
[facility]) Care Management  07/12/2016  Lauren Schmidt 06-07-1960 [id number]   Subjective: Telephone call to patient's home number, no answer, left HIPAA compliant voicemail message, and requested call back.  Objective: Per chart review, patient hospitalized 07/01/16 - 07/05/16 for hernia with obstruction. Status post VENTRAL HERNIA REPAIR, INSERTION OF MESH, and  LYSIS OF ADHESIONS on 07/01/16. Patient also hospitalized 05/14/16 - 05/17/16 for small bowel obstruction. Patient has a history of diabetes, hypertension, and anxiety. [facility] Care Management Transition of care follow up completed on 05/23/16.    Assessment: Received [employer] Transition of care referral on 07/09/16. Transition of care follow up pending patient contact.   Plan:RNCM will send unsuccessful outreach  letter, [facility] pamphlet, and proceed with case closure, within 10 business days if no return call.   Lauren Schmidt H. Gardiner Barefoot, BSN, CCM [facility] Care Management [facility] Telephonic CM Phone: [phone] Fax: [phone]

## 2016-07-24 NOTE — Discharge Summary (Signed)
Physician Discharge Summary  Patient ID: Lauren Schmidt MRN: [id number] DOB/AGE: [birth year] 56 y.o.  Admit date: 07/01/2016 Discharge date: 07/24/2016  Admission Diagnoses:  Discharge Diagnoses:  Active Problems:   Ventral hernia with obstruction   Discharged Condition: good  Hospital Course: the patient underwent lap assisted ventral hernia repair with mesh. She tolerated surgery well. Her postop course was uneventful. Once her pain was undercontrol and she could tolerate diet she was discharged home.  Consults: None  Significant Diagnostic Studies: none  Treatments: surgery: as above  Discharge Exam: Blood pressure (!) 147/58, pulse 80, temperature 97.3 F (36.3 C), temperature source Oral, resp. rate 17, height  (1.499 m), weight 114.1 kg (251 lb 9 oz), SpO2 97 %. General appearance: alert, cooperative and morbidly obese Resp: clear to auscultation bilaterally Cardio: regular rate and rhythm GI: soft, mild tenderness. incision looks good  Disposition: 01-Home or Self Care   Allergies as of 07/05/2016      Reactions   Adhesive [tape]    Leave red marks    Shellfish Allergy    Rash       Medication List    TAKE these medications   ALPRAZolam 0.5 MG tablet Commonly known as:  XANAX Take 0.5 mg by mouth at bedtime as needed for sleep or anxiety.   aspirin EC 81 MG tablet Take 81 mg by mouth every evening.   FARXIGA 5 MG Tabs tablet Generic drug:  dapagliflozin propanediol Take 5 mg by mouth daily.   fenofibrate 145 MG tablet Commonly known as:  TRICOR Take 145 mg by mouth daily.   glimepiride 4 MG tablet Commonly known as:  AMARYL Take 4 mg by mouth daily with breakfast.   HYDROcodone-acetaminophen 5-325 MG tablet Commonly known as:  NORCO/VICODIN Take 1-2 tablets by mouth every 4 (four) hours as needed for moderate pain.   lisinopril 20 MG tablet Commonly known as:  PRINIVIL,ZESTRIL Take 20 mg by mouth daily.   metFORMIN 500 MG  tablet Commonly known as:  GLUCOPHAGE Take 1,000 mg by mouth 2 (two) times daily with a meal. Takes 2 tablets twice a day.   metoprolol succinate 50 MG 24 hr tablet Commonly known as:  TOPROL-XL Take 50 mg by mouth daily. Take with or immediately following a meal.   multivitamin Liqd Take 5 mLs by mouth daily.   omega-3 acid ethyl esters 1 g capsule Commonly known as:  LOVAZA Take 2 g by mouth 2 (two) times daily.   omeprazole 20 MG capsule Commonly known as:  PRILOSEC Take 20 mg by mouth 2 (two) times daily before a meal.   sertraline 100 MG tablet Commonly known as:  ZOLOFT Take 200 mg by mouth daily.   simvastatin 40 MG tablet Commonly known as:  ZOCOR Take 40 mg by mouth every evening.      Follow-up Information    TOTH III,Challis Crill S, MD. Schedule an appointment as soon as possible for a visit in 2 week(s).   Specialty:  General Surgery Why:  For staple removal Contact information: [address] [place] [address] [phone]           Signed: Robyne Askew 07/24/2016, 4:07 PM

## 2016-07-26 ENCOUNTER — Other Ambulatory Visit: Payer: Self-pay | Admitting: *Deleted

## 2016-07-26 NOTE — Patient Outreach (Signed)
[facility]) Care Management  07/26/2016  Lauren Schmidt [birth year] [id number]  No response from patient outreach attempts will proceed with case closure.    Objective: Per chart review, patient hospitalized 07/01/16 - 07/05/16 for hernia with obstruction. Status post VENTRAL HERNIA REPAIR, INSERTION OF MESH, and  LYSIS OF ADHESIONS on 07/01/16. Patient also hospitalized 05/14/16 - 05/17/16 for small bowel obstruction. Patient has a history of diabetes, hypertension, and anxiety. [facility] Care Management Transition of care follow up completed on 05/23/16.   Assessment: Received [employer] Transition of care referral on 07/09/16. Transition of care follow up not completed due to unable to reach and will proceed with case closure.   Plan:RNCM will send case closure due to unable to reach request to Lauren Schmidt at [facility] Care Management.   Lauren Schmidt, BSN, CCM [facility] Care Management [facility] Telephonic CM Phone: [phone] Fax: [phone]

## 2016-08-22 ENCOUNTER — Other Ambulatory Visit: Payer: Self-pay | Admitting: General Surgery

## 2016-08-22 DIAGNOSIS — K430 Incisional hernia with obstruction, without gangrene: Secondary | ICD-10-CM

## 2016-08-23 ENCOUNTER — Ambulatory Visit
Admission: RE | Admit: 2016-08-23 | Discharge: 2016-08-23 | Disposition: A | Discharge status: Home / Self Care | Payer: Managed Care, Other (non HMO) | Source: Ambulatory Visit | Attending: General Surgery | Admitting: General Surgery

## 2016-08-23 DIAGNOSIS — K430 Incisional hernia with obstruction, without gangrene: Secondary | ICD-10-CM

## 2016-08-23 MED ORDER — IOPAMIDOL (ISOVUE-300) INJECTION 61%
100.0000 mL | Freq: Once | INTRAVENOUS | Status: AC | PRN
  Administered 2016-08-23: 125 mL via INTRAVENOUS

## 2016-08-27 ENCOUNTER — Other Ambulatory Visit (HOSPITAL_COMMUNITY): Payer: Self-pay | Admitting: General Surgery

## 2016-08-27 DIAGNOSIS — S301XXS Contusion of abdominal wall, sequela: Secondary | ICD-10-CM

## 2016-08-28 ENCOUNTER — Other Ambulatory Visit: Payer: Self-pay | Admitting: Radiology

## 2016-08-29 ENCOUNTER — Ambulatory Visit (HOSPITAL_COMMUNITY)
Admission: RE | Admit: 2016-08-29 | Discharge: 2016-08-29 | Disposition: A | Discharge status: Home / Self Care | Payer: Managed Care, Other (non HMO) | Source: Ambulatory Visit | Attending: General Surgery | Admitting: General Surgery

## 2016-08-29 ENCOUNTER — Encounter (HOSPITAL_COMMUNITY): Payer: Self-pay

## 2016-08-29 DIAGNOSIS — F329 Major depressive disorder, single episode, unspecified: Secondary | ICD-10-CM | POA: Insufficient documentation

## 2016-08-29 DIAGNOSIS — Z8249 Family history of ischemic heart disease and other diseases of the circulatory system: Secondary | ICD-10-CM | POA: Insufficient documentation

## 2016-08-29 DIAGNOSIS — E1142 Type 2 diabetes mellitus with diabetic polyneuropathy: Secondary | ICD-10-CM | POA: Diagnosis not present

## 2016-08-29 DIAGNOSIS — Z91013 Allergy to seafood: Secondary | ICD-10-CM | POA: Diagnosis not present

## 2016-08-29 DIAGNOSIS — Z833 Family history of diabetes mellitus: Secondary | ICD-10-CM | POA: Insufficient documentation

## 2016-08-29 DIAGNOSIS — G4730 Sleep apnea, unspecified: Secondary | ICD-10-CM | POA: Diagnosis not present

## 2016-08-29 DIAGNOSIS — K219 Gastro-esophageal reflux disease without esophagitis: Secondary | ICD-10-CM | POA: Insufficient documentation

## 2016-08-29 DIAGNOSIS — Z888 Allergy status to other drugs, medicaments and biological substances status: Secondary | ICD-10-CM | POA: Diagnosis not present

## 2016-08-29 DIAGNOSIS — K760 Fatty (change of) liver, not elsewhere classified: Secondary | ICD-10-CM | POA: Insufficient documentation

## 2016-08-29 DIAGNOSIS — Z7982 Long term (current) use of aspirin: Secondary | ICD-10-CM | POA: Diagnosis not present

## 2016-08-29 DIAGNOSIS — T792XXS Traumatic secondary and recurrent hemorrhage and seroma, sequela: Secondary | ICD-10-CM | POA: Diagnosis present

## 2016-08-29 DIAGNOSIS — E7800 Pure hypercholesterolemia, unspecified: Secondary | ICD-10-CM | POA: Insufficient documentation

## 2016-08-29 DIAGNOSIS — I10 Essential (primary) hypertension: Secondary | ICD-10-CM | POA: Diagnosis not present

## 2016-08-29 DIAGNOSIS — Z79899 Other long term (current) drug therapy: Secondary | ICD-10-CM | POA: Diagnosis not present

## 2016-08-29 DIAGNOSIS — R109 Unspecified abdominal pain: Secondary | ICD-10-CM | POA: Diagnosis not present

## 2016-08-29 DIAGNOSIS — X58XXXS Exposure to other specified factors, sequela: Secondary | ICD-10-CM | POA: Insufficient documentation

## 2016-08-29 DIAGNOSIS — Z7984 Long term (current) use of oral hypoglycemic drugs: Secondary | ICD-10-CM | POA: Insufficient documentation

## 2016-08-29 DIAGNOSIS — F419 Anxiety disorder, unspecified: Secondary | ICD-10-CM | POA: Insufficient documentation

## 2016-08-29 DIAGNOSIS — S301XXS Contusion of abdominal wall, sequela: Secondary | ICD-10-CM

## 2016-08-29 LAB — CBC WITH DIFFERENTIAL/PLATELET
BASOS PCT: 1 %
Basophils Absolute: 0 10*3/uL (ref 0.0–0.1)
EOS PCT: 3 %
Eosinophils Absolute: 0.3 10*3/uL (ref 0.0–0.7)
HCT: 29.4 % — ABNORMAL LOW (ref 36.0–46.0)
Hemoglobin: 9 g/dL — ABNORMAL LOW (ref 12.0–15.0)
LYMPHS ABS: 1.9 10*3/uL (ref 0.7–4.0)
Lymphocytes Relative: 23 %
MCH: 25.4 pg — AB (ref 26.0–34.0)
MCHC: 30.6 g/dL (ref 30.0–36.0)
MCV: 82.8 fL (ref 78.0–100.0)
MONOS PCT: 6 %
Monocytes Absolute: 0.6 10*3/uL (ref 0.1–1.0)
NEUTROS PCT: 67 %
Neutro Abs: 5.7 10*3/uL (ref 1.7–7.7)
PLATELETS: 542 10*3/uL — AB (ref 150–400)
RBC: 3.55 MIL/uL — ABNORMAL LOW (ref 3.87–5.11)
RDW: 14.3 % (ref 11.5–15.5)
WBC: 8.5 10*3/uL (ref 4.0–10.5)

## 2016-08-29 LAB — COMPREHENSIVE METABOLIC PANEL
ALBUMIN: 3.5 g/dL (ref 3.5–5.0)
ALT: 14 U/L (ref 14–54)
ANION GAP: 9 (ref 5–15)
AST: 16 U/L (ref 15–41)
Alkaline Phosphatase: 49 U/L (ref 38–126)
BUN: 19 mg/dL (ref 6–20)
CHLORIDE: 104 mmol/L (ref 101–111)
CO2: 28 mmol/L (ref 22–32)
Calcium: 9.3 mg/dL (ref 8.9–10.3)
Creatinine, Ser: 0.64 mg/dL (ref 0.44–1.00)
GFR calc non Af Amer: >60 mL/min (ref >60)
GLUCOSE: 124 mg/dL — AB (ref 65–99)
Potassium: 4 mmol/L (ref 3.5–5.1)
SODIUM: 141 mmol/L (ref 135–145)
Total Bilirubin: 0.3 mg/dL (ref 0.3–1.2)
Total Protein: 8.5 g/dL — ABNORMAL HIGH (ref 6.5–8.1)

## 2016-08-29 LAB — PROTIME-INR
INR: 1.13
Prothrombin Time: 14.5 seconds (ref 11.4–15.2)

## 2016-08-29 LAB — GLUCOSE, CAPILLARY: Glucose-Capillary: 148 mg/dL — ABNORMAL HIGH (ref 65–99)

## 2016-08-29 MED ORDER — MIDAZOLAM HCL 2 MG/2ML IJ SOLN
INTRAMUSCULAR | Status: AC | PRN
  Administered 2016-08-29 (×2): 1 mg via INTRAVENOUS

## 2016-08-29 MED ORDER — FENTANYL CITRATE (PF) 100 MCG/2ML IJ SOLN
INTRAMUSCULAR | Status: AC | PRN
  Administered 2016-08-29 (×2): 50 ug via INTRAVENOUS

## 2016-08-29 MED ORDER — SODIUM CHLORIDE 0.9 % IV SOLN
INTRAVENOUS | Status: DC
  Administered 2016-08-29: 12:00:00 via INTRAVENOUS

## 2016-08-29 MED ORDER — HYDROCODONE-ACETAMINOPHEN 5-325 MG PO TABS: 1.0000 | ORAL_TABLET | ORAL | Status: DC | PRN

## 2016-08-29 NOTE — Consult Note (Signed)
Chief Complaint: Patient was seen in consultation today for image guided aspiration/possible drainage of abdominal wall fluid collection.  Referring Physician(s): Toth,Paul III  Supervising Physician: Richarda Overlie  Patient Status: [facility] - Out-pt  History of Present Illness: Lauren Schmidt is a 56 y.o. female with history of ventral hernia repair with small bowel resection approximately 3 years ago and subsequent recurrence which required repeat repair with mesh placement on 07/01/16. She now presents with persistent abdominal pain, low-grade fever and imaging which reveals 5.8 x 5.1 cm fluid collection at surgical site consistent with seroma or possibly abscess. She presents today for image guided aspiration/possible drainage of the collection.  Past Medical History:  Diagnosis Date  . Anemia   . Anxiety   . Depression   . Diabetes mellitus without complication (HCC)    type 2  . Family history of adverse reaction to anesthesia    sister's bp dropped low during surgery  . Fatty liver   . GERD (gastroesophageal reflux disease)   . History of hiatal hernia   . Hypercholesterolemia   . Hypertension   . Peripheral neuropathy    feet  . Sleep apnea    uses cpap  . Trigger thumb of both thumbs   . Umbilical hernia with gangrene and obstruction 01/31/2013   Repaired on 01/31/13. SB resection done     Past Surgical History:  Procedure Laterality Date  . BOWEL RESECTION N/A 01/31/2013   Procedure: SMALL BOWEL RESECTION;  Surgeon: Robyne Askew, MD;  Location: [facility] OR;  Service: General;  Laterality: N/A;  . COLON SURGERY    . COLONOSCOPY    . HERNIA REPAIR    . INSERTION OF MESH N/A 07/01/2016   Procedure: INSERTION OF MESH;  Surgeon: Chevis Pretty III, MD;  Location: [facility] OR;  Service: General;  Laterality: N/A;  . KNEE ARTHROSCOPY Right 1990s?  Marland Kitchen LAPAROSCOPIC ASSISTED VENTRAL HERNIA REPAIR  07/01/2016  . UMBILICAL HERNIA REPAIR N/A 01/31/2013   Procedure: HERNIA REPAIR UMBILICAL  ADULT;  Surgeon: Robyne Askew, MD;  Location: [facility] OR;  Service: General;  Laterality: N/A;  . VENTRAL HERNIA REPAIR N/A 07/01/2016   Procedure: LAPROSCOPIC ASSISTED VENTRAL HERNIA REPAIR;  Surgeon: Chevis Pretty III, MD;  Location: [facility] OR;  Service: General;  Laterality: N/A;    Allergies: Macrobid [nitrofurantoin macrocrystal]; Adhesive [tape]; and Shellfish allergy  Medications: Prior to Admission medications   Medication Sig Start Date End Date Taking? Authorizing Provider  ALPRAZolam Prudy Feeler) 0.5 MG tablet Take 0.5 mg by mouth at bedtime as needed for sleep or anxiety.   Yes [provider]  aspirin EC 81 MG tablet Take 81 mg by mouth every evening.   Yes [provider]  dapagliflozin propanediol (FARXIGA) 5 MG TABS tablet Take 5 mg by mouth daily.   Yes [provider]  fenofibrate (TRICOR) 145 MG tablet Take 145 mg by mouth daily.   Yes [provider]  glimepiride (AMARYL) 4 MG tablet Take 4 mg by mouth daily with breakfast.   Yes [provider]  HYDROcodone-acetaminophen (NORCO/VICODIN) 5-325 MG tablet Take 1-2 tablets by mouth every 4 (four) hours as needed for moderate pain. 07/05/16  Yes Hoxworth, Sharlet Salina, MD  lisinopril (PRINIVIL,ZESTRIL) 20 MG tablet Take 20 mg by mouth daily.   Yes [provider]  metFORMIN (GLUCOPHAGE) 500 MG tablet Take 1,000 mg by mouth 2 (two) times daily with a meal. Takes 2 tablets twice a day.    Yes [provider]  metoprolol succinate (TOPROL-XL) 50 MG 24 hr tablet Take 50 mg by mouth daily. Take with or immediately following a meal.   Yes [provider]  Multiple Vitamin (MULTIVITAMIN) LIQD Take 5 mLs by mouth daily.   Yes [provider]  omega-3 acid ethyl esters (LOVAZA) 1 g capsule Take 2 g by mouth 2 (two) times daily.    Yes [provider]  omeprazole (PRILOSEC) 20 MG capsule Take 20 mg by mouth 2 (two) times daily before a meal.    Yes [provider]  sertraline (ZOLOFT) 100 MG tablet Take 200 mg by mouth daily.  11/22/12  Yes [provider]  simvastatin (ZOCOR) 40 MG tablet Take 40 mg by mouth every evening.   Yes [provider]     Family History  Problem Relation Age of Onset  . Heart failure Mother   . Aneurysm Mother   . Diabetes Mother     Social History   Social History  . Marital status: Divorced    Spouse name: N/A  . Number of children: N/A  . Years of education: N/A   Social History Main Topics  . Smoking status: Never Smoker  . Smokeless tobacco: Never Used  . Alcohol use No  . Drug use: No  . Sexual activity: Not Currently   Other Topics Concern  . None   Social History Narrative  . None      Review of Systems denies headache, chest pain, dyspnea, cough, nausea, vomiting; she does have mild temperature elevation, abdominal pain with small amount of blood tinged discharge at periumbilical region  Vital Signs: BP 138/71 (BP Location: Right Arm)   Pulse 94   Temp 99 F (37.2 C) (Oral)   Resp 16   SpO2 98%   Physical Exam awake, alert. Chest clear to auscultation bilaterally. Heart with regular rate and rhythm. Abdomen obese, soft, positive bowel sounds, midline wound with dressing intact, small amount of drainage noted from umbilical region, moderately tender to palpation; 1+ edema bilateral lower extremities  Mallampati Score:     Imaging: Ct Abdomen Pelvis W Contrast  Result Date: 08/23/2016 CLINICAL DATA:  Ventral incisional hernia. EXAM: CT ABDOMEN AND PELVIS WITH CONTRAST TECHNIQUE: Multidetector CT imaging of the abdomen and pelvis was performed using the standard protocol following bolus administration of intravenous contrast. CONTRAST:  125mL ISOVUE-300 IOPAMIDOL (ISOVUE-300) INJECTION 61% COMPARISON:  CT scan of May 14, 2016. FINDINGS: Lower chest: No acute abnormality. Hepatobiliary: No gallstones are noted. Fatty infiltration of the liver is noted. Pancreas:  Unremarkable. No pancreatic ductal dilatation or surrounding inflammatory changes. Spleen: Normal in size without focal abnormality. Adrenals/Urinary Tract: Adrenal glands are unremarkable. No hydronephrosis or renal obstruction is noted. Urinary bladder is unremarkable. 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 Stable 1.6 cm complex cyst seen in midpole of right kidney. Bladder is unremarkable. Stomach/Bowel: There is no evidence of bowel obstruction. No inflammatory changes are noted. The appendix is not visualized, but no inflammation is seen in the right lower quadrant. Vascular/Lymphatic: No significant vascular findings are present. No enlarged abdominal or pelvic lymph nodes. Reproductive: Uterus and bilateral adnexa are unremarkable. Other: 5.8 x 5.1 cm fluid collection is seen within midline surgical wound in anterior abdominal wall most consistent with seroma or possibly abscess. Patient is status post hernia repair in this site. Musculoskeletal: No acute or significant osseous findings. IMPRESSION: Status post surgical repair of ventral hernia. 5.8 x 5.1 cm fluid collection is seen  within this area consistent with seroma or possibly abscess. Clinical correlation is recommended. Fatty infiltration of the liver. 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 Stable 1.6 cm complex cyst is seen in midpole cortex of right kidney. As recommended on prior exam, further evaluation with MRI with and without gadolinium is recommended. Electronically Signed   By: Lupita Raider, M.D.   On: 08/23/2016 12:27    Labs:  CBC:  Recent Labs  05/14/16 1330 05/15/16 0359 06/26/16 0836  WBC 11.0* 10.2 7.1  HGB 12.7 11.7* 10.9*  HCT 40.3 37.2 35.2*  PLT 407* 349 386    COAGS: No results for input(s): INR, APTT in the last 8760 hours.  BMP:  Recent Labs  05/14/16 1330 05/15/16 0359 05/15/16 0926 06/26/16 0836  NA 139 138 140 139  K 4.9 4.3 4.1 4.0  CL 101 98* 101 105  CO2 25 27 27  24   GLUCOSE 189* 221* 217* 144*  BUN 12 13 15 14   CALCIUM 10.3 9.3 9.3 9.5  CREATININE 0.80 0.81 0.75 0.71  GFRNONAA >60 >60 >60 >60  GFRAA >60 >60 >60 >60    LIVER FUNCTION TESTS:  Recent Labs  05/14/16 1330 05/15/16 0359  BILITOT 0.5 0.6  AST 30 24  ALT 33 29  ALKPHOS 41 42  PROT 8.6* 8.0  ALBUMIN 4.3 3.8    TUMOR MARKERS: No results for input(s): AFPTM, CEA, CA199, CHROMGRNA in the last 8760 hours.  Assessment and Plan: 56 y.o. female with history of ventral hernia repair with small bowel resection approximately 3 years ago and subsequent recurrence which required repeat repair with mesh placement on 07/01/16. She now presents with persistent abdominal pain, low-grade fever and imaging which reveals 5.8 x 5.1 cm fluid collection at surgical site consistent with seroma or possibly abscess. She presents today for image guided aspiration/possible drainage of the collection. Details/risks of procedure, including but not limited to, internal bleeding, infection, injury to adjacent structures, possible need for drain placement discussed with patient with her understanding and consent. LABS PENDING.    Thank you for this interesting consult.  I greatly enjoyed meeting Lauren Schmidt and look forward to participating in their care.  A copy of this report was sent to the requesting provider on this date.  Electronically Signed: D. Jeananne Rama, PA-C 08/29/2016, 11:50 AM   I spent a total of  25 minutes   in face to face in clinical consultation, greater than 50% of which was counseling/coordinating care for image guided aspiration/possible drainage of abdominal wall fluid collection

## 2016-08-29 NOTE — Procedures (Signed)
Complex fluid collection identified below the ventral abdominal incision.   19 gauge Yueh placed in collection with US and thick yellow fluid aspirated.  Due the appearance of the fluid, decided to leave a 10 Fr drain.  7 ml of the fluid was removed.   Minimal blood loss.  No immediate complication.

## 2016-08-29 NOTE — Discharge Instructions (Signed)
JP Drain [employer] this sheet to all of your post-operative appointments while you have your drains.  Please measure your drains by CC's or ML's.  Make sure you drain and measure your JP Drains 3 times per day.  At the end of each day, add up totals for the left side and add up totals for the right side.    ( 9 am )     ( 3 pm )        ( 9 pm )                Date L  R  L  R  L  R  Total L/R                                                                                                                                                                                      Moderate Conscious Sedation, Adult, Care After These instructions provide you with information about caring for yourself after your procedure. Your health care provider may also give you more specific instructions. Your treatment has been planned according to current medical practices, but problems sometimes occur. Call your health care provider if you have any problems or questions after your procedure. What can I expect after the procedure? After your procedure, it is common:  To feel sleepy for several hours.  To feel clumsy and have poor balance for several hours.  To have poor judgment for several hours.  To vomit if you eat too soon. Follow these instructions at home: For at least 24 hours after the procedure:    Do not:  Participate in activities where you could fall or become injured.  Drive.  Use heavy machinery.  Drink alcohol.  Take sleeping pills or medicines that cause drowsiness.  Make important decisions or sign legal documents.  Take care of children on your own.  Rest. Eating and drinking   Follow the diet recommended by your health care provider.  If you vomit:  Drink water, juice, or soup when you can drink without vomiting.  Make sure you have little or no nausea before eating solid foods. General instructions   Have a responsible adult stay with you until  you are awake and alert.  Take over-the-counter and prescription medicines only as told by your health care provider.  If you smoke, do not smoke without supervision.  Keep all follow-up visits as told by your health care provider. This is important. Contact a health care provider if:  You keep feeling nauseous or you keep vomiting.  You feel light-headed.  You develop a rash.  You have a fever. Get  help right away if:  You have trouble breathing. This information is not intended to replace advice given to you by your health care provider. Make sure you discuss any questions you have with your health care provider. Document Released: 01/13/2013 Document Revised: 08/28/2015 Document Reviewed: 07/15/2015 Elsevier Interactive Patient Education  2017 [employer]

## 2016-09-05 LAB — AEROBIC/ANAEROBIC CULTURE W GRAM STAIN (SURGICAL/DEEP WOUND)

## 2016-09-05 LAB — AEROBIC/ANAEROBIC CULTURE (SURGICAL/DEEP WOUND): CULTURE: NO GROWTH

## 2016-09-09 NOTE — Addendum Note (Signed)
Addendum  created 09/09/16 1246 by Gwyn Hieronymus, MD   Sign clinical note    

## 2016-10-03 ENCOUNTER — Other Ambulatory Visit: Payer: Self-pay | Admitting: Orthopedic Surgery

## 2016-10-16 ENCOUNTER — Encounter (HOSPITAL_BASED_OUTPATIENT_CLINIC_OR_DEPARTMENT_OTHER): Payer: Self-pay | Admitting: *Deleted

## 2016-10-16 NOTE — Progress Notes (Signed)
Pt coming Friday after 2 pm for Anesthesia Consult ( BMI 50) and BMET  Bring all medications and CPAP machine day of surgery.

## 2016-10-18 ENCOUNTER — Encounter (HOSPITAL_BASED_OUTPATIENT_CLINIC_OR_DEPARTMENT_OTHER)
Admission: RE | Admit: 2016-10-18 | Discharge: 2016-10-18 | Disposition: A | Discharge status: Home / Self Care | Payer: Managed Care, Other (non HMO) | Source: Ambulatory Visit | Attending: Orthopedic Surgery | Admitting: Orthopedic Surgery

## 2016-10-18 DIAGNOSIS — Z01818 Encounter for other preprocedural examination: Secondary | ICD-10-CM | POA: Insufficient documentation

## 2016-10-18 LAB — BASIC METABOLIC PANEL
Anion gap: 10 (ref 5–15)
BUN: 17 mg/dL (ref 6–20)
CALCIUM: 9.3 mg/dL (ref 8.9–10.3)
CHLORIDE: 103 mmol/L (ref 101–111)
CO2: 26 mmol/L (ref 22–32)
Creatinine, Ser: 0.76 mg/dL (ref 0.44–1.00)
GFR calc Af Amer: >60 mL/min (ref >60)
GFR calc non Af Amer: >60 mL/min (ref >60)
GLUCOSE: 125 mg/dL — AB (ref 65–99)
Potassium: 3.9 mmol/L (ref 3.5–5.1)
Sodium: 139 mmol/L (ref 135–145)

## 2016-10-18 NOTE — Progress Notes (Signed)
Anesthesia consult completed by Dr Sherron AlesK.Jackson, will proceed with surgery as scheduled.

## 2016-10-24 ENCOUNTER — Encounter (HOSPITAL_BASED_OUTPATIENT_CLINIC_OR_DEPARTMENT_OTHER): Payer: Self-pay | Admitting: Anesthesiology

## 2016-10-24 ENCOUNTER — Encounter (HOSPITAL_BASED_OUTPATIENT_CLINIC_OR_DEPARTMENT_OTHER)
Admission: RE | Disposition: A | Discharge status: Home / Self Care | Payer: Self-pay | Source: Ambulatory Visit | Attending: Orthopedic Surgery

## 2016-10-24 ENCOUNTER — Ambulatory Visit (HOSPITAL_BASED_OUTPATIENT_CLINIC_OR_DEPARTMENT_OTHER)
Admission: RE | Admit: 2016-10-24 | Discharge: 2016-10-24 | Disposition: A | Discharge status: Home / Self Care | Payer: Managed Care, Other (non HMO) | Source: Ambulatory Visit | Attending: Orthopedic Surgery | Admitting: Orthopedic Surgery

## 2016-10-24 ENCOUNTER — Ambulatory Visit (HOSPITAL_BASED_OUTPATIENT_CLINIC_OR_DEPARTMENT_OTHER): Payer: Managed Care, Other (non HMO) | Admitting: Anesthesiology

## 2016-10-24 DIAGNOSIS — Z881 Allergy status to other antibiotic agents status: Secondary | ICD-10-CM | POA: Diagnosis not present

## 2016-10-24 DIAGNOSIS — Z9989 Dependence on other enabling machines and devices: Secondary | ICD-10-CM | POA: Insufficient documentation

## 2016-10-24 DIAGNOSIS — K449 Diaphragmatic hernia without obstruction or gangrene: Secondary | ICD-10-CM | POA: Insufficient documentation

## 2016-10-24 DIAGNOSIS — Z7982 Long term (current) use of aspirin: Secondary | ICD-10-CM | POA: Insufficient documentation

## 2016-10-24 DIAGNOSIS — Z91013 Allergy to seafood: Secondary | ICD-10-CM | POA: Insufficient documentation

## 2016-10-24 DIAGNOSIS — I10 Essential (primary) hypertension: Secondary | ICD-10-CM | POA: Diagnosis not present

## 2016-10-24 DIAGNOSIS — E1142 Type 2 diabetes mellitus with diabetic polyneuropathy: Secondary | ICD-10-CM | POA: Diagnosis not present

## 2016-10-24 DIAGNOSIS — Z7984 Long term (current) use of oral hypoglycemic drugs: Secondary | ICD-10-CM | POA: Diagnosis not present

## 2016-10-24 DIAGNOSIS — K219 Gastro-esophageal reflux disease without esophagitis: Secondary | ICD-10-CM | POA: Insufficient documentation

## 2016-10-24 DIAGNOSIS — F419 Anxiety disorder, unspecified: Secondary | ICD-10-CM | POA: Diagnosis not present

## 2016-10-24 DIAGNOSIS — F329 Major depressive disorder, single episode, unspecified: Secondary | ICD-10-CM | POA: Insufficient documentation

## 2016-10-24 DIAGNOSIS — Z87891 Personal history of nicotine dependence: Secondary | ICD-10-CM | POA: Insufficient documentation

## 2016-10-24 DIAGNOSIS — G4730 Sleep apnea, unspecified: Secondary | ICD-10-CM | POA: Insufficient documentation

## 2016-10-24 DIAGNOSIS — Z91048 Other nonmedicinal substance allergy status: Secondary | ICD-10-CM | POA: Diagnosis not present

## 2016-10-24 DIAGNOSIS — Z6841 Body Mass Index (BMI) 40.0 and over, adult: Secondary | ICD-10-CM | POA: Insufficient documentation

## 2016-10-24 DIAGNOSIS — M65311 Trigger thumb, right thumb: Secondary | ICD-10-CM | POA: Diagnosis not present

## 2016-10-24 DIAGNOSIS — E7800 Pure hypercholesterolemia, unspecified: Secondary | ICD-10-CM | POA: Insufficient documentation

## 2016-10-24 DIAGNOSIS — K760 Fatty (change of) liver, not elsewhere classified: Secondary | ICD-10-CM | POA: Diagnosis not present

## 2016-10-24 DIAGNOSIS — Z79899 Other long term (current) drug therapy: Secondary | ICD-10-CM | POA: Insufficient documentation

## 2016-10-24 HISTORY — PX: TRIGGER FINGER RELEASE: SHX641

## 2016-10-24 LAB — GLUCOSE, CAPILLARY
GLUCOSE-CAPILLARY: 105 mg/dL — AB (ref 65–99)
Glucose-Capillary: 84 mg/dL (ref 65–99)

## 2016-10-24 SURGERY — RELEASE, A1 PULLEY, FOR TRIGGER FINGER
Anesthesia: General | Site: Finger | Laterality: Right

## 2016-10-24 MED ORDER — HYDROCODONE-ACETAMINOPHEN 5-325 MG PO TABS: ORAL_TABLET | ORAL | 0 refills | Status: DC

## 2016-10-24 MED ORDER — ONDANSETRON HCL 4 MG/2ML IJ SOLN: 4.0000 mg | Freq: Once | INTRAMUSCULAR | Status: DC | PRN

## 2016-10-24 MED ORDER — FENTANYL CITRATE (PF) 100 MCG/2ML IJ SOLN: 50.0000 ug | INTRAMUSCULAR | Status: DC | PRN

## 2016-10-24 MED ORDER — CHLORHEXIDINE GLUCONATE 4 % EX LIQD: 60.0000 mL | Freq: Once | CUTANEOUS | Status: DC

## 2016-10-24 MED ORDER — MIDAZOLAM HCL 2 MG/2ML IJ SOLN: 1.0000 mg | INTRAMUSCULAR | Status: DC | PRN

## 2016-10-24 MED ORDER — BUPIVACAINE HCL (PF) 0.25 % IJ SOLN
INTRAMUSCULAR | Status: DC | PRN
  Administered 2016-10-24: 5 mL

## 2016-10-24 MED ORDER — LACTATED RINGERS IV SOLN
INTRAVENOUS | Status: DC
  Administered 2016-10-24: 11:00:00 via INTRAVENOUS

## 2016-10-24 MED ORDER — DEXAMETHASONE SODIUM PHOSPHATE 10 MG/ML IJ SOLN
INTRAMUSCULAR | Status: AC
  Filled 2016-10-24: qty 1

## 2016-10-24 MED ORDER — PROPOFOL 10 MG/ML IV BOLUS
INTRAVENOUS | Status: DC | PRN
  Administered 2016-10-24: 200 mg via INTRAVENOUS

## 2016-10-24 MED ORDER — SCOPOLAMINE 1 MG/3DAYS TD PT72: 1.0000 | MEDICATED_PATCH | Freq: Once | TRANSDERMAL | Status: DC | PRN

## 2016-10-24 MED ORDER — LIDOCAINE HCL (CARDIAC) 20 MG/ML IV SOLN
INTRAVENOUS | Status: AC
  Filled 2016-10-24: qty 5

## 2016-10-24 MED ORDER — FENTANYL CITRATE (PF) 100 MCG/2ML IJ SOLN
INTRAMUSCULAR | Status: AC
  Filled 2016-10-24: qty 2

## 2016-10-24 MED ORDER — CEFAZOLIN SODIUM-DEXTROSE 2-4 GM/100ML-% IV SOLN
2.0000 g | INTRAVENOUS | Status: AC
  Administered 2016-10-24: 2 g via INTRAVENOUS

## 2016-10-24 MED ORDER — MIDAZOLAM HCL 2 MG/2ML IJ SOLN
INTRAMUSCULAR | Status: AC
  Filled 2016-10-24: qty 2

## 2016-10-24 MED ORDER — MIDAZOLAM HCL 5 MG/5ML IJ SOLN
INTRAMUSCULAR | Status: DC | PRN
  Administered 2016-10-24: 2 mg via INTRAVENOUS

## 2016-10-24 MED ORDER — DEXAMETHASONE SODIUM PHOSPHATE 10 MG/ML IJ SOLN
INTRAMUSCULAR | Status: DC | PRN
  Administered 2016-10-24: 10 mg via INTRAVENOUS

## 2016-10-24 MED ORDER — ONDANSETRON HCL 4 MG/2ML IJ SOLN
INTRAMUSCULAR | Status: AC
  Filled 2016-10-24: qty 2

## 2016-10-24 MED ORDER — FENTANYL CITRATE (PF) 100 MCG/2ML IJ SOLN
INTRAMUSCULAR | Status: DC | PRN
  Administered 2016-10-24: 50 ug via INTRAVENOUS

## 2016-10-24 MED ORDER — CEFAZOLIN SODIUM-DEXTROSE 2-4 GM/100ML-% IV SOLN
INTRAVENOUS | Status: AC
  Filled 2016-10-24: qty 100

## 2016-10-24 MED ORDER — PROPOFOL 10 MG/ML IV BOLUS
INTRAVENOUS | Status: AC
  Filled 2016-10-24: qty 20

## 2016-10-24 MED ORDER — FENTANYL CITRATE (PF) 100 MCG/2ML IJ SOLN: 25.0000 ug | INTRAMUSCULAR | Status: DC | PRN

## 2016-10-24 SURGICAL SUPPLY — 29 items
BANDAGE COBAN STERILE 2 (GAUZE/BANDAGES/DRESSINGS) ×3 IMPLANT
BLADE SURG 15 STRL LF DISP TIS (BLADE) ×2 IMPLANT
BLADE SURG 15 STRL SS (BLADE) ×4
BNDG CONFORM 2 STRL LF (GAUZE/BANDAGES/DRESSINGS) ×3 IMPLANT
BNDG ESMARK 4X9 LF (GAUZE/BANDAGES/DRESSINGS) ×3 IMPLANT
CHLORAPREP W/TINT 26ML (MISCELLANEOUS) ×3 IMPLANT
CORD BIPOLAR FORCEPS 12FT (ELECTRODE) ×3 IMPLANT
COVER BACK TABLE 60X90IN (DRAPES) ×3 IMPLANT
COVER MAYO STAND STRL (DRAPES) ×3 IMPLANT
CUFF TOURNIQUET SINGLE 18IN (TOURNIQUET CUFF) ×3 IMPLANT
DRAPE EXTREMITY T 121X128X90 (DRAPE) ×3 IMPLANT
DRAPE SURG 17X23 STRL (DRAPES) ×3 IMPLANT
GAUZE SPONGE 4X4 12PLY STRL (GAUZE/BANDAGES/DRESSINGS) ×3 IMPLANT
GAUZE XEROFORM 1X8 LF (GAUZE/BANDAGES/DRESSINGS) ×3 IMPLANT
GLOVE BIO SURGEON STRL SZ7.5 (GLOVE) ×3 IMPLANT
GLOVE BIOGEL PI IND STRL 8 (GLOVE) ×1 IMPLANT
GLOVE BIOGEL PI INDICATOR 8 (GLOVE) ×2
GOWN STRL REUS W/ TWL LRG LVL3 (GOWN DISPOSABLE) ×1 IMPLANT
GOWN STRL REUS W/TWL LRG LVL3 (GOWN DISPOSABLE) ×2
GOWN STRL REUS W/TWL XL LVL3 (GOWN DISPOSABLE) ×3 IMPLANT
NEEDLE HYPO 25X1 1.5 SAFETY (NEEDLE) ×3 IMPLANT
NS IRRIG 1000ML POUR BTL (IV SOLUTION) ×3 IMPLANT
PACK BASIN DAY SURGERY FS (CUSTOM PROCEDURE TRAY) ×3 IMPLANT
STOCKINETTE 4X48 STRL (DRAPES) ×3 IMPLANT
SUT ETHILON 4 0 PS 2 18 (SUTURE) ×3 IMPLANT
SYR BULB 3OZ (MISCELLANEOUS) ×3 IMPLANT
SYR CONTROL 10ML LL (SYRINGE) ×3 IMPLANT
TOWEL OR 17X24 6PK STRL BLUE (TOWEL DISPOSABLE) ×6 IMPLANT
UNDERPAD 30X30 (UNDERPADS AND DIAPERS) ×3 IMPLANT

## 2016-10-24 NOTE — Discharge Instructions (Addendum)

## 2016-10-24 NOTE — H&P (Signed)
Lauren LofflerLinda Schmidt is an 56 y.o. female.   Chief Complaint: right thumb trigger digit HPI: 56 yo female with triggering of right thumb.  This has been injected without lasting resolution.  She wishes to have a trigger release.  Allergies:  Allergies  Allergen Reactions  . Macrobid [Nitrofurantoin Macrocrystal] Shortness Of Breath, Itching and Swelling  . Adhesive [Tape]     Leave red marks   . Shellfish Allergy Rash    Rash     Past Medical History:  Diagnosis Date  . Anemia   . Anxiety   . Depression   . Diabetes mellitus without complication (HCC)    type 2  . Family history of adverse reaction to anesthesia    sister's bp dropped low during surgery  . Fatty liver   . GERD (gastroesophageal reflux disease)   . History of hiatal hernia   . Hypercholesterolemia   . Hypertension   . Peripheral neuropathy    feet  . Sleep apnea    uses cpap  . Trigger thumb of both thumbs   . Umbilical hernia with gangrene and obstruction 01/31/2013   Repaired on 01/31/13. SB resection done     Past Surgical History:  Procedure Laterality Date  . BOWEL RESECTION N/A 01/31/2013   Procedure: SMALL BOWEL RESECTION;  Surgeon: Robyne AskewPaul S Toth III, MD;  Location: [facility] OR;  Service: General;  Laterality: N/A;  . COLON SURGERY    . COLONOSCOPY    . HERNIA REPAIR    . INSERTION OF MESH N/A 07/01/2016   Procedure: INSERTION OF MESH;  Surgeon: Chevis PrettyPaul Toth III, MD;  Location: [facility] OR;  Service: General;  Laterality: N/A;  . KNEE ARTHROSCOPY Right 1990s?  Marland Kitchen. LAPAROSCOPIC ASSISTED VENTRAL HERNIA REPAIR  07/01/2016  . Right Knee arthroscopy    . UMBILICAL HERNIA REPAIR N/A 01/31/2013   Procedure: HERNIA REPAIR UMBILICAL ADULT;  Surgeon: Robyne AskewPaul S Toth III, MD;  [facility];  Service: General;  Laterality: N/A;  . VENTRAL HERNIA REPAIR N/A 07/01/2016   Procedure: LAPROSCOPIC ASSISTED VENTRAL HERNIA REPAIR;  Surgeon: Chevis PrettyPaul Toth III, MD;  Location: [facility] OR;  Service: General;  Laterality: N/A;    Family  History: Family History  Problem Relation Age of Onset  . Heart failure Mother   . Aneurysm Mother   . Diabetes Mother     Social History:   reports that she has quit smoking. She has never used smokeless tobacco. She reports that she does not drink alcohol or use drugs.  Medications: Medications Prior to Admission  Medication Sig Dispense Refill  . aspirin EC 81 MG tablet Take 81 mg by mouth every evening.    . dapagliflozin propanediol (FARXIGA) 5 MG TABS tablet Take 5 mg by mouth daily.    . fenofibrate (TRICOR) 145 MG tablet Take 145 mg by mouth daily.    Marland Kitchen. glimepiride (AMARYL) 4 MG tablet Take 4 mg by mouth daily with breakfast.    . lisinopril (PRINIVIL,ZESTRIL) 20 MG tablet Take 20 mg by mouth daily.    . metFORMIN (GLUCOPHAGE) 500 MG tablet Take 1,000 mg by mouth 2 (two) times daily with a meal. Takes 2 tablets twice a day.     . metoprolol succinate (TOPROL-XL) 50 MG 24 hr tablet Take 50 mg by mouth daily. Take with or immediately following a meal.    . Multiple Vitamin (MULTIVITAMIN) LIQD Take 5 mLs by mouth daily.    Marland Kitchen. omega-3 acid ethyl esters (LOVAZA) 1 g capsule Take 2  g by mouth 2 (two) times daily.     Marland Kitchen omeprazole (PRILOSEC) 20 MG capsule Take 20 mg by mouth 2 (two) times daily before a meal.     . sertraline (ZOLOFT) 100 MG tablet Take 200 mg by mouth daily.     . simvastatin (ZOCOR) 40 MG tablet Take 40 mg by mouth every evening.    Marland Kitchen ALPRAZolam (XANAX) 0.5 MG tablet Take 0.5 mg by mouth at bedtime as needed for sleep or anxiety.      Results for orders placed or performed during the hospital encounter of 10/24/16 (from the past 48 hour(s))  Glucose, capillary     Status: Abnormal   Collection Time: 10/24/16 10:42 AM  Result Value Ref Range   Glucose-Capillary 105 (H) 65 - 99 mg/dL    No results found.   A comprehensive review of systems was negative.  Blood pressure 139/61, pulse 67, temperature 97.8 F (36.6 C), temperature source Oral, resp. rate 20,  height 4\' 11"  (1.499 m), weight 113.4 kg (250 lb), SpO2 100 %.  General appearance: alert, cooperative and appears stated age Head: Normocephalic, without obvious abnormality, atraumatic Neck: supple, symmetrical, trachea midline Resp: clear to auscultation bilaterally Cardio: regular rate and rhythm GI: non-tender Extremities: Intact sensation and capillary refill all digits.  +epl/fpl/io.  No wounds.  Pulses: 2+ and symmetric Skin: Skin color, texture, turgor normal. No rashes or lesions Neurologic: Grossly normal Incision/Wound:none  Assessment/Plan Right trigger thumb.  Non operative and operative treatment options were discussed with the patient and patient wishes to proceed with operative treatment. Risks, benefits, and alternatives of surgery were discussed and the patient agrees with the plan of care.   Coraima Tibbs R 10/24/2016, 11:13 AM

## 2016-10-24 NOTE — Anesthesia Preprocedure Evaluation (Addendum)
Anesthesia Evaluation  Patient identified by MRN, date of birth, ID band Patient awake    Reviewed: Allergy & Precautions, NPO status , Patient's Chart, lab work & pertinent test results, reviewed documented beta blocker date and time   History of Anesthesia Complications Negative for: history of anesthetic complications  Airway Mallampati: II  TM Distance: <3 FB Neck ROM: Full    Dental  (+) Teeth Intact, Dental Advisory Given   Pulmonary sleep apnea and Continuous Positive Airway Pressure Ventilation , former smoker,    Pulmonary exam normal breath sounds clear to auscultation       Cardiovascular hypertension, Pt. on home beta blockers and Pt. on medications (-) CAD, (-) Past MI and (-) CHF Normal cardiovascular exam Rhythm:Regular Rate:Normal     Neuro/Psych PSYCHIATRIC DISORDERS Anxiety Depression  Neuromuscular disease    GI/Hepatic Neg liver ROS, hiatal hernia, GERD  Medicated and Controlled,  Endo/Other  diabetes, Type 2, Oral Hypoglycemic AgentsMorbid obesity  Renal/GU negative Renal ROS     Musculoskeletal negative musculoskeletal ROS (+)   Abdominal   Peds  Hematology negative hematology ROS (+)   Anesthesia Other Findings Day of surgery medications reviewed with the patient.  Reproductive/Obstetrics                            Anesthesia Physical Anesthesia Plan  ASA: III  Anesthesia Plan: General   Post-op Pain Management:    Induction: Intravenous  PONV Risk Score and Plan: 3 and Ondansetron, Dexamethasone and Midazolam  Airway Management Planned: LMA  Additional Equipment:   Intra-op Plan:   Post-operative Plan: Extubation in OR  Informed Consent: I have reviewed the patients History and Physical, chart, labs and discussed the procedure including the risks, benefits and alternatives for the proposed anesthesia with the patient or authorized representative who has  indicated his/her understanding and acceptance.   Dental advisory given  Plan Discussed with:   Anesthesia Plan Comments: (Unable to obtain IV for Bier block in operative hand.  Patient wished to proceed with general anesthesia.)       Anesthesia Quick Evaluation

## 2016-10-24 NOTE — Op Note (Signed)
10/24/2016 [facility] OPERATIVE REPORT   PREOPERATIVE DIAGNOSIS: Right trigger thumb.  POSTOPERATIVE DIAGNOSIS:  Right trigger thumb.  PROCEDURE: Right trigger thumb release.  SURGEON:  Betha LoaKevin Jamecia Lerman, MD  ASSISTANT:  None.  ANESTHESIA:  General.  IV FLUIDS:  Per anesthesia flow sheet.  ESTIMATED BLOOD LOSS:  Minimal.  COMPLICATIONS:  None.  SPECIMENS:  None.  TOURNIQUET TIME:  Total Tourniquet Time Documented: Forearm (Right) - 10 minutes Total: Forearm (Right) - 10 minutes   DISPOSITION:  Stable to PACU.  LOCATION:  SURGERY CENTER  INDICATIONS: Lauren Schmidt is a 56 y.o. female with triggering of right thumb.  Recurrent after injection.  Risks, benefits and alternatives of surgery were discussed including the risk of blood loss, infection, damage to nerves, vessels, tendons, ligaments, bone, failure of surgery, need for additional surgery, complications with wound healing, continued pain, continued triggering and need for repeat surgery.  She voiced understanding of these risks and elected to proceed.  OPERATIVE COURSE:  After being identified preoperatively by myself, the patient and I agreed upon the procedure and site of procedure.  The surgical site was marked. The risks, benefits, and alternatives of surgery were reviewed and she wished to proceed.  Surgical consent had been signed. She was given IV Ancef as preoperative antibiotic prophylaxis. She was transported to the operating room and placed on the operating room table in supine position with the Right upper extremity on an arm board. General was induced by the anesthesiologist.  The Right upper extremity was prepped and draped in normal sterile orthopedic fashion. A surgical pause was performed between surgeons, anesthesia, and operating room staff, and all were in agreement as to the patient, procedure, and site of procedure.  Tourniquet at the proximal aspect of the forearm was inflated after  exsanguination of the limb with an Esmarch bandage.  An incision was made transversely at the MP flexion crease of the thumb.  This was made through the skin only.  Spreading technique was used.  The radial and ulnar digital nerves were identified and were protected throughout the case. The flexor sheath was identified.  The A1 pulley was identified.  It was sharply incised.  It was released in its entirety.  Care was taken to avoid any release of the oblique pulley. The thumb was placed through a range of motion, there was noted to be no catching.  The wound was copiously irrigated with sterile saline. It was then closed with 4-0 nylon in a horizontal mattress fashion.  The wound was injected with  0.25% plain Marcaine to aid in postoperative analgesia.  It was then dressed with sterile Xeroform, 4x4s, and wrapped lightly with a Coban dressing.  Tourniquet was deflated at 10 minutes.  The fingertips were pink with brisk capillary refill after deflation of the tourniquet.  The operative drapes were broken down and the patient was awoken from anesthesia safely.  She was transferred back to the stretcher and taken to the PACU in stable condition.   I will see her back in the office in 1 week for postoperative followup.  I will give her a prescription for norco 5/325 1-2 tabs po q6 hours prn pain, dispense #20.    Tami RibasKUZMA,Lauren Fluhr R, MD Electronically signed, 10/24/16

## 2016-10-24 NOTE — Anesthesia Postprocedure Evaluation (Signed)
Anesthesia Post Note  Patient: Lauren Schmidt  Procedure(s) Performed: Procedure(s) (LRB): RELEASE TRIGGER FINGER/A-1 PULLEY RIGHT THUMB TRIGGER RELEASE (Right)     Patient location during evaluation: PACU Anesthesia Type: General Level of consciousness: awake and alert Pain management: pain level controlled Vital Signs Assessment: post-procedure vital signs reviewed and stable Respiratory status: spontaneous breathing, nonlabored ventilation, respiratory function stable and patient connected to nasal cannula oxygen Cardiovascular status: blood pressure returned to baseline and stable Postop Assessment: no signs of nausea or vomiting Anesthetic complications: no    Last Vitals:  Vitals:   10/24/16 1345 10/24/16 1409  BP: (!) 110/57 121/64  Pulse: 74 77  Resp: 15 16  Temp:  36.7 C    Last Pain:  Vitals:   10/24/16 1409  TempSrc: Oral  PainSc: 0-No pain                 Cecile HearingStephen Edward Turk

## 2016-10-24 NOTE — Anesthesia Procedure Notes (Signed)
Procedure Name: LMA Insertion Date/Time: 10/24/2016 12:40 PM Performed by: Genevieve NorlanderLINKA, Lauren Schmidt Pre-anesthesia Checklist: Emergency Drugs available, Suction available, Patient being monitored, Timeout performed and Patient identified Patient Re-evaluated:Patient Re-evaluated prior to induction Oxygen Delivery Method: Circle system utilized Preoxygenation: Pre-oxygenation with 100% oxygen Induction Type: IV induction Ventilation: Mask ventilation without difficulty LMA: LMA inserted LMA Size: 4.0 Number of attempts: 1 Airway Equipment and Method: Bite block Placement Confirmation: positive ETCO2 Tube secured with: Tape Dental Injury: Teeth and Oropharynx as per pre-operative assessment

## 2016-10-24 NOTE — Brief Op Note (Signed)
10/24/2016  12:57 PM  PATIENT:  Susa LofflerLinda Lounsbury  56 y.o. female  PRE-OPERATIVE DIAGNOSIS:  RIGHT THUMB TRIGGER DIGIT  POST-OPERATIVE DIAGNOSIS:  RIGHT THUMB TRIGGER DIGIT  PROCEDURE:  Procedure(s): RELEASE TRIGGER FINGER/A-1 PULLEY RIGHT THUMB TRIGGER RELEASE (Right)  SURGEON:  Surgeon(s) and Role:    * Betha LoaKuzma, Fatimah Sundquist, MD - Primary  PHYSICIAN ASSISTANT:   ASSISTANTS: none   ANESTHESIA:   general  EBL:  Total I/O In: 500 [I.V.:500] Out: 1 [Blood:1]  BLOOD ADMINISTERED:none  DRAINS: none   LOCAL MEDICATIONS USED:  MARCAINE     SPECIMEN:  No Specimen  DISPOSITION OF SPECIMEN:  N/A  COUNTS:  YES  TOURNIQUET:   Total Tourniquet Time Documented: Forearm (Right) - 10 minutes Total: Forearm (Right) - 10 minutes   DICTATION: .Note written in EPIC  PLAN OF CARE: Discharge to home after PACU  PATIENT DISPOSITION:  PACU - hemodynamically stable.

## 2016-10-24 NOTE — Transfer of Care (Signed)
Immediate Anesthesia Transfer of Care Note  Patient: Lauren LofflerLinda Schmidt  Procedure(s) Performed: Procedure(s): RELEASE TRIGGER FINGER/A-1 PULLEY RIGHT THUMB TRIGGER RELEASE (Right)  Patient Location: PACU  Anesthesia Type:General  Level of Consciousness: awake and patient cooperative  Airway & Oxygen Therapy: Patient Spontanous Breathing and Patient connected to face mask oxygen  Post-op Assessment: Report given to RN and Post -op Vital signs reviewed and stable  Post vital signs: Reviewed and stable  Last Vitals:  Vitals:   10/24/16 1023  BP: 139/61  Pulse: 67  Resp: 20  Temp: 36.6 C    Last Pain:  Vitals:   10/24/16 1023  TempSrc: Oral  PainSc: 3          Complications: No apparent anesthesia complications

## 2016-10-25 ENCOUNTER — Encounter (HOSPITAL_BASED_OUTPATIENT_CLINIC_OR_DEPARTMENT_OTHER): Payer: Self-pay | Admitting: Orthopedic Surgery

## 2017-08-25 ENCOUNTER — Other Ambulatory Visit: Payer: Self-pay

## 2018-03-30 ENCOUNTER — Other Ambulatory Visit (HOSPITAL_COMMUNITY): Payer: Self-pay | Admitting: *Deleted

## 2018-03-31 ENCOUNTER — Ambulatory Visit (HOSPITAL_COMMUNITY)
Admission: RE | Admit: 2018-03-31 | Discharge: 2018-03-31 | Disposition: A | Discharge status: Home / Self Care | Payer: Managed Care, Other (non HMO) | Source: Ambulatory Visit | Attending: Obstetrics and Gynecology | Admitting: Obstetrics and Gynecology

## 2018-03-31 DIAGNOSIS — D509 Iron deficiency anemia, unspecified: Secondary | ICD-10-CM | POA: Insufficient documentation

## 2018-03-31 MED ORDER — FERUMOXYTOL INJECTION 510 MG/17 ML
510.0000 mg | INTRAVENOUS | Status: DC
  Administered 2018-03-31: 09:00:00 510 mg via INTRAVENOUS
  Filled 2018-03-31: qty 510

## 2018-04-06 ENCOUNTER — Encounter (HOSPITAL_COMMUNITY): Payer: Managed Care, Other (non HMO)

## 2018-04-09 ENCOUNTER — Encounter (HOSPITAL_COMMUNITY): Payer: Managed Care, Other (non HMO)

## 2018-04-22 DIAGNOSIS — I10 Essential (primary) hypertension: Secondary | ICD-10-CM | POA: Diagnosis not present

## 2018-04-22 DIAGNOSIS — K219 Gastro-esophageal reflux disease without esophagitis: Secondary | ICD-10-CM | POA: Diagnosis not present

## 2018-04-22 DIAGNOSIS — E782 Mixed hyperlipidemia: Secondary | ICD-10-CM | POA: Diagnosis not present

## 2018-04-22 DIAGNOSIS — E1140 Type 2 diabetes mellitus with diabetic neuropathy, unspecified: Secondary | ICD-10-CM | POA: Diagnosis not present

## 2018-09-17 DIAGNOSIS — G4733 Obstructive sleep apnea (adult) (pediatric): Secondary | ICD-10-CM | POA: Diagnosis not present

## 2018-10-15 DIAGNOSIS — E1140 Type 2 diabetes mellitus with diabetic neuropathy, unspecified: Secondary | ICD-10-CM | POA: Diagnosis not present

## 2018-10-15 DIAGNOSIS — E782 Mixed hyperlipidemia: Secondary | ICD-10-CM | POA: Diagnosis not present

## 2018-10-21 DIAGNOSIS — I10 Essential (primary) hypertension: Secondary | ICD-10-CM | POA: Diagnosis not present

## 2018-10-21 DIAGNOSIS — E782 Mixed hyperlipidemia: Secondary | ICD-10-CM | POA: Diagnosis not present

## 2018-10-21 DIAGNOSIS — K219 Gastro-esophageal reflux disease without esophagitis: Secondary | ICD-10-CM | POA: Diagnosis not present

## 2018-10-21 DIAGNOSIS — E1140 Type 2 diabetes mellitus with diabetic neuropathy, unspecified: Secondary | ICD-10-CM | POA: Diagnosis not present

## 2019-04-23 DIAGNOSIS — K219 Gastro-esophageal reflux disease without esophagitis: Secondary | ICD-10-CM | POA: Diagnosis not present

## 2019-04-23 DIAGNOSIS — E1140 Type 2 diabetes mellitus with diabetic neuropathy, unspecified: Secondary | ICD-10-CM | POA: Diagnosis not present

## 2019-04-23 DIAGNOSIS — E782 Mixed hyperlipidemia: Secondary | ICD-10-CM | POA: Diagnosis not present

## 2019-04-23 DIAGNOSIS — I10 Essential (primary) hypertension: Secondary | ICD-10-CM | POA: Diagnosis not present

## 2019-06-25 DIAGNOSIS — E11319 Type 2 diabetes mellitus with unspecified diabetic retinopathy without macular edema: Secondary | ICD-10-CM | POA: Diagnosis not present

## 2019-06-25 DIAGNOSIS — H35363 Drusen (degenerative) of macula, bilateral: Secondary | ICD-10-CM | POA: Diagnosis not present

## 2019-10-21 DIAGNOSIS — I10 Essential (primary) hypertension: Secondary | ICD-10-CM | POA: Diagnosis not present

## 2019-10-21 DIAGNOSIS — E1140 Type 2 diabetes mellitus with diabetic neuropathy, unspecified: Secondary | ICD-10-CM | POA: Diagnosis not present

## 2019-10-21 DIAGNOSIS — E782 Mixed hyperlipidemia: Secondary | ICD-10-CM | POA: Diagnosis not present

## 2019-10-21 DIAGNOSIS — K219 Gastro-esophageal reflux disease without esophagitis: Secondary | ICD-10-CM | POA: Diagnosis not present

## 2019-11-18 DIAGNOSIS — G4733 Obstructive sleep apnea (adult) (pediatric): Secondary | ICD-10-CM | POA: Diagnosis not present

## 2020-06-02 DIAGNOSIS — E782 Mixed hyperlipidemia: Secondary | ICD-10-CM | POA: Diagnosis not present

## 2020-06-02 DIAGNOSIS — I10 Essential (primary) hypertension: Secondary | ICD-10-CM | POA: Diagnosis not present

## 2020-06-02 DIAGNOSIS — E1140 Type 2 diabetes mellitus with diabetic neuropathy, unspecified: Secondary | ICD-10-CM | POA: Diagnosis not present

## 2020-06-02 DIAGNOSIS — K219 Gastro-esophageal reflux disease without esophagitis: Secondary | ICD-10-CM | POA: Diagnosis not present

## 2020-11-20 DIAGNOSIS — G4733 Obstructive sleep apnea (adult) (pediatric): Secondary | ICD-10-CM | POA: Diagnosis not present

## 2020-12-22 DIAGNOSIS — K219 Gastro-esophageal reflux disease without esophagitis: Secondary | ICD-10-CM | POA: Diagnosis not present

## 2020-12-22 DIAGNOSIS — E782 Mixed hyperlipidemia: Secondary | ICD-10-CM | POA: Diagnosis not present

## 2020-12-22 DIAGNOSIS — E1140 Type 2 diabetes mellitus with diabetic neuropathy, unspecified: Secondary | ICD-10-CM | POA: Diagnosis not present

## 2020-12-22 DIAGNOSIS — I10 Essential (primary) hypertension: Secondary | ICD-10-CM | POA: Diagnosis not present

## 2021-01-26 DIAGNOSIS — Z1231 Encounter for screening mammogram for malignant neoplasm of breast: Secondary | ICD-10-CM | POA: Diagnosis not present

## 2021-03-09 DIAGNOSIS — F32A Depression, unspecified: Secondary | ICD-10-CM | POA: Diagnosis present

## 2021-03-09 DIAGNOSIS — Z6841 Body Mass Index (BMI) 40.0 and over, adult: Secondary | ICD-10-CM | POA: Diagnosis not present

## 2021-03-09 DIAGNOSIS — Z01419 Encounter for gynecological examination (general) (routine) without abnormal findings: Secondary | ICD-10-CM | POA: Diagnosis not present

## 2021-03-12 DIAGNOSIS — H353131 Nonexudative age-related macular degeneration, bilateral, early dry stage: Secondary | ICD-10-CM | POA: Diagnosis not present

## 2021-08-09 ENCOUNTER — Other Ambulatory Visit: Payer: Self-pay

## 2021-08-09 ENCOUNTER — Emergency Department (HOSPITAL_BASED_OUTPATIENT_CLINIC_OR_DEPARTMENT_OTHER)
Admission: EM | Admit: 2021-08-09 | Discharge: 2021-08-09 | Disposition: A | Discharge status: Home / Self Care | Payer: 59 | Attending: Emergency Medicine | Admitting: Emergency Medicine

## 2021-08-09 ENCOUNTER — Emergency Department (HOSPITAL_BASED_OUTPATIENT_CLINIC_OR_DEPARTMENT_OTHER): Payer: 59

## 2021-08-09 ENCOUNTER — Encounter (HOSPITAL_BASED_OUTPATIENT_CLINIC_OR_DEPARTMENT_OTHER): Payer: Self-pay

## 2021-08-09 DIAGNOSIS — I10 Essential (primary) hypertension: Secondary | ICD-10-CM | POA: Insufficient documentation

## 2021-08-09 DIAGNOSIS — Z7984 Long term (current) use of oral hypoglycemic drugs: Secondary | ICD-10-CM | POA: Insufficient documentation

## 2021-08-09 DIAGNOSIS — E119 Type 2 diabetes mellitus without complications: Secondary | ICD-10-CM | POA: Insufficient documentation

## 2021-08-09 DIAGNOSIS — Z7982 Long term (current) use of aspirin: Secondary | ICD-10-CM | POA: Insufficient documentation

## 2021-08-09 DIAGNOSIS — R110 Nausea: Secondary | ICD-10-CM | POA: Insufficient documentation

## 2021-08-09 DIAGNOSIS — R1084 Generalized abdominal pain: Secondary | ICD-10-CM | POA: Diagnosis not present

## 2021-08-09 DIAGNOSIS — Z79899 Other long term (current) drug therapy: Secondary | ICD-10-CM | POA: Insufficient documentation

## 2021-08-09 DIAGNOSIS — R1013 Epigastric pain: Secondary | ICD-10-CM

## 2021-08-09 LAB — CBC WITH DIFFERENTIAL/PLATELET
Abs Immature Granulocytes: 0.01 10*3/uL (ref 0.00–0.07)
Basophils Absolute: 0.1 10*3/uL (ref 0.0–0.1)
Basophils Relative: 1 %
Eosinophils Absolute: 0.2 10*3/uL (ref 0.0–0.5)
Eosinophils Relative: 3 %
HCT: 35.6 % — ABNORMAL LOW (ref 36.0–46.0)
Hemoglobin: 10.6 g/dL — ABNORMAL LOW (ref 12.0–15.0)
Immature Granulocytes: 0 %
Lymphocytes Relative: 38 %
Lymphs Abs: 2.4 10*3/uL (ref 0.7–4.0)
MCH: 24.9 pg — ABNORMAL LOW (ref 26.0–34.0)
MCHC: 29.8 g/dL — ABNORMAL LOW (ref 30.0–36.0)
MCV: 83.6 fL (ref 80.0–100.0)
Monocytes Absolute: 0.5 10*3/uL (ref 0.1–1.0)
Monocytes Relative: 8 %
Neutro Abs: 3.2 10*3/uL (ref 1.7–7.7)
Neutrophils Relative %: 50 %
Platelets: 412 10*3/uL — ABNORMAL HIGH (ref 150–400)
RBC: 4.26 MIL/uL (ref 3.87–5.11)
RDW: 15.3 % (ref 11.5–15.5)
WBC: 6.4 10*3/uL (ref 4.0–10.5)
nRBC: 0 % (ref 0.0–0.2)

## 2021-08-09 LAB — URINALYSIS, ROUTINE W REFLEX MICROSCOPIC
Bilirubin Urine: NEGATIVE
Glucose, UA: 500 mg/dL — AB
Hgb urine dipstick: NEGATIVE
Ketones, ur: NEGATIVE mg/dL
Nitrite: NEGATIVE
Protein, ur: NEGATIVE mg/dL
Specific Gravity, Urine: 1.007 (ref 1.005–1.030)
pH: 6.5 (ref 5.0–8.0)

## 2021-08-09 LAB — COMPREHENSIVE METABOLIC PANEL
ALT: 25 U/L (ref 0–44)
AST: 22 U/L (ref 15–41)
Albumin: 4.5 g/dL (ref 3.5–5.0)
Alkaline Phosphatase: 36 U/L — ABNORMAL LOW (ref 38–126)
Anion gap: 10 (ref 5–15)
BUN: 16 mg/dL (ref 8–23)
CO2: 28 mmol/L (ref 22–32)
Calcium: 9.8 mg/dL (ref 8.9–10.3)
Chloride: 100 mmol/L (ref 98–111)
Creatinine, Ser: 0.76 mg/dL (ref 0.44–1.00)
GFR, Estimated: >60 mL/min (ref >60)
Glucose, Bld: 115 mg/dL — ABNORMAL HIGH (ref 70–99)
Potassium: 4.2 mmol/L (ref 3.5–5.1)
Sodium: 138 mmol/L (ref 135–145)
Total Bilirubin: 0.5 mg/dL (ref 0.3–1.2)
Total Protein: 7.9 g/dL (ref 6.5–8.1)

## 2021-08-09 LAB — LIPASE, BLOOD: Lipase: 30 U/L (ref 11–51)

## 2021-08-09 MED ORDER — ONDANSETRON 4 MG PO TBDP: 4.0000 mg | ORAL_TABLET | Freq: Three times a day (TID) | ORAL | 0 refills | Status: DC | PRN

## 2021-08-09 MED ORDER — ALUM & MAG HYDROXIDE-SIMETH 200-200-20 MG/5ML PO SUSP
30.0000 mL | Freq: Once | ORAL | Status: AC
  Administered 2021-08-09: 30 mL via ORAL
  Filled 2021-08-09: qty 30

## 2021-08-09 MED ORDER — MORPHINE SULFATE (PF) 4 MG/ML IV SOLN
4.0000 mg | Freq: Once | INTRAVENOUS | Status: AC
  Administered 2021-08-09: 4 mg via INTRAVENOUS
  Filled 2021-08-09: qty 1

## 2021-08-09 MED ORDER — LORAZEPAM 2 MG/ML IJ SOLN
1.0000 mg | Freq: Once | INTRAMUSCULAR | Status: AC
  Administered 2021-08-09: 1 mg via INTRAVENOUS
  Filled 2021-08-09: qty 1

## 2021-08-09 MED ORDER — FAMOTIDINE IN NACL 20-0.9 MG/50ML-% IV SOLN
20.0000 mg | Freq: Once | INTRAVENOUS | Status: AC
Start: 2021-08-09 — End: 2021-08-09
  Administered 2021-08-09: 20 mg via INTRAVENOUS
  Filled 2021-08-09: qty 50

## 2021-08-09 MED ORDER — IOHEXOL 300 MG/ML  SOLN
100.0000 mL | Freq: Once | INTRAMUSCULAR | Status: AC | PRN
  Administered 2021-08-09: 100 mL via INTRAVENOUS

## 2021-08-09 MED ORDER — HYDROMORPHONE HCL 1 MG/ML IJ SOLN
1.0000 mg | Freq: Once | INTRAMUSCULAR | Status: AC
  Administered 2021-08-09: 1 mg via INTRAVENOUS
  Filled 2021-08-09: qty 1

## 2021-08-09 MED ORDER — SODIUM CHLORIDE 0.9 % IV BOLUS
1000.0000 mL | Freq: Once | INTRAVENOUS | Status: AC
  Administered 2021-08-09: 1000 mL via INTRAVENOUS

## 2021-08-09 MED ORDER — ONDANSETRON HCL 4 MG/2ML IJ SOLN
4.0000 mg | Freq: Once | INTRAMUSCULAR | Status: AC
  Administered 2021-08-09: 4 mg via INTRAVENOUS
  Filled 2021-08-09: qty 2

## 2021-08-09 MED ORDER — PANTOPRAZOLE SODIUM 40 MG PO TBEC: 40.0000 mg | DELAYED_RELEASE_TABLET | Freq: Every day | ORAL | 0 refills | Status: DC

## 2021-08-09 MED ORDER — HYDROCODONE-ACETAMINOPHEN 5-325 MG PO TABS
1.0000 | ORAL_TABLET | ORAL | 0 refills | Status: DC | PRN
Start: 2021-08-09 — End: 2021-08-28

## 2021-08-09 NOTE — Discharge Instructions (Addendum)
Stop the Prilosec (omeprazole) and start Protonix (pantroprazole). ? ?Call [facility] for an appt. ?

## 2021-08-09 NOTE — ED Provider Notes (Signed)
Pt signed out by Dr. Stark Jock pending CT results and symptomatic improvement. ? ?I reviewed pt's CT scan.   ? ?  ?IMPRESSION:  ?1. No acute findings are noted in the abdomen or pelvis to account  ?for the patient's symptoms.  ?2. Hepatic steatosis.  ?3. 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  ? ?I agree with the radiologist's interpretation. ? ?Labs reviewed and show nothing acute. ? ?Pt still has epigastric pain.  PT given a gi cocktail, pepcid, morphine, dilaudid and ativan and is finally feeling better.  O2 sat dropped briefly after the dilaudid, but are now back to nl on RA.  Pt said she started having pain after using etodolac.  I am going to d/c her with protonix instead of prilosec.  She is also instructed to f/u with GI.  She is an [facility] pt, so will need to call [facility] for an appt.  She knows to return if worse.  ? ? ?  ?Lauren Pence, MD ?08/09/21 1011 ? ?

## 2021-08-09 NOTE — ED Notes (Signed)
RT [place] ED note: Pts. Oxygen saturations dropping periodically, in to see/assess pt., has (+) hx. CPAP use and was given pain medicine while here, placed on 2 lpm n/c, RT to monitor. ?

## 2021-08-09 NOTE — ED Provider Notes (Signed)
?[facility]ER[facility] DEPT ?Provider Note ? ? ?CSN: [id number] ?Arrival date & time: 08/09/21  0555 ? ?  ? ?History ? ?Chief Complaint  ?Patient presents with  ? Abdominal Pain  ? ? ?Lauren Schmidt is a 61 y.o. female. ? ?Patient is a 61 year old female with past medical history of prior small bowel obstruction x2 with surgery to repair a hiatal hernia and adhesions.  Patient also has history of hypertension, diabetes, GERD.  She presents today with complaints of abdominal pain.  She describes generalized abdominal cramping that has been worsening over the past several days.  She reports having not had a bowel movement in several days.  She has not vomited, but became nauseated and almost vomited after drinking her coffee this morning.  Symptoms that she is describing today are similar to what she experienced with prior bowel obstructions. ? ?The history is provided by the patient.  ?Abdominal Pain ?Pain location:  Generalized ?Pain quality: cramping   ?Pain radiates to:  Does not radiate ?Pain severity:  Moderate ?Onset quality:  Gradual ?Duration:  3 days ?Timing:  Intermittent ?Progression:  Worsening ?Chronicity:  Recurrent ?Relieved by:  Nothing ?Worsened by:  Movement and palpation ? ?  ? ?Home Medications ?Prior to Admission medications   ?Medication Sig Start Date End Date Taking? Authorizing Provider  ?ALPRAZolam (XANAX) 0.5 MG tablet Take 0.5 mg by mouth at bedtime as needed for sleep or anxiety.    [provider]  ?aspirin EC 81 MG tablet Take 81 mg by mouth every evening.    [provider]  ?dapagliflozin propanediol (FARXIGA) 5 MG TABS tablet Take 5 mg by mouth daily.    [provider]  ?fenofibrate (TRICOR) 145 MG tablet Take 145 mg by mouth daily.    [provider]  ?glimepiride (AMARYL) 4 MG tablet Take 4 mg by mouth daily with breakfast.    [provider]  ?HYDROcodone-acetaminophen (NORCO) 5-325 MG tablet 1-2 tabs po q6 hours prn pain  10/24/16   Leanora Cover, MD  ?lisinopril (PRINIVIL,ZESTRIL) 20 MG tablet Take 20 mg by mouth daily.    [provider]  ?metFORMIN (GLUCOPHAGE) 500 MG tablet Take 1,000 mg by mouth 2 (two) times daily with a meal. Takes 2 tablets twice a day.     [provider]  ?metoprolol succinate (TOPROL-XL) 50 MG 24 hr tablet Take 50 mg by mouth daily. Take with or immediately following a meal.    [provider]  ?Multiple Vitamin (MULTIVITAMIN) LIQD Take 5 mLs by mouth daily.    [provider]  ?omega-3 acid ethyl esters (LOVAZA) 1 g capsule Take 2 g by mouth 2 (two) times daily.     [provider]  ?omeprazole (PRILOSEC) 20 MG capsule Take 20 mg by mouth 2 (two) times daily before a meal.     [provider]  ?sertraline (ZOLOFT) 100 MG tablet Take 200 mg by mouth daily.  11/22/12   [provider]  ?simvastatin (ZOCOR) 40 MG tablet Take 40 mg by mouth every evening.    [provider]  ?   ? ?Allergies    ?Nitrofurantoin macrocrystal, Tape, Other, and Shellfish allergy   ? ?Review of Systems   ?Review of Systems  ?Gastrointestinal:  Positive for abdominal pain.  ?All other systems reviewed and are negative. ? ?Physical Exam ?Updated Vital Signs ?BP (!) 145/70   Pulse 77   Temp (!) 97.5 ?F (36.4 ?C) (Oral)   Resp 17  Ht 5' (1.524 m)   Wt 117.9 kg   SpO2 99%   BMI 50.78 kg/m?  ?Physical Exam ?Vitals and nursing note reviewed.  ?Constitutional:   ?   General: She is not in acute distress. ?   Appearance: She is well-developed. She is not diaphoretic.  ?HENT:  ?   Head: Normocephalic and atraumatic.  ?Cardiovascular:  ?   Rate and Rhythm: Normal rate and regular rhythm.  ?   Heart sounds: No murmur heard. ?  No friction rub. No gallop.  ?Pulmonary:  ?   Effort: Pulmonary effort is normal. No respiratory distress.  ?   Breath sounds: Normal breath sounds. No wheezing.  ?Abdominal:  ?   General: Bowel sounds are normal. There is no distension.  ?    Palpations: Abdomen is soft.  ?   Tenderness: There is generalized abdominal tenderness. There is no right CVA tenderness, left CVA tenderness, guarding or rebound.  ?Musculoskeletal:     ?   General: Normal range of motion.  ?   Cervical back: Normal range of motion and neck supple.  ?Skin: ?   General: Skin is warm and dry.  ?Neurological:  ?   General: No focal deficit present.  ?   Mental Status: She is alert and oriented to person, place, and time.  ? ? ?ED Results / Procedures / Treatments   ?Labs ?(all labs ordered are listed, but only abnormal results are displayed) ?Labs Reviewed  ?CBC WITH DIFFERENTIAL/PLATELET - Abnormal; Notable for the following components:  ?    Result Value  ? Hemoglobin 10.6 (*)   ? HCT 35.6 (*)   ? MCH 24.9 (*)   ? MCHC 29.8 (*)   ? Platelets 412 (*)   ? All other components within normal limits  ?LIPASE, BLOOD  ?COMPREHENSIVE METABOLIC PANEL  ? ? ?EKG ?None ? ?Radiology ?No results found. ? ?Procedures ?Procedures  ? ? ?Medications Ordered in ED ?Medications - No data to display ? ?ED Course/ Medical Decision Making/ A&P ? ?Patient presenting with complaints of abdominal pain and no bowel movement for the past 3 days.  She has history of prior bowel obstructions and this feels similar.  Work-up initiated including laboratory studies and CT scan ordered.  Care signed out to oncoming provider at shift change to obtain the results of these studies and determine the final disposition. ? ?Final Clinical Impression(s) / ED Diagnoses ?Final diagnoses:  ?None  ? ? ?Rx / DC Orders ?ED Discharge Orders   ? ? None  ? ?  ? ? ?  ?Veryl Speak, MD ?08/09/21 2258 ? ?

## 2021-08-09 NOTE — ED Triage Notes (Signed)
Reports generalized abdominal pain and no BM since last Thursday. Hx of multiple sbo.  ? ?Woke up this morning to drink coffee and had a burning sensation in chest and nausea.  ?

## 2021-08-14 ENCOUNTER — Other Ambulatory Visit: Payer: Self-pay | Admitting: Gastroenterology

## 2021-08-14 DIAGNOSIS — R1310 Dysphagia, unspecified: Secondary | ICD-10-CM

## 2021-08-14 DIAGNOSIS — R1013 Epigastric pain: Secondary | ICD-10-CM

## 2021-08-17 ENCOUNTER — Ambulatory Visit
Admission: RE | Admit: 2021-08-17 | Discharge: 2021-08-17 | Disposition: A | Discharge status: Home / Self Care | Payer: 59 | Source: Ambulatory Visit | Attending: Gastroenterology | Admitting: Gastroenterology

## 2021-08-17 DIAGNOSIS — R1310 Dysphagia, unspecified: Secondary | ICD-10-CM

## 2021-08-17 DIAGNOSIS — R1013 Epigastric pain: Secondary | ICD-10-CM

## 2021-08-20 ENCOUNTER — Telehealth: Payer: Self-pay

## 2021-08-20 NOTE — Telephone Encounter (Signed)
NOTES SCANNED TO REFERRAL 

## 2021-08-21 ENCOUNTER — Emergency Department (HOSPITAL_COMMUNITY): Payer: 59

## 2021-08-21 ENCOUNTER — Encounter (HOSPITAL_COMMUNITY): Payer: Self-pay

## 2021-08-21 ENCOUNTER — Inpatient Hospital Stay (HOSPITAL_COMMUNITY)
Admission: EM | Admit: 2021-08-21 | Discharge: 2021-08-28 | DRG: 418 | Disposition: A | Discharge status: Home / Self Care | Payer: 59 | Attending: Internal Medicine | Admitting: Internal Medicine

## 2021-08-21 ENCOUNTER — Other Ambulatory Visit: Payer: Self-pay

## 2021-08-21 ENCOUNTER — Observation Stay (HOSPITAL_BASED_OUTPATIENT_CLINIC_OR_DEPARTMENT_OTHER): Payer: 59

## 2021-08-21 DIAGNOSIS — D509 Iron deficiency anemia, unspecified: Secondary | ICD-10-CM | POA: Diagnosis present

## 2021-08-21 DIAGNOSIS — E119 Type 2 diabetes mellitus without complications: Secondary | ICD-10-CM

## 2021-08-21 DIAGNOSIS — Z6841 Body Mass Index (BMI) 40.0 and over, adult: Secondary | ICD-10-CM

## 2021-08-21 DIAGNOSIS — G8929 Other chronic pain: Secondary | ICD-10-CM

## 2021-08-21 DIAGNOSIS — K8010 Calculus of gallbladder with chronic cholecystitis without obstruction: Secondary | ICD-10-CM | POA: Diagnosis not present

## 2021-08-21 DIAGNOSIS — Z833 Family history of diabetes mellitus: Secondary | ICD-10-CM

## 2021-08-21 DIAGNOSIS — I10 Essential (primary) hypertension: Secondary | ICD-10-CM | POA: Diagnosis not present

## 2021-08-21 DIAGNOSIS — Z8249 Family history of ischemic heart disease and other diseases of the circulatory system: Secondary | ICD-10-CM

## 2021-08-21 DIAGNOSIS — Z91013 Allergy to seafood: Secondary | ICD-10-CM

## 2021-08-21 DIAGNOSIS — R1310 Dysphagia, unspecified: Secondary | ICD-10-CM

## 2021-08-21 DIAGNOSIS — R1013 Epigastric pain: Secondary | ICD-10-CM | POA: Diagnosis present

## 2021-08-21 DIAGNOSIS — N390 Urinary tract infection, site not specified: Secondary | ICD-10-CM | POA: Diagnosis present

## 2021-08-21 DIAGNOSIS — E7800 Pure hypercholesterolemia, unspecified: Secondary | ICD-10-CM | POA: Diagnosis not present

## 2021-08-21 DIAGNOSIS — Z87891 Personal history of nicotine dependence: Secondary | ICD-10-CM

## 2021-08-21 DIAGNOSIS — Z888 Allergy status to other drugs, medicaments and biological substances status: Secondary | ICD-10-CM

## 2021-08-21 DIAGNOSIS — E113293 Type 2 diabetes mellitus with mild nonproliferative diabetic retinopathy without macular edema, bilateral: Secondary | ICD-10-CM | POA: Diagnosis present

## 2021-08-21 DIAGNOSIS — E785 Hyperlipidemia, unspecified: Secondary | ICD-10-CM

## 2021-08-21 DIAGNOSIS — K449 Diaphragmatic hernia without obstruction or gangrene: Secondary | ICD-10-CM | POA: Diagnosis present

## 2021-08-21 DIAGNOSIS — R079 Chest pain, unspecified: Secondary | ICD-10-CM

## 2021-08-21 DIAGNOSIS — Z7984 Long term (current) use of oral hypoglycemic drugs: Secondary | ICD-10-CM

## 2021-08-21 DIAGNOSIS — I2510 Atherosclerotic heart disease of native coronary artery without angina pectoris: Secondary | ICD-10-CM | POA: Diagnosis present

## 2021-08-21 DIAGNOSIS — K828 Other specified diseases of gallbladder: Secondary | ICD-10-CM | POA: Diagnosis present

## 2021-08-21 DIAGNOSIS — E1142 Type 2 diabetes mellitus with diabetic polyneuropathy: Secondary | ICD-10-CM | POA: Diagnosis present

## 2021-08-21 DIAGNOSIS — G479 Sleep disorder, unspecified: Secondary | ICD-10-CM | POA: Diagnosis present

## 2021-08-21 DIAGNOSIS — K2970 Gastritis, unspecified, without bleeding: Secondary | ICD-10-CM

## 2021-08-21 DIAGNOSIS — F32A Depression, unspecified: Secondary | ICD-10-CM | POA: Diagnosis present

## 2021-08-21 DIAGNOSIS — Z79899 Other long term (current) drug therapy: Secondary | ICD-10-CM

## 2021-08-21 DIAGNOSIS — G4733 Obstructive sleep apnea (adult) (pediatric): Secondary | ICD-10-CM | POA: Diagnosis present

## 2021-08-21 DIAGNOSIS — K7581 Nonalcoholic steatohepatitis (NASH): Secondary | ICD-10-CM | POA: Diagnosis present

## 2021-08-21 DIAGNOSIS — I259 Chronic ischemic heart disease, unspecified: Principal | ICD-10-CM

## 2021-08-21 DIAGNOSIS — Z9109 Other allergy status, other than to drugs and biological substances: Secondary | ICD-10-CM

## 2021-08-21 DIAGNOSIS — K565 Intestinal adhesions [bands], unspecified as to partial versus complete obstruction: Secondary | ICD-10-CM | POA: Diagnosis present

## 2021-08-21 DIAGNOSIS — F419 Anxiety disorder, unspecified: Secondary | ICD-10-CM | POA: Diagnosis present

## 2021-08-21 DIAGNOSIS — K219 Gastro-esophageal reflux disease without esophagitis: Secondary | ICD-10-CM | POA: Diagnosis present

## 2021-08-21 LAB — CBC
HCT: 36 % (ref 36.0–46.0)
HCT: 32 % — ABNORMAL LOW (ref 36.0–46.0)
Hemoglobin: 10.7 g/dL — ABNORMAL LOW (ref 12.0–15.0)
Hemoglobin: 10.1 g/dL — ABNORMAL LOW (ref 12.0–15.0)
MCH: 25.4 pg — ABNORMAL LOW (ref 26.0–34.0)
MCH: 26.3 pg (ref 26.0–34.0)
MCHC: 29.7 g/dL — ABNORMAL LOW (ref 30.0–36.0)
MCHC: 31.6 g/dL (ref 30.0–36.0)
MCV: 85.5 fL (ref 80.0–100.0)
MCV: 83.3 fL (ref 80.0–100.0)
Platelets: 404 10*3/uL — ABNORMAL HIGH (ref 150–400)
Platelets: 360 10*3/uL (ref 150–400)
RBC: 4.21 MIL/uL (ref 3.87–5.11)
RBC: 3.84 MIL/uL — ABNORMAL LOW (ref 3.87–5.11)
RDW: 15.1 % (ref 11.5–15.5)
RDW: 15 % (ref 11.5–15.5)
WBC: 6.2 10*3/uL (ref 4.0–10.5)
WBC: 5.3 10*3/uL (ref 4.0–10.5)
nRBC: 0 % (ref 0.0–0.2)
nRBC: 0 % (ref 0.0–0.2)

## 2021-08-21 LAB — COMPREHENSIVE METABOLIC PANEL
ALT: 25 U/L (ref 0–44)
AST: 35 U/L (ref 15–41)
Albumin: 4.1 g/dL (ref 3.5–5.0)
Alkaline Phosphatase: 33 U/L — ABNORMAL LOW (ref 38–126)
Anion gap: 9 (ref 5–15)
BUN: 18 mg/dL (ref 8–23)
CO2: 26 mmol/L (ref 22–32)
Calcium: 9.3 mg/dL (ref 8.9–10.3)
Chloride: 103 mmol/L (ref 98–111)
Creatinine, Ser: 0.79 mg/dL (ref 0.44–1.00)
GFR, Estimated: >60 mL/min (ref >60)
Glucose, Bld: 99 mg/dL (ref 70–99)
Potassium: 4.2 mmol/L (ref 3.5–5.1)
Sodium: 138 mmol/L (ref 135–145)
Total Bilirubin: 0.7 mg/dL (ref 0.3–1.2)
Total Protein: 7.9 g/dL (ref 6.5–8.1)

## 2021-08-21 LAB — ECHOCARDIOGRAM COMPLETE
AR max vel: 2.15 cm2
AV Area VTI: 2.22 cm2
AV Area mean vel: 2.16 cm2
AV Mean grad: 5 mmHg
AV Peak grad: 8.9 mmHg
Ao pk vel: 1.49 m/s
Area-P 1/2: 4.89 cm2
Calc EF: 53.9 %
Height: 60 in
S' Lateral: 3 cm
Single Plane A2C EF: 54 %
Single Plane A4C EF: 58.1 %
Weight: 4160 oz

## 2021-08-21 LAB — URINALYSIS, ROUTINE W REFLEX MICROSCOPIC
Bilirubin Urine: NEGATIVE
Glucose, UA: 150 mg/dL — AB
Hgb urine dipstick: NEGATIVE
Ketones, ur: NEGATIVE mg/dL
Nitrite: POSITIVE — AB
Protein, ur: NEGATIVE mg/dL
Specific Gravity, Urine: 1.031 — ABNORMAL HIGH (ref 1.005–1.030)
pH: 5 (ref 5.0–8.0)

## 2021-08-21 LAB — LIPASE, BLOOD: Lipase: 35 U/L (ref 11–51)

## 2021-08-21 LAB — CREATININE, SERUM
Creatinine, Ser: 0.75 mg/dL (ref 0.44–1.00)
GFR, Estimated: >60 mL/min (ref >60)

## 2021-08-21 LAB — TROPONIN I (HIGH SENSITIVITY)
Troponin I (High Sensitivity): 69 ng/L — ABNORMAL HIGH (ref <18)
Troponin I (High Sensitivity): 3 ng/L (ref <18)

## 2021-08-21 LAB — SEDIMENTATION RATE
Sed Rate: 30 mm/hr — ABNORMAL HIGH (ref 0–22)
Sed Rate: 25 mm/hr — ABNORMAL HIGH (ref 0–22)

## 2021-08-21 LAB — C-REACTIVE PROTEIN: CRP: <0.5 mg/dL (ref <1.0)

## 2021-08-21 LAB — CBG MONITORING, ED: Glucose-Capillary: 130 mg/dL — ABNORMAL HIGH (ref 70–99)

## 2021-08-21 LAB — HIV ANTIBODY (ROUTINE TESTING W REFLEX): HIV Screen 4th Generation wRfx: NONREACTIVE

## 2021-08-21 LAB — CK: Total CK: 52 U/L (ref 38–234)

## 2021-08-21 MED ORDER — METOPROLOL SUCCINATE ER 50 MG PO TB24
50.0000 mg | ORAL_TABLET | Freq: Every day | ORAL | Status: DC
  Administered 2021-08-21 – 2021-08-28 (×7): 50 mg via ORAL
  Filled 2021-08-21 (×8): qty 1

## 2021-08-21 MED ORDER — INSULIN ASPART 100 UNIT/ML IJ SOLN
0.0000 [IU] | Freq: Every day | INTRAMUSCULAR | Status: DC
  Filled 2021-08-21: qty 0.05

## 2021-08-21 MED ORDER — LIDOCAINE VISCOUS HCL 2 % MT SOLN
15.0000 mL | Freq: Once | OROMUCOSAL | Status: AC
  Administered 2021-08-21: 15 mL via ORAL
  Filled 2021-08-21: qty 15

## 2021-08-21 MED ORDER — MORPHINE SULFATE (PF) 4 MG/ML IV SOLN
4.0000 mg | INTRAVENOUS | Status: DC | PRN
  Administered 2021-08-21 – 2021-08-23 (×6): 4 mg via INTRAVENOUS
  Filled 2021-08-21 (×6): qty 1

## 2021-08-21 MED ORDER — PERFLUTREN LIPID MICROSPHERE
1.0000 mL | INTRAVENOUS | Status: AC | PRN
  Administered 2021-08-21: 4 mL via INTRAVENOUS

## 2021-08-21 MED ORDER — ENOXAPARIN SODIUM 40 MG/0.4ML IJ SOSY
40.0000 mg | PREFILLED_SYRINGE | INTRAMUSCULAR | Status: DC
  Administered 2021-08-21 – 2021-08-26 (×6): 40 mg via SUBCUTANEOUS
  Filled 2021-08-21 (×8): qty 0.4

## 2021-08-21 MED ORDER — SODIUM CHLORIDE 0.9 % IV SOLN
1.0000 g | INTRAVENOUS | Status: DC
  Administered 2021-08-21 – 2021-08-23 (×3): 1 g via INTRAVENOUS
  Filled 2021-08-21 (×4): qty 10

## 2021-08-21 MED ORDER — SERTRALINE HCL 100 MG PO TABS
200.0000 mg | ORAL_TABLET | Freq: Every day | ORAL | Status: DC
  Administered 2021-08-21 – 2021-08-28 (×8): 200 mg via ORAL
  Filled 2021-08-21 (×4): qty 2
  Filled 2021-08-21: qty 4
  Filled 2021-08-21 (×3): qty 2

## 2021-08-21 MED ORDER — SUCRALFATE 1 G PO TABS
1.0000 g | ORAL_TABLET | Freq: Two times a day (BID) | ORAL | Status: DC
  Administered 2021-08-21 – 2021-08-25 (×6): 1 g via ORAL
  Filled 2021-08-21 (×8): qty 1

## 2021-08-21 MED ORDER — METOPROLOL TARTRATE 50 MG PO TABS
100.0000 mg | ORAL_TABLET | Freq: Once | ORAL | Status: AC
  Administered 2021-08-22: 100 mg via ORAL
  Filled 2021-08-21: qty 2

## 2021-08-21 MED ORDER — ACETAMINOPHEN 325 MG PO TABS
650.0000 mg | ORAL_TABLET | ORAL | Status: DC | PRN
  Administered 2021-08-23 – 2021-08-26 (×4): 650 mg via ORAL
  Filled 2021-08-21 (×5): qty 2

## 2021-08-21 MED ORDER — SODIUM CHLORIDE (PF) 0.9 % IJ SOLN
INTRAMUSCULAR | Status: AC
  Filled 2021-08-21: qty 50

## 2021-08-21 MED ORDER — INSULIN ASPART 100 UNIT/ML IJ SOLN
0.0000 [IU] | Freq: Three times a day (TID) | INTRAMUSCULAR | Status: DC
  Administered 2021-08-22: 2 [IU] via SUBCUTANEOUS
  Administered 2021-08-22: 3 [IU] via SUBCUTANEOUS
  Administered 2021-08-23 – 2021-08-26 (×7): 2 [IU] via SUBCUTANEOUS
  Administered 2021-08-27: 3 [IU] via SUBCUTANEOUS
  Administered 2021-08-27 – 2021-08-28 (×2): 2 [IU] via SUBCUTANEOUS
  Filled 2021-08-21: qty 0.15

## 2021-08-21 MED ORDER — IOHEXOL 350 MG/ML SOLN
80.0000 mL | Freq: Once | INTRAVENOUS | Status: AC | PRN
  Administered 2021-08-21: 80 mL via INTRAVENOUS

## 2021-08-21 MED ORDER — ALPRAZOLAM 0.5 MG PO TABS
0.5000 mg | ORAL_TABLET | Freq: Every evening | ORAL | Status: DC | PRN
  Administered 2021-08-21: 0.5 mg via ORAL
  Filled 2021-08-21 (×2): qty 1

## 2021-08-21 MED ORDER — ALPRAZOLAM 0.25 MG PO TABS: 0.2500 mg | ORAL_TABLET | Freq: Two times a day (BID) | ORAL | Status: DC | PRN

## 2021-08-21 MED ORDER — ONDANSETRON HCL 4 MG/2ML IJ SOLN
4.0000 mg | Freq: Four times a day (QID) | INTRAMUSCULAR | Status: DC | PRN
  Administered 2021-08-21 – 2021-08-22 (×2): 4 mg via INTRAVENOUS
  Filled 2021-08-21 (×2): qty 2

## 2021-08-21 MED ORDER — MORPHINE SULFATE (PF) 4 MG/ML IV SOLN
4.0000 mg | Freq: Once | INTRAVENOUS | Status: AC
  Administered 2021-08-21: 4 mg via INTRAVENOUS
  Filled 2021-08-21: qty 1

## 2021-08-21 MED ORDER — FENOFIBRATE 160 MG PO TABS
160.0000 mg | ORAL_TABLET | Freq: Every day | ORAL | Status: DC
Start: 2021-08-21 — End: 2021-08-28
  Administered 2021-08-21 – 2021-08-28 (×7): 160 mg via ORAL
  Filled 2021-08-21 (×9): qty 1

## 2021-08-21 MED ORDER — SODIUM CHLORIDE 0.9 % IV SOLN: INTRAVENOUS | Status: DC

## 2021-08-21 MED ORDER — SIMVASTATIN 40 MG PO TABS
40.0000 mg | ORAL_TABLET | Freq: Every evening | ORAL | Status: DC
  Administered 2021-08-21 – 2021-08-23 (×3): 40 mg via ORAL
  Filled 2021-08-21 (×3): qty 1

## 2021-08-21 MED ORDER — DAPAGLIFLOZIN PROPANEDIOL 5 MG PO TABS
5.0000 mg | ORAL_TABLET | Freq: Every day | ORAL | Status: DC
  Administered 2021-08-21 – 2021-08-28 (×7): 5 mg via ORAL
  Filled 2021-08-21 (×8): qty 1

## 2021-08-21 MED ORDER — LISINOPRIL 20 MG PO TABS
20.0000 mg | ORAL_TABLET | Freq: Every day | ORAL | Status: DC
  Administered 2021-08-21 – 2021-08-28 (×8): 20 mg via ORAL
  Filled 2021-08-21 (×2): qty 1
  Filled 2021-08-21: qty 2
  Filled 2021-08-21 (×5): qty 1

## 2021-08-21 MED ORDER — PANTOPRAZOLE SODIUM 40 MG PO TBEC
40.0000 mg | DELAYED_RELEASE_TABLET | Freq: Two times a day (BID) | ORAL | Status: DC
  Administered 2021-08-21 – 2021-08-28 (×13): 40 mg via ORAL
  Filled 2021-08-21 (×14): qty 1

## 2021-08-21 MED ORDER — ALUM & MAG HYDROXIDE-SIMETH 200-200-20 MG/5ML PO SUSP
30.0000 mL | Freq: Once | ORAL | Status: AC
  Administered 2021-08-21: 30 mL via ORAL
  Filled 2021-08-21: qty 30

## 2021-08-21 MED ORDER — IOHEXOL 300 MG/ML  SOLN
100.0000 mL | Freq: Once | INTRAMUSCULAR | Status: AC | PRN
  Administered 2021-08-21: 100 mL via INTRAVENOUS

## 2021-08-21 MED ORDER — PROCHLORPERAZINE EDISYLATE 10 MG/2ML IJ SOLN
10.0000 mg | Freq: Four times a day (QID) | INTRAMUSCULAR | Status: DC | PRN
Start: 2021-08-21 — End: 2021-08-28

## 2021-08-21 NOTE — ED Provider Notes (Signed)
?[facility]-EMERGENCY DEPT ?Provider Note ? ? ?CSN: [id number] ?Arrival date & time: 08/21/21  0502 ? ?  ? ?History ? ?Chief Complaint  ?Patient presents with  ? Abdominal Pain  ? Chest Pain  ? ? ?Lauren Schmidt is a 61 y.o. female with past medical history significant for umbilical hernia with gangrene and obstruction, small bowel obstruction, ventral hernia, hypertension, diabetes, hyperlipidemia, acid reflux, obesity who presents with concern for upper abdominal and lower chest pain which is burning in nature that has been ongoing for 3 weeks but became significantly worse this morning.  Patient reports the pain woke her up from sleep.  She endorses 10 out of 10 burning pain in the lower chest and upper abdomen.  She denies nausea, vomiting, shortness of breath.  She denies any exertional component of the pain.  She denies diaphoresis, lightheadedness, vision changes with the pain.  She denies previous TIA, stroke, she does not take any blood thinners.  No history of peripheral arterial disease.  Pain is not significantly left-sided in nature.  Patient does endorse bowel movements recently,  is passing gas. ? ? ?Abdominal Pain ?Associated symptoms: chest pain   ?Chest Pain ?Associated symptoms: abdominal pain   ? ?  ? ?Home Medications ?Prior to Admission medications   ?Medication Sig Start Date End Date Taking? Authorizing Provider  ?ALPRAZolam (XANAX) 0.5 MG tablet Take 0.5 mg by mouth at bedtime as needed for sleep or anxiety.   Yes [provider]  ?dapagliflozin propanediol (FARXIGA) 5 MG TABS tablet Take 5 mg by mouth at bedtime.   Yes [provider]  ?fenofibrate (TRICOR) 145 MG tablet Take 145 mg by mouth daily.   Yes [provider]  ?glimepiride (AMARYL) 4 MG tablet Take 4 mg by mouth daily with breakfast.   Yes [provider]  ?lisinopril (PRINIVIL,ZESTRIL) 20 MG tablet Take 20 mg by mouth daily.   Yes [provider]  ?metFORMIN  (GLUCOPHAGE) 500 MG tablet Take 1,000 mg by mouth 2 (two) times daily with a meal. Takes 2 tablets twice a day.    Yes [provider]  ?metoprolol succinate (TOPROL-XL) 50 MG 24 hr tablet Take 50 mg by mouth daily. Take with or immediately following a meal.   Yes [provider]  ?Multiple Vitamin (MULTIVITAMIN) LIQD Take 5 mLs by mouth daily.   Yes [provider]  ?ondansetron (ZOFRAN-ODT) 4 MG disintegrating tablet Take 1 tablet (4 mg total) by mouth every 8 (eight) hours as needed for nausea or vomiting. 08/09/21  Yes Jacalyn Lefevre, MD  ?pantoprazole (PROTONIX) 40 MG tablet Take 1 tablet (40 mg total) by mouth daily. ?Patient taking differently: Take 40 mg by mouth 2 (two) times daily. 08/09/21  Yes Jacalyn Lefevre, MD  ?sertraline (ZOLOFT) 100 MG tablet Take 200 mg by mouth daily.  11/22/12  Yes [provider]  ?simvastatin (ZOCOR) 40 MG tablet Take 40 mg by mouth every evening.   Yes [provider]  ?sucralfate (CARAFATE) 1 g tablet Take 1 g by mouth 2 (two) times daily.   Yes [provider]  ?HYDROcodone-acetaminophen (NORCO/VICODIN) 5-325 MG tablet Take 1 tablet by mouth every 4 (four) hours as needed. ?Patient not taking: Reported on 08/21/2021 08/09/21   Jacalyn Lefevre, MD  ?   ? ?Allergies    ?Nitrofurantoin macrocrystal, Tape, and Shellfish allergy   ? ?Review of Systems   ?Review of Systems  ?Cardiovascular:  Positive for chest pain.  ?Gastrointestinal:  Positive for  abdominal pain.  ?All other systems reviewed and are negative. ? ?Physical Exam ?Updated Vital Signs ?BP (!) 164/78   Pulse 71   Temp 98 ?F (36.7 ?C) (Oral)   Resp 18   SpO2 96%  ?Physical Exam ?Vitals and nursing note reviewed.  ?Constitutional:   ?   General: She is in acute distress.  ?   Appearance: Normal appearance. She is obese.  ?   Comments: Patient is in some distress, anxious on arrival, endorsing 10 out of 10 pain  ?HENT:  ?   Head: Normocephalic and atraumatic.  ?Eyes:  ?    General:     ?   Right eye: No discharge.     ?   Left eye: No discharge.  ?Cardiovascular:  ?   Rate and Rhythm: Normal rate and regular rhythm.  ?   Heart sounds: No murmur heard. ?  No friction rub. No gallop.  ?   Comments: Tenderness to palpation noted of the chest wall overlying the sternum ?Pulmonary:  ?   Effort: Pulmonary effort is normal.  ?   Breath sounds: Normal breath sounds.  ?Abdominal:  ?   General: Bowel sounds are normal.  ?   Palpations: Abdomen is soft.  ?   Comments: With protuberant abdomen with tenderness palpation in the epigastric region, and throughout.  She does have some guarding, without rebound or rigidity.  No palpable masses noted.  ?Skin: ?   General: Skin is warm and dry.  ?   Capillary Refill: Capillary refill takes less than 2 seconds.  ?Neurological:  ?   Mental Status: She is alert and oriented to person, place, and time.  ?Psychiatric:     ?   Mood and Affect: Mood normal.     ?   Behavior: Behavior normal.  ? ? ?ED Results / Procedures / Treatments   ?Labs ?(all labs ordered are listed, but only abnormal results are displayed) ?Labs Reviewed  ?CBC - Abnormal; Notable for the following components:  ?    Result Value  ? Hemoglobin 10.7 (*)   ? MCH 25.4 (*)   ? MCHC 29.7 (*)   ? Platelets 404 (*)   ? All other components within normal limits  ?COMPREHENSIVE METABOLIC PANEL - Abnormal; Notable for the following components:  ? Alkaline Phosphatase 33 (*)   ? All other components within normal limits  ?TROPONIN I (HIGH SENSITIVITY) - Abnormal; Notable for the following components:  ? Troponin I (High Sensitivity) 69 (*)   ? All other components within normal limits  ?LIPASE, BLOOD  ?TROPONIN I (HIGH SENSITIVITY)  ? ? ?EKG ?EKG Interpretation ? ?Date/Time:  Tuesday Aug 21 2021 05:10:16 EDT ?Ventricular Rate:  71 ?PR Interval:  163 ?QRS Duration: 112 ?QT Interval:  389 ?QTC Calculation: 423 ?R Axis:   19 ?Text Interpretation: Sinus rhythm Borderline intraventricular conduction  delay Borderline T abnormalities, inferior leads No significant change was found Confirmed by Kennis Carina [phone]) on 08/21/2021 6:03:50 AM ? ?Radiology ?DG Chest 2 View ? ?Result Date: 08/21/2021 ?CLINICAL DATA:  61 year old female with history of chest pain and upper abdominal pain intermittently for the past 3 weeks. EXAM: CHEST - 2 VIEW COMPARISON:  Chest x-ray 08/27/2011. FINDINGS: Lung volumes are low. No consolidative airspace disease. No pleural effusions. No pneumothorax. No pulmonary nodule or mass noted. Pulmonary vasculature and the cardiomediastinal silhouette are within normal limits. IMPRESSION: 1. Low lung volumes without radiographic evidence of acute cardiopulmonary disease. Electronically Signed   By:  Trudie Reedaniel  Entrikin M.D.   On: 08/21/2021 05:52   ? ?Procedures ?Procedures  ? ? ?Medications Ordered in ED ?Medications  ?sodium chloride (PF) 0.9 % injection (has no administration in time range)  ?iohexol (OMNIPAQUE) 350 MG/ML injection 80 mL (has no administration in time range)  ?morphine (PF) 4 MG/ML injection 4 mg (4 mg Intravenous Given 08/21/21 0526)  ?iohexol (OMNIPAQUE) 300 MG/ML solution 100 mL (100 mLs Intravenous Contrast Given 08/21/21 0620)  ? ? ?ED Course/ Medical Decision Making/ A&P ?  ?                        ?Medical Decision Making ?Amount and/or Complexity of Data Reviewed ?Labs: ordered. ?Radiology: ordered. ? ?Risk ?Prescription drug management. ? ? ?This patient is a 61 y.o. female who presents to the ED for concern of upper abdominal pain, lower burning chest pain without shortness of breath, this involves an extensive number of treatment options, and is a complaint that carries with it a high risk of complications and morbidity. The emergent differential diagnosis prior to evaluation includes, but is not limited to, acute ACS, AAS, pulmonary embolism, pneumonia, Boerhaave's, Mallory-Weiss, acute mesenteric ischemia, other acute or surgical intra-abdominal abnormality including  small bowel obstruction, cholecystitis, cholangitis, appendicitis, diverticulitis..  ? ?This is not an exhaustive differential.  ? ?Past Medical History / Co-morbidities / Social History: ?Obesity, hypertension, hyper

## 2021-08-21 NOTE — ED Triage Notes (Signed)
Pt reports with chest pain and upper abdominal pain that has been going on x 3 weeks but has gotten worsen this morning.  ?

## 2021-08-21 NOTE — H&P (Addendum)
?History and Physical  ? ? ?Patient: Lauren Schmidt [id number] DOB: 1960-10-19 ?DOA: 08/21/2021 ?DOS: the patient was seen and examined on 08/21/2021 ?PCP: Seward Carol, MD  ?Patient coming from: Home ? ?Chief Complaint:  ?Chief Complaint  ?Patient presents with  ? Abdominal Pain  ? Chest Pain  ? ?HPI: Kyly Baert is a 61 y.o. female with medical history significant of HTN, HLD, DM2, morbid obesity. Presenting with epigastric abdominal pain and midsternal chest pain. Symptoms started 3 weeks ago. She has a burning and soreness in the epigastric and midsternal areas. She says her pain is constant with varying waves of increased intensity. She can not identify any triggering action: not associated w/ activity or eating. She tried OTC meds to help, but they didn't. She came to the ED on 08/09/21 and her w/u was negative. She improved on GI cocktail and pain meds. She was told to follow up with [facility] and sent home with protonix and carafate. [facility] ordered a barium swallow on 08/14/21 that was negative. The patient's symptoms intensified last night, so she decided to come back to the ED for evaluation. She denies any other aggravating or alleviating factors.    ? ?Review of Systems: As mentioned in the history of present illness. All other systems reviewed and are negative. ?Past Medical History:  ?Diagnosis Date  ? Anemia   ? Anxiety   ? Depression   ? Diabetes mellitus without complication ([place])   ? type 2  ? Family history of adverse reaction to anesthesia   ? sister's bp dropped low during surgery  ? Fatty liver   ? GERD (gastroesophageal reflux disease)   ? History of hiatal hernia   ? Hypercholesterolemia   ? Hypertension   ? Peripheral neuropathy   ? feet  ? Sleep apnea   ? uses cpap  ? Trigger thumb of both thumbs   ? Umbilical hernia with gangrene and obstruction 01/31/2013  ? Repaired on 01/31/13. SB resection done   ? ?Past Surgical History:  ?Procedure Laterality Date  ? BOWEL RESECTION N/A 01/31/2013  ?  Procedure: SMALL BOWEL RESECTION;  Surgeon: Merrie Roof, MD;  Location: [place];  Service: General;  Laterality: N/A;  ? COLON SURGERY    ? COLONOSCOPY    ? HERNIA REPAIR    ? INSERTION OF MESH N/A 07/01/2016  ? Procedure: INSERTION OF MESH;  Surgeon: Autumn Messing III, MD;  Location: [place];  Service: General;  Laterality: N/A;  ? KNEE ARTHROSCOPY Right 1990s?  ? [place]  07/01/2016  ? Right Knee arthroscopy    ? TRIGGER FINGER RELEASE Right 10/24/2016  ? Procedure: RELEASE TRIGGER FINGER/A-1 PULLEY RIGHT THUMB TRIGGER RELEASE;  Surgeon: Leanora Cover, MD;  Location: [place];  Service: Orthopedics;  Laterality: Right;  ? UMBILICAL HERNIA REPAIR N/A 01/31/2013  ? Procedure: HERNIA REPAIR UMBILICAL ADULT;  Surgeon: Merrie Roof, MD;  Location: [place];  Service: General;  Laterality: N/A;  ? VENTRAL HERNIA REPAIR N/A 07/01/2016  ? Procedure: LAPROSCOPIC ASSISTED VENTRAL HERNIA REPAIR;  Surgeon: Autumn Messing III, MD;  Location: [place];  Service: General;  Laterality: N/A;  ? ?Social History:  reports that she has quit smoking. She has never used smokeless tobacco. She reports that she does not drink alcohol and does not use drugs. ? ?Allergies  ?Allergen Reactions  ? Nitrofurantoin Macrocrystal Shortness Of Breath, Itching, Swelling and Anaphylaxis  ? Tape Other (See Comments)  ?  Leave red marks and welts  ? Shellfish Allergy Rash  ?  Rash   ? ? ?Family History  ?Problem Relation Age of Onset  ? Heart failure Mother   ? Aneurysm Mother   ? Diabetes Mother   ? ? ?Prior to Admission medications   ?Medication Sig Start Date End Date Taking? Authorizing Provider  ?ALPRAZolam (XANAX) 0.5 MG tablet Take 0.5 mg by mouth at bedtime as needed for sleep or anxiety.   Yes [provider]  ?dapagliflozin propanediol (FARXIGA) 5 MG TABS tablet Take 5 mg by mouth at bedtime.   Yes [provider]  ?fenofibrate (TRICOR) 145 MG tablet Take 145 mg by mouth daily.   Yes  [provider]  ?glimepiride (AMARYL) 4 MG tablet Take 4 mg by mouth daily with breakfast.   Yes [provider]  ?lisinopril (PRINIVIL,ZESTRIL) 20 MG tablet Take 20 mg by mouth daily.   Yes [provider]  ?metFORMIN (GLUCOPHAGE) 500 MG tablet Take 1,000 mg by mouth 2 (two) times daily with a meal. Takes 2 tablets twice a day.    Yes [provider]  ?metoprolol succinate (TOPROL-XL) 50 MG 24 hr tablet Take 50 mg by mouth daily. Take with or immediately following a meal.   Yes [provider]  ?Multiple Vitamin (MULTIVITAMIN) LIQD Take 5 mLs by mouth daily.   Yes [provider]  ?ondansetron (ZOFRAN-ODT) 4 MG disintegrating tablet Take 1 tablet (4 mg total) by mouth every 8 (eight) hours as needed for nausea or vomiting. 08/09/21  Yes Isla Pence, MD  ?pantoprazole (PROTONIX) 40 MG tablet Take 1 tablet (40 mg total) by mouth daily. ?Patient taking differently: Take 40 mg by mouth 2 (two) times daily. 08/09/21  Yes Isla Pence, MD  ?sertraline (ZOLOFT) 100 MG tablet Take 200 mg by mouth daily.  11/22/12  Yes [provider]  ?simvastatin (ZOCOR) 40 MG tablet Take 40 mg by mouth every evening.   Yes [provider]  ?sucralfate (CARAFATE) 1 g tablet Take 1 g by mouth 2 (two) times daily.   Yes [provider]  ?HYDROcodone-acetaminophen (NORCO/VICODIN) 5-325 MG tablet Take 1 tablet by mouth every 4 (four) hours as needed. ?Patient not taking: Reported on 08/21/2021 08/09/21   Isla Pence, MD  ? ? ?Physical Exam: ?Vitals:  ? 08/21/21 0615 08/21/21 0730 08/21/21 0830 08/21/21 0845  ?BP: (!) 174/76 (!) 170/80 118/79 135/77  ?Pulse: 67 74 73 71  ?Resp: 20 18 16 20   ?Temp:      ?TempSrc:      ?SpO2: 97% 100% 97% 100%  ? ?General: 61 y.o. female resting in bed in NAD ?Eyes: PERRL, normal sclera ?ENMT: Nares patent w/o discharge, orophaynx clear, dentition normal, ears w/o discharge/lesions/ulcers ?Neck: Supple, trachea  midline ?Cardiovascular: RRR, +S1, S2, no m/g/r, equal pulses throughout, she has some reproducible CP but not as sharp as when her epigastrium is palpated  ?Respiratory: CTABL, no w/r/r, normal WOB ?GI: BS+, ND, obese, TTP epigastric, no masses noted, no organomegaly noted ?MSK: No e/c/c ?Neuro: A&O x 3, no focal deficits ?Psyc: Appropriate interaction and affect, calm/cooperative ? ?Data Reviewed: ? ?Na+  138 ?K+  4.2 ?BUN  18 ?Scr  0.79 ?Trp  69 -> 3 ?WBC  6.2 ?Hgb  10.7 ?Plt  404 ? ?UA: positive ? ?EKG: sinus, no st elevations ? ?CXR:  ?1. Low lung volumes without radiographic evidence of acute cardiopulmonary disease. ? ?CT ab/pelvis w/ ?1. Gas is present in the lumen  of the urinary bladder. This could be iatrogenic in the setting of recent catheterization for urinalysis. If there has been no recent catheterization, however, correlation with urinalysis would be recommended, as the possibility of urinary tract infection with gas-forming organisms is not excluded. ?2. No other potential acute findings noted elsewhere in the abdomen or pelvis to account for the patient's symptoms. ?3. Severe hepatic steatosis. ?4. 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 ?5. Mild aortic atherosclerosis. ? ?CTA chest ?1. No acute findings are noted in the thorax to account for the patient's symptoms. Specifically, no evidence of acute aortic syndrome. ?2. Tiny 2-3 mm pulmonary nodules in the lungs bilaterally, nonspecific, but statistically likely benign. No follow-up needed if patient is low-risk (and has no known or suspected primary neoplasm). Non-contrast chest CT can be considered in 12 months if patient is high-risk. This recommendation follows the consensus statement: Guidelines for Management of Incidental Pulmonary Nodules Detected on CT Images: From the Fleischner Society 2017; Radiology 2017; 284:228-243. ?3. Aortic atherosclerosis (mild). ?4. Severe hepatic  steatosis. ? ?Assessment and Plan: ?Chest pain ?Epigastric abdominal pain ?    - place in obs, tele ?    - trp has trended down ?    - EKG as above ?    - her pain seems more intense in the epigastric area than it does mid

## 2021-08-21 NOTE — Progress Notes (Signed)
Pt pending CT [facility] tomorrow 08/22/21 at 9:00a.  ? ?Pt will need an 18g in the [facility] for contrast ?100mg  metoprolol tartrate @7 :00a for HR control (goal to scan 55-65bpm) ?Hold caffeine, no food 4 hr prior to scan ? ?[facility] to arrive by 8:30a for pickup.  ?(Round trip- patient will return to [facility]) ? ?Please let me know if  you have questions ? ? RN Navigator Cardiac Imaging ?[facility] ?[phone] Office  ?[phone] cell ? ?

## 2021-08-21 NOTE — ED Notes (Signed)
With CT 

## 2021-08-21 NOTE — ED Notes (Signed)
Back from Xray.

## 2021-08-21 NOTE — ED Provider Notes (Addendum)
?  Physical Exam  ?BP 118/79   Pulse 73   Temp 98 ?F (36.7 ?C) (Oral)   Resp 16   SpO2 97%  ? ?Physical Exam ? ?Procedures  ?Procedures ? ?ED Course / MDM  ?  ?Medical Decision Making ?Amount and/or Complexity of Data Reviewed ?Labs: ordered. ?Radiology: ordered. ? ?Risk ?Prescription drug management. ?Decision regarding hospitalization. ? ? ?Patient signed out to me at shift change.  Please see previous provider note for full details. ? ?In short, this is a 61 year old female with medical history of bowel obstruction, ventral hernia, hypertension, diabetes, hyperlipidemia, and acid reflux who presents for concern of upper abdominal pain and lower chest pain which is burning in nature that is been ongoing for 3 weeks but became acutely worse this morning waking her up from her sleep with 10 out of 10 pain.  Patient was seen for the same complaint on 5/7, had unremarkable work-up, referred to GI.  At GI, patient had work-up to include barium swallow which was unrevealing.  Patient states that GI provider referred her to cardiology suspecting that her chest discomfort might be cardiac in nature. ? ?Here in the department, the patient's initial troponin was 69 and she had an unremarkable CT abdomen pelvis.  This raised concern for dissection.  Patient's CT angiogram was unrevealing of any type of dissection.  Patient's second troponin decreased to 3.  Initial troponin was 69.  Patient also had subtle T wave abnormalities noted on EKG. ? ?On reassessment, patient was complaining of increased chest pain around 8 out of 10.  Repeat dose of morphine was given.  Patient states that chest pain has abated. ? ?Cardiology was consulted, Dr. Wynetta Emery has agreed to consult on the patient and wishes to have her admitted for CT coronary as well as stress test.  Dr. Marylyn Ishihara, hospitalist team, was consulted for admission.  Dr. Marylyn Ishihara has agreed to admit the patient and is aware of the plan. ? ?The patient is amenable to the plan.  The  patient is stable at time of admission. ? ? ? ? ?  ? ? ?  ?Azucena Cecil, PA-C ?08/21/21 [id number] ? ?  ?Milton Ferguson, MD ?08/21/21 1657 ? ?

## 2021-08-21 NOTE — Consult Note (Addendum)
Referring Provider: TR[facility] ?Primary Care Physician:  Seward Carol, MD ?Primary Gastroenterologist:  [facility] ? ?Reason for Consultation:  epigastric abdominal pain ? ?HPI: Lauren Schmidt is a 61 y.o. female  with medical history significant of HTN, HLD, DM2, morbid obesity. Presenting with epigastric abdominal pain and midsternal chest pain. ? ?Patient reports starting 3 weeks ago after walking half a mile to her car she began having epigastric/chest pain she went to go sit down.  Pain has been relatively constant since that time.  Reports the pain is burning and will intensify at times.  Pain is not worsened with eating, drinking, bowel movements.   ? ?She previously presented to the ED on 08/09/2021 where she had a negative work-up and improved on GI cocktail and pain meds.  Patient followed up with [facility] at which point she admitted to having dysphagia to solids and liquids and was recommended barium swallow which was unrevealing other than a small hiatal hernia.  Patient was scheduled for hospital EGD for further evaluation of epigastric pain and colonoscopy for screening purposes.  ? ?Today patient states that her pain has not improved with GI cocktail in the ED.  Her pain did not improve with Protonix and Tums at home.  She notes she has been short of breath whenever her pain is severe.  Patient denies NSAID use, but review of [facility] note by Minda Ditto, PA-C notes the patient had been taking twice daily prescription NSAIDs older pain but stopped when her epigastric pain began. ? ?Patient denies previous EGD.  Previous colonoscopy 2012 was normal recommended repeat in 10 years. ? ?Patient denies alcohol use, smoking, IV drug use. ? ?She is not taking any blood thinning medication. ? ? ?Past Medical History:  ?Diagnosis Date  ? Anemia   ? Anxiety   ? Depression   ? Diabetes mellitus without complication ([place])   ? type 2  ? Family history of adverse reaction to anesthesia   ? sister's bp dropped low during  surgery  ? Fatty liver   ? GERD (gastroesophageal reflux disease)   ? History of hiatal hernia   ? Hypercholesterolemia   ? Hypertension   ? Peripheral neuropathy   ? feet  ? Sleep apnea   ? uses cpap  ? Trigger thumb of both thumbs   ? Umbilical hernia with gangrene and obstruction 01/31/2013  ? Repaired on 01/31/13. SB resection done   ? ? ?Past Surgical History:  ?Procedure Laterality Date  ? BOWEL RESECTION N/A 01/31/2013  ? Procedure: SMALL BOWEL RESECTION;  Surgeon: Merrie Roof, MD;  Location: [place];  Service: General;  Laterality: N/A;  ? COLON SURGERY    ? COLONOSCOPY    ? HERNIA REPAIR    ? INSERTION OF MESH N/A 07/01/2016  ? Procedure: INSERTION OF MESH;  Surgeon: Autumn Messing III, MD;  Location: [place];  Service: General;  Laterality: N/A;  ? KNEE ARTHROSCOPY Right 1990s?  ? [place]  07/01/2016  ? Right Knee arthroscopy    ? TRIGGER FINGER RELEASE Right 10/24/2016  ? Procedure: RELEASE TRIGGER FINGER/A-1 PULLEY RIGHT THUMB TRIGGER RELEASE;  Surgeon: Leanora Cover, MD;  Location: [place];  Service: Orthopedics;  Laterality: Right;  ? UMBILICAL HERNIA REPAIR N/A 01/31/2013  ? Procedure: HERNIA REPAIR UMBILICAL ADULT;  Surgeon: Merrie Roof, MD;  Location: [place];  Service: General;  Laterality: N/A;  ? VENTRAL HERNIA REPAIR N/A 07/01/2016  ? Procedure: LAPROSCOPIC  ASSISTED VENTRAL HERNIA REPAIR;  Surgeon: Autumn Messing III, MD;  Location: [place];  Service: General;  Laterality: N/A;  ? ? ?Prior to Admission medications   ?Medication Sig Start Date End Date Taking? Authorizing Provider  ?ALPRAZolam (XANAX) 0.5 MG tablet Take 0.5 mg by mouth at bedtime as needed for sleep or anxiety.   Yes [provider]  ?dapagliflozin propanediol (FARXIGA) 5 MG TABS tablet Take 5 mg by mouth at bedtime.   Yes [provider]  ?fenofibrate (TRICOR) 145 MG tablet Take 145 mg by mouth daily.   Yes [provider]  ?glimepiride (AMARYL) 4 MG tablet Take  4 mg by mouth daily with breakfast.   Yes [provider]  ?lisinopril (PRINIVIL,ZESTRIL) 20 MG tablet Take 20 mg by mouth daily.   Yes [provider]  ?metFORMIN (GLUCOPHAGE) 500 MG tablet Take 1,000 mg by mouth 2 (two) times daily with a meal. Takes 2 tablets twice a day.    Yes [provider]  ?metoprolol succinate (TOPROL-XL) 50 MG 24 hr tablet Take 50 mg by mouth daily. Take with or immediately following a meal.   Yes [provider]  ?Multiple Vitamin (MULTIVITAMIN) LIQD Take 5 mLs by mouth daily.   Yes [provider]  ?ondansetron (ZOFRAN-ODT) 4 MG disintegrating tablet Take 1 tablet (4 mg total) by mouth every 8 (eight) hours as needed for nausea or vomiting. 08/09/21  Yes Isla Pence, MD  ?pantoprazole (PROTONIX) 40 MG tablet Take 1 tablet (40 mg total) by mouth daily. ?Patient taking differently: Take 40 mg by mouth 2 (two) times daily. 08/09/21  Yes Isla Pence, MD  ?sertraline (ZOLOFT) 100 MG tablet Take 200 mg by mouth daily.  11/22/12  Yes [provider]  ?simvastatin (ZOCOR) 40 MG tablet Take 40 mg by mouth every evening.   Yes [provider]  ?sucralfate (CARAFATE) 1 g tablet Take 1 g by mouth 2 (two) times daily.   Yes [provider]  ?HYDROcodone-acetaminophen (NORCO/VICODIN) 5-325 MG tablet Take 1 tablet by mouth every 4 (four) hours as needed. ?Patient not taking: Reported on 08/21/2021 08/09/21   Isla Pence, MD  ? ? ?Scheduled Meds: ? enoxaparin (LOVENOX) injection  40 mg Subcutaneous Q24H  ? [START ON 08/22/2021] metoprolol tartrate  100 mg Oral Once  ? ?Continuous Infusions: ? sodium chloride 50 mL/hr at 08/21/21 1230  ? ?PRN Meds:.acetaminophen, ALPRAZolam, ondansetron (ZOFRAN) IV, perflutren lipid microspheres (DEFINITY) IV suspension ? ?Allergies as of 08/21/2021 - Review Complete 08/21/2021  ?Allergen Reaction Noted  ? Nitrofurantoin macrocrystal Shortness Of Breath, Itching, Swelling, and Anaphylaxis  08/29/2016  ? Tape Other (See Comments) 05/14/2016  ? Shellfish allergy Rash 05/14/2016  ? ? ?Family History  ?Problem Relation Age of Onset  ? Heart failure Mother   ? Aneurysm Mother   ? Diabetes Mother   ? ? ?Social History  ? ?Socioeconomic History  ? Marital status: Single  ?  Spouse name: Not on file  ? Number of children: Not on file  ? Years of education: Not on file  ? Highest education level: Not on file  ?Occupational History  ? Not on file  ?Tobacco Use  ? Smoking status: Former  ? Smokeless tobacco: Never  ? Tobacco comments:  ?  Quit smoking 30 years ago  ?Vaping Use  ? Vaping Use: Never used  ?Substance and Sexual Activity  ? Alcohol use: No  ? Drug use: No  ? Sexual activity: Yes  ?Other Topics Concern  ?  Not on file  ?Social History Narrative  ? Not on file  ? ?Social Determinants of Health  ? ?Financial Resource Strain: Not on file  ?Food Insecurity: Not on file  ?Transportation Needs: Not on file  ?Physical Activity: Not on file  ?Stress: Not on file  ?Social Connections: Not on file  ?Intimate Partner Violence: Not on file  ? ? ?Review of Systems: Review of Systems  ?Constitutional:  Positive for malaise/fatigue. Negative for chills and fever.  ?HENT:  Negative for hearing loss and tinnitus.   ?Eyes:  Negative for blurred vision and double vision.  ?Respiratory:  Positive for shortness of breath. Negative for cough and hemoptysis.   ?Cardiovascular:  Positive for chest pain. Negative for palpitations.  ?Gastrointestinal:  Positive for abdominal pain. Negative for blood in stool, constipation, diarrhea, heartburn, melena, nausea and vomiting.  ?Genitourinary:  Negative for dysuria and urgency.  ?Musculoskeletal:  Negative for myalgias and neck pain.  ?Skin:  Negative for itching and rash.  ?Neurological:  Positive for weakness. Negative for dizziness and headaches.  ?Endo/Heme/Allergies:  Negative for environmental allergies. Does not bruise/bleed easily.  ?Psychiatric/Behavioral:  Negative for  depression, substance abuse and suicidal ideas.   ? ? ?Physical Exam:Physical Exam ?Constitutional:   ?   General: She is not in acute distress. ?   Appearance: Normal appearance. She is obese.  ?HENT:  ?   [place]

## 2021-08-21 NOTE — Consult Note (Addendum)
?Cardiology Consultation:  ? ?Patient ID: Lauren Schmidt ?MRN: [id number]; DOB: Mar 06, 1961 ? ?Admit date: 08/21/2021 ?Date of Consult: 08/21/2021 ? ?PCP:  Seward Carol, MD ?  ?[facility] Providers ?Cardiologist:  None    ?Has appt w/ Dr Phineas Inches 05/17 ? ?Patient Profile:  ? ?Lauren Schmidt is a 61 y.o. female with a hx of DM2, HTN, HLD, GERD, bowel obs, morbid obesity, who is being seen 08/21/2021 for the evaluation of chest pain, at the request of Dr Marylyn Ishihara. ? ?History of Present Illness:  ? ?Lauren Schmidt was seen in the ER on 05/04 for abdominal pain and constipation.  History of bowel obstruction.  Symptoms started after using etodolac. PT given a gi cocktail, pepcid, morphine, dilaudid and ativan and finally felt better.  O2 sat dropped briefly after the dilaudid, but normalized.  Prilosec was changed to Protonix and she was asked to follow-up with GI. ? ?She was admitted 5/16 for chest and abdominal pain.  Initial troponin was 69, repeat was 3, cardiology asked to evaluate her. ? ?Lauren Schmidt's chest pain started 2-1/2 weeks ago.  She was going to an event [facility] the cancer center and had to walk a long way.  She states it was 1500 steps, possibly half mile or more. ? ?She had no chest pain or shortness of breath during the walk, but later is when the pain started.  It is a burning pain, in the lower sternum and upper mid epigastric area.  It has reached a 10/10 multiple times.  Since it started on 4/27, the pain has not been a 0.  It has generally been greater than 5/10. ? ?There was no worsening with activity, no change with deep inspiration or position.  She did not feel short of breath with the pain. ? ?She took various medications, mostly GI related with no relief.  She did not take anything that made it better or worse. ? ?She saw her PCP who sent her to GI.  She states that neither one of them could tell her cause for the pain and they were not effective at treating it. ? ?An appointment was made with  cardiology, she is to see Dr. Phineas Inches tomorrow. ? ?She has never seen cardiology before.  She has not had any kind of cardiology evaluation. ? ?She has no history of significant dyspnea on exertion and no history of exertional chest pain. ? ?She is currently still experiencing the pain, it is a 6/10 now because she has had some morphine. ? ? ?Past Medical History:  ?Diagnosis Date  ? Anemia   ? Anxiety   ? Depression   ? Diabetes mellitus without complication ([place])   ? type 2  ? Family history of adverse reaction to anesthesia   ? sister's bp dropped low during surgery  ? Fatty liver   ? GERD (gastroesophageal reflux disease)   ? History of hiatal hernia   ? Hypercholesterolemia   ? Hypertension   ? Peripheral neuropathy   ? feet  ? Sleep apnea   ? uses cpap  ? Trigger thumb of both thumbs   ? Umbilical hernia with gangrene and obstruction 01/31/2013  ? Repaired on 01/31/13. SB resection done   ? ? ?Past Surgical History:  ?Procedure Laterality Date  ? BOWEL RESECTION N/A 01/31/2013  ? Procedure: SMALL BOWEL RESECTION;  Surgeon: Merrie Roof, MD;  Location: [facility];  Service: General;  Laterality: N/A;  ? COLON SURGERY    ? COLONOSCOPY    ?  HERNIA REPAIR    ? INSERTION OF MESH N/A 07/01/2016  ? Procedure: INSERTION OF MESH;  Surgeon: Chevis Pretty III, MD;  Location: [facility] OR;  Service: General;  Laterality: N/A;  ? KNEE ARTHROSCOPY Right 1990s?  ? LAPAROSCOPIC ASSISTED VENTRAL HERNIA REPAIR  07/01/2016  ? Right Knee arthroscopy    ? TRIGGER FINGER RELEASE Right 10/24/2016  ? Procedure: RELEASE TRIGGER FINGER/A-1 PULLEY RIGHT THUMB TRIGGER RELEASE;  Surgeon: Betha Loa, MD;  Location: [facility];  Service: Orthopedics;  Laterality: Right;  ? UMBILICAL HERNIA REPAIR N/A 01/31/2013  ? Procedure: HERNIA REPAIR UMBILICAL ADULT;  Surgeon: Robyne Askew, MD;  Location: [facility];  Service: General;  Laterality: N/A;  ? VENTRAL HERNIA REPAIR N/A 07/01/2016  ? Procedure: LAPROSCOPIC ASSISTED VENTRAL HERNIA REPAIR;   Surgeon: Chevis Pretty III, MD;  Location: [facility] OR;  Service: General;  Laterality: N/A;  ?  ? ?Home Medications:  ?Prior to Admission medications   ?Medication Sig Start Date End Date Taking? Authorizing Provider  ?ALPRAZolam (XANAX) 0.5 MG tablet Take 0.5 mg by mouth at bedtime as needed for sleep or anxiety.   Yes [provider]  ?dapagliflozin propanediol (FARXIGA) 5 MG TABS tablet Take 5 mg by mouth at bedtime.   Yes [provider]  ?fenofibrate (TRICOR) 145 MG tablet Take 145 mg by mouth daily.   Yes [provider]  ?glimepiride (AMARYL) 4 MG tablet Take 4 mg by mouth daily with breakfast.   Yes [provider]  ?lisinopril (PRINIVIL,ZESTRIL) 20 MG tablet Take 20 mg by mouth daily.   Yes [provider]  ?metFORMIN (GLUCOPHAGE) 500 MG tablet Take 1,000 mg by mouth 2 (two) times daily with a meal. Takes 2 tablets twice a day.    Yes [provider]  ?metoprolol succinate (TOPROL-XL) 50 MG 24 hr tablet Take 50 mg by mouth daily. Take with or immediately following a meal.   Yes [provider]  ?Multiple Vitamin (MULTIVITAMIN) LIQD Take 5 mLs by mouth daily.   Yes [provider]  ?ondansetron (ZOFRAN-ODT) 4 MG disintegrating tablet Take 1 tablet (4 mg total) by mouth every 8 (eight) hours as needed for nausea or vomiting. 08/09/21  Yes Jacalyn Lefevre, MD  ?pantoprazole (PROTONIX) 40 MG tablet Take 1 tablet (40 mg total) by mouth daily. ?Patient taking differently: Take 40 mg by mouth 2 (two) times daily. 08/09/21  Yes Jacalyn Lefevre, MD  ?sertraline (ZOLOFT) 100 MG tablet Take 200 mg by mouth daily.  11/22/12  Yes [provider]  ?simvastatin (ZOCOR) 40 MG tablet Take 40 mg by mouth every evening.   Yes [provider]  ?sucralfate (CARAFATE) 1 g tablet Take 1 g by mouth 2 (two) times daily.   Yes [provider]  ?HYDROcodone-acetaminophen (NORCO/VICODIN) 5-325 MG tablet Take 1 tablet by mouth every 4 (four) hours as  needed. ?Patient not taking: Reported on 08/21/2021 08/09/21   Jacalyn Lefevre, MD  ? ? ?Inpatient Medications: ?Scheduled Meds: ? alum & mag hydroxide-simeth  30 mL Oral Once  ? And  ? lidocaine  15 mL Oral Once  ? enoxaparin (LOVENOX) injection  40 mg Subcutaneous Q24H  ? ?Continuous Infusions: ? ?PRN Meds: ? ? ?Allergies:    ?Allergies  ?Allergen Reactions  ? Nitrofurantoin Macrocrystal Shortness Of Breath, Itching, Swelling and Anaphylaxis  ? Tape Other (See Comments)  ?  Leave red marks and welts  ? Shellfish Allergy Rash  ?  Rash   ? ? ?Social  History:   ?Social History  ? ?Socioeconomic History  ? Marital status: Single  ?  Spouse name: Not on file  ? Number of children: Not on file  ? Years of education: Not on file  ? Highest education level: Not on file  ?Occupational History  ? Not on file  ?Tobacco Use  ? Smoking status: Former  ? Smokeless tobacco: Never  ? Tobacco comments:  ?  Quit smoking 30 years ago  ?Vaping Use  ? Vaping Use: Never used  ?Substance and Sexual Activity  ? Alcohol use: No  ? Drug use: No  ? Sexual activity: Yes  ?Other Topics Concern  ? Not on file  ?Social History Narrative  ? Not on file  ? ?Social Determinants of Health  ? ?Financial Resource Strain: Not on file  ?Food Insecurity: Not on file  ?Transportation Needs: Not on file  ?Physical Activity: Not on file  ?Stress: Not on file  ?Social Connections: Not on file  ?Intimate Partner Violence: Not on file  ?  ?Family History:   ?Family History  ?Problem Relation Age of Onset  ? Heart failure Mother   ? Aneurysm Mother   ? Diabetes Mother   ?  ? ?ROS:  ?Please see the history of present illness.  ?All other ROS reviewed and negative.    ? ?Physical Exam/Data:  ? ?Vitals:  ? 08/21/21 0730 08/21/21 0830 08/21/21 0845 08/21/21 0930  ?BP: (!) 170/80 118/79 135/77 (!) 149/67  ?Pulse: 74 73 71 73  ?Resp: 18 16 20 13   ?Temp:      ?TempSrc:      ?SpO2: 100% 97% 100% 95%  ? ?No intake or output data in the 24 hours ending 08/21/21 1013 ? ?   08/09/2021  ?  6:04 AM 03/31/2018  ?  8:25 AM 10/24/2016  ? 10:23 AM  ?Last 3 Weights  ?Weight (lbs) 260 lb 250 lb 250 lb  ?Weight (kg) 117.935 kg 113.399 kg 113.399 kg  ?   ?There is no height or weight on file t

## 2021-08-21 NOTE — ED Notes (Signed)
Pt ambulated to and from bathroom at this time.

## 2021-08-21 NOTE — Progress Notes (Signed)
Echocardiogram ?2D Echocardiogram has been performed. ? ?Alim Cattell  Veva Holes ?08/21/2021, 12:23 PM ?

## 2021-08-21 NOTE — H&P (View-Only) (Signed)
Referring Provider: [facility] Primary Care Physician:  Seward Carol, MD Primary Gastroenterologist:  [facility]  Reason for Consultation:  epigastric abdominal pain  HPI: Lauren Schmidt is a 61 y.o. female  with medical history significant of HTN, HLD, DM2, morbid obesity. Presenting with epigastric abdominal pain and midsternal chest pain.  Patient reports starting 3 weeks ago after walking half a mile to her car she began having epigastric/chest pain she went to go sit down.  Pain has been relatively constant since that time.  Reports the pain is burning and will intensify at times.  Pain is not worsened with eating, drinking, bowel movements.    She previously presented to the ED on 08/09/2021 where she had a negative work-up and improved on GI cocktail and pain meds.  Patient followed up with [facility] at which point she admitted to having dysphagia to solids and liquids and was recommended barium swallow which was unrevealing other than a small hiatal hernia.  Patient was scheduled for hospital EGD for further evaluation of epigastric pain and colonoscopy for screening purposes.   Today patient states that her pain has not improved with GI cocktail in the ED.  Her pain did not improve with Protonix and Tums at home.  She notes she has been short of breath whenever her pain is severe.  Patient denies NSAID use, but review of [facility] note by Minda Ditto, PA-C notes the patient had been taking twice daily prescription NSAIDs older pain but stopped when her epigastric pain began.  Patient denies previous EGD.  Previous colonoscopy 2012 was normal recommended repeat in 10 years.  Patient denies alcohol use, smoking, IV drug use.  She is not taking any blood thinning medication.   Past Medical History:  Diagnosis Date   Anemia    Anxiety    Depression    Diabetes mellitus without complication ([place])    type 2   Family history of adverse reaction to anesthesia    sister's bp dropped low during  surgery   Fatty liver    GERD (gastroesophageal reflux disease)    History of hiatal hernia    Hypercholesterolemia    Hypertension    Peripheral neuropathy    feet   Sleep apnea    uses cpap   Trigger thumb of both thumbs    Umbilical hernia with gangrene and obstruction 01/31/2013   Repaired on 01/31/13. SB resection done     Past Surgical History:  Procedure Laterality Date   BOWEL RESECTION N/A 01/31/2013   Procedure: SMALL BOWEL RESECTION;  Surgeon: Merrie Roof, MD;  Location: [place];  Service: General;  Laterality: N/A;   COLON SURGERY     COLONOSCOPY     HERNIA REPAIR     INSERTION OF MESH N/A 07/01/2016   Procedure: INSERTION OF MESH;  Surgeon: Autumn Messing III, MD;  Location: [place];  Service: General;  Laterality: N/A;   KNEE ARTHROSCOPY Right 1990s?   LAPAROSCOPIC ASSISTED VENTRAL HERNIA REPAIR  07/01/2016   Right Knee arthroscopy     TRIGGER FINGER RELEASE Right 10/24/2016   Procedure: RELEASE TRIGGER FINGER/A-1 PULLEY RIGHT THUMB TRIGGER RELEASE;  Surgeon: Leanora Cover, MD;  Location: [place];  Service: Orthopedics;  Laterality: Right;   UMBILICAL HERNIA REPAIR N/A 01/31/2013   Procedure: HERNIA REPAIR UMBILICAL ADULT;  Surgeon: Merrie Roof, MD;  Location: [place];  Service: General;  Laterality: N/A;   VENTRAL HERNIA REPAIR N/A 07/01/2016   Procedure: LAPROSCOPIC  ASSISTED VENTRAL HERNIA REPAIR;  Surgeon: Autumn Messing III, MD;  Location: [place];  Service: General;  Laterality: N/A;    Prior to Admission medications   Medication Sig Start Date End Date Taking? Authorizing Provider  ALPRAZolam Duanne Moron) 0.5 MG tablet Take 0.5 mg by mouth at bedtime as needed for sleep or anxiety.   Yes [provider]  dapagliflozin propanediol (FARXIGA) 5 MG TABS tablet Take 5 mg by mouth at bedtime.   Yes [provider]  fenofibrate (TRICOR) 145 MG tablet Take 145 mg by mouth daily.   Yes [provider]  glimepiride (AMARYL) 4 MG tablet Take  4 mg by mouth daily with breakfast.   Yes [provider]  lisinopril (PRINIVIL,ZESTRIL) 20 MG tablet Take 20 mg by mouth daily.   Yes [provider]  metFORMIN (GLUCOPHAGE) 500 MG tablet Take 1,000 mg by mouth 2 (two) times daily with a meal. Takes 2 tablets twice a day.    Yes [provider]  metoprolol succinate (TOPROL-XL) 50 MG 24 hr tablet Take 50 mg by mouth daily. Take with or immediately following a meal.   Yes [provider]  Multiple Vitamin (MULTIVITAMIN) LIQD Take 5 mLs by mouth daily.   Yes [provider]  ondansetron (ZOFRAN-ODT) 4 MG disintegrating tablet Take 1 tablet (4 mg total) by mouth every 8 (eight) hours as needed for nausea or vomiting. 08/09/21  Yes Isla Pence, MD  pantoprazole (PROTONIX) 40 MG tablet Take 1 tablet (40 mg total) by mouth daily. Patient taking differently: Take 40 mg by mouth 2 (two) times daily. 08/09/21  Yes Isla Pence, MD  sertraline (ZOLOFT) 100 MG tablet Take 200 mg by mouth daily.  11/22/12  Yes [provider]  simvastatin (ZOCOR) 40 MG tablet Take 40 mg by mouth every evening.   Yes [provider]  sucralfate (CARAFATE) 1 g tablet Take 1 g by mouth 2 (two) times daily.   Yes [provider]  HYDROcodone-acetaminophen (NORCO/VICODIN) 5-325 MG tablet Take 1 tablet by mouth every 4 (four) hours as needed. Patient not taking: Reported on 08/21/2021 08/09/21   Isla Pence, MD    Scheduled Meds:  enoxaparin (LOVENOX) injection  40 mg Subcutaneous Q24H   [START ON 08/22/2021] metoprolol tartrate  100 mg Oral Once   Continuous Infusions:  sodium chloride 50 mL/hr at 08/21/21 1230   PRN Meds:.acetaminophen, ALPRAZolam, ondansetron (ZOFRAN) IV, perflutren lipid microspheres (DEFINITY) IV suspension  Allergies as of 08/21/2021 - Review Complete 08/21/2021  Allergen Reaction Noted   Nitrofurantoin macrocrystal Shortness Of Breath, Itching, Swelling, and Anaphylaxis  08/29/2016   Tape Other (See Comments) 05/14/2016   Shellfish allergy Rash 05/14/2016    Family History  Problem Relation Age of Onset   Heart failure Mother    Aneurysm Mother    Diabetes Mother     Social History   Socioeconomic History   Marital status: Single    Spouse name: Not on file   Number of children: Not on file   Years of education: Not on file   Highest education level: Not on file  Occupational History   Not on file  Tobacco Use   Smoking status: Former   Smokeless tobacco: Never   Tobacco comments:    Quit smoking 30 years ago  Vaping Use   Vaping Use: Never used  Substance and Sexual Activity   Alcohol use: No   Drug use: No   Sexual activity: Yes  Other Topics Concern  Not on file  Social History Narrative   Not on file   Social Determinants of Health   Financial Resource Strain: Not on file  Food Insecurity: Not on file  Transportation Needs: Not on file  Physical Activity: Not on file  Stress: Not on file  Social Connections: Not on file  Intimate Partner Violence: Not on file    Review of Systems: Review of Systems  Constitutional:  Positive for malaise/fatigue. Negative for chills and fever.  HENT:  Negative for hearing loss and tinnitus.   Eyes:  Negative for blurred vision and double vision.  Respiratory:  Positive for shortness of breath. Negative for cough and hemoptysis.   Cardiovascular:  Positive for chest pain. Negative for palpitations.  Gastrointestinal:  Positive for abdominal pain. Negative for blood in stool, constipation, diarrhea, heartburn, melena, nausea and vomiting.  Genitourinary:  Negative for dysuria and urgency.  Musculoskeletal:  Negative for myalgias and neck pain.  Skin:  Negative for itching and rash.  Neurological:  Positive for weakness. Negative for dizziness and headaches.  Endo/Heme/Allergies:  Negative for environmental allergies. Does not bruise/bleed easily.  Psychiatric/Behavioral:  Negative for  depression, substance abuse and suicidal ideas.     Physical Exam:Physical Exam Constitutional:      General: She is not in acute distress.    Appearance: Normal appearance. She is obese.  HENT:     Head: Normocephalic and atraumatic.     Right Ear: External ear normal.     Left Ear: External ear normal.     Nose: Nose normal.     Mouth/Throat:     Mouth: Mucous membranes are moist.  Eyes:     Pupils: Pupils are equal, round, and reactive to light.  Cardiovascular:     Rate and Rhythm: Normal rate and regular rhythm.     Pulses: Normal pulses.     Heart sounds: Normal heart sounds.  Pulmonary:     Effort: Pulmonary effort is normal.     Breath sounds: Normal breath sounds.  Abdominal:     General: Abdomen is flat. Bowel sounds are normal. There is no distension.     Palpations: Abdomen is soft. There is no mass.     Tenderness: There is abdominal tenderness (epigastric). There is no guarding or rebound.     Hernia: No hernia is present.  Musculoskeletal:        General: No swelling. Normal range of motion.     Cervical back: Normal range of motion and neck supple.  Skin:    General: Skin is warm and dry.  Neurological:     General: No focal deficit present.     Mental Status: She is alert and oriented to person, place, and time. Mental status is at baseline.  Psychiatric:        Mood and Affect: Mood normal.        Behavior: Behavior normal.    Vital signs: Vitals:   08/21/21 1030 08/21/21 1100  BP: (!) 143/67 (!) 153/63  Pulse: 73 71  Resp: 16 (!) 21  Temp:    SpO2: 96% 98%        GI:  Lab Results: Recent Labs    08/21/21 0523 08/21/21 0951  WBC 6.2 5.3  HGB 10.7* 10.1*  HCT 36.0 32.0*  PLT 404* 360   BMET Recent Labs    08/21/21 0523 08/21/21 0951  NA 138  --   K 4.2  --   CL 103  --  CO2 26  --   GLUCOSE 99  --   BUN 18  --   CREATININE 0.79 0.75  CALCIUM 9.3  --    LFT Recent Labs    08/21/21 0523  PROT 7.9  ALBUMIN 4.1  AST 35   ALT 25  ALKPHOS 33*  BILITOT 0.7   PT/INR No results for input(s): LABPROT, INR in the last 72 hours.   Studies/Results: DG Chest 2 View  Result Date: 08/21/2021 CLINICAL DATA:  61 year old female with history of chest pain and upper abdominal pain intermittently for the past 3 weeks. EXAM: CHEST - 2 VIEW COMPARISON:  Chest x-ray 08/27/2011. FINDINGS: Lung volumes are low. No consolidative airspace disease. No pleural effusions. No pneumothorax. No pulmonary nodule or mass noted. Pulmonary vasculature and the cardiomediastinal silhouette are within normal limits. IMPRESSION: 1. Low lung volumes without radiographic evidence of acute cardiopulmonary disease. Electronically Signed   By: Vinnie Langton M.D.   On: 08/21/2021 05:52   CT ABDOMEN PELVIS W CONTRAST  Result Date: 08/21/2021 CLINICAL DATA:  61 year old female with history of chest and upper abdominal pain for the past 3 weeks, worsening this morning. EXAM: CT ABDOMEN AND PELVIS WITH CONTRAST TECHNIQUE: Multidetector CT imaging of the abdomen and pelvis was performed using the standard protocol following bolus administration of intravenous contrast. RADIATION DOSE REDUCTION: This exam was performed according to the departmental dose-optimization program which includes automated exposure control, adjustment of the mA and/or kV according to patient size and/or use of iterative reconstruction technique. CONTRAST:  143mL OMNIPAQUE IOHEXOL 300 MG/ML  SOLN COMPARISON:  CT of the abdomen and pelvis 08/09/2021. FINDINGS: Lower chest: Unremarkable. Hepatobiliary: Diffuse low attenuation throughout the hepatic parenchyma, indicative of a background of severe hepatic steatosis. No suspicious cystic or solid hepatic lesions. No intra or extrahepatic biliary ductal dilatation. Gallbladder is normal in appearance. Pancreas: No pancreatic mass. No pancreatic ductal dilatation. No pancreatic or peripancreatic fluid collections or inflammatory changes.  Spleen: Unremarkable. Adrenals/Urinary Tract: In the lower pole of the right kidney there are 2 fat containing lesions, indicative of angiomyolipomas. The largest of these measures 2.3 x 1.4 cm (axial image 48 of series 2). Left kidney and bilateral adrenal glands are normal in appearance. No hydroureteronephrosis. Small amount of gas non dependently in the lumen of the urinary bladder. Urinary bladder is otherwise normal in appearance. Stomach/Bowel: The appearance of the stomach is normal. There is no pathologic dilatation of small bowel or colon. The appendix is not confidently identified and may be surgically absent. Regardless, there are no inflammatory changes noted adjacent to the cecum to suggest the presence of an acute appendicitis at this time. Vascular/Lymphatic: Minimal aortic atherosclerosis, without evidence of aneurysm or dissection in the abdominal or pelvic vasculature. No lymphadenopathy noted in the abdomen or pelvis. Reproductive: Small coarse calcifications in the fundus of the uterus, likely tiny calcified fibroids. Ovaries are unremarkable in appearance. Other: No significant volume of ascites.  No pneumoperitoneum. Musculoskeletal: There are no aggressive appearing lytic or blastic lesions noted in the visualized portions of the skeleton. IMPRESSION: 1. Gas is present in the lumen of the urinary bladder. This could be iatrogenic in the setting of recent catheterization for urinalysis. If there has been no recent catheterization, however, correlation with urinalysis would be recommended, as the possibility of urinary tract infection with gas-forming organisms is not excluded. 2. No other potential acute findings noted elsewhere in the abdomen or pelvis to account for the patient's symptoms. 3. Severe hepatic steatosis. 4.  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 5. Mild aortic atherosclerosis. Electronically Signed   By: Vinnie Langton M.D.   On: 08/21/2021 06:45   CT ANGIO CHEST AORTA W/CM &/OR WO/CM  Result Date: 08/21/2021 CLINICAL DATA:  61 year old female with chest and upper abdominal pain for the past 3 weeks, worsening this morning. Suspected acute aortic syndrome. EXAM: CT ANGIOGRAPHY CHEST WITH CONTRAST TECHNIQUE: Multidetector CT imaging of the chest was performed using the standard protocol during bolus administration of intravenous contrast. Multiplanar CT image reconstructions and MIPs were obtained to evaluate the vascular anatomy. RADIATION DOSE REDUCTION: This exam was performed according to the departmental dose-optimization program which includes automated exposure control, adjustment of the mA and/or kV according to patient size and/or use of iterative reconstruction technique. CONTRAST:  26mL OMNIPAQUE IOHEXOL 350 MG/ML SOLN COMPARISON:  No priors. FINDINGS: Cardiovascular: Precontrast images demonstrate no crescentic high attenuation associated with the wall of the thoracic aorta to suggest the presence of acute intramural hemorrhage. Mild atherosclerosis is noted in the aortic arch. No aneurysm or dissection of the thoracic aorta or the great vessels of the mediastinum. Ascending thoracic aorta, mid arch and descending thoracic aorta measure 2.7 cm, 2.4 cm and 2.5 cm in diameter respectively. No definite coronary artery calcifications. Heart size is normal. There is no significant pericardial fluid, thickening or pericardial calcification. Mediastinum/Nodes: No pathologically enlarged mediastinal or hilar lymph nodes. Esophagus is unremarkable in appearance. No axillary lymphadenopathy. Lungs/Pleura: No pneumothorax. No acute consolidative airspace disease. No pleural effusions. A few scattered tiny 2-3 mm pulmonary nodules are noted in the periphery of the lungs, nonspecific, but statistically likely benign areas of mucoid impaction within terminal bronchioles. No larger more suspicious appearing pulmonary  nodules or masses are noted. Upper Abdomen: Diffuse low attenuation throughout the visualized hepatic parenchyma, indicative of severe hepatic steatosis. Musculoskeletal: There are no aggressive appearing lytic or blastic lesions noted in the visualized portions of the skeleton. Review of the MIP images confirms the above findings. IMPRESSION: 1. No acute findings are noted in the thorax to account for the patient's symptoms. Specifically, no evidence of acute aortic syndrome. 2. Tiny 2-3 mm pulmonary nodules in the lungs bilaterally, nonspecific, but statistically likely benign. No follow-up needed if patient is low-risk (and has no known or suspected primary neoplasm). Non-contrast chest CT can be considered in 12 months if patient is high-risk. This recommendation follows the consensus statement: Guidelines for Management of Incidental Pulmonary Nodules Detected on CT Images: From the Fleischner Society 2017; Radiology 2017; 284:228-243. 3. Aortic atherosclerosis (mild). 4. Severe hepatic steatosis. Aortic Atherosclerosis (ICD10-I70.0). Electronically Signed   By: Vinnie Langton M.D.   On: 08/21/2021 07:28    Impression: Epigastric abdominal pain/chest pain Recent NSAID use Shortness of breath Anemia  Patient with constant and worsening epigastric pain for 3 weeks.  She had frequent NSAID use up until she began noticing her abdominal pain.  Possible gastritis, peptic ulcer disease, esophagitis as a cause of her epigastric pain.  Patient does not seem to be improving with GI cocktail.   Of concern patient also noticed shortness of breath and chest pain.  Event inciting her pain also seem to be 1/2 mile walk.  Concern for possible cardiac etiology of pain.  Patient with hemoglobin of 10.7 on admission.  Last ED visit 08/09/2021 hemoglobin was 10.6.  This seems to be her baseline hemoglobin.  She denies any melena or hematochezia.  I do not suspect active bleeding.   Plan: Consider EGD during this  admission possible Wednesday for evaluation of peptic ulcer disease, gastritis, esophagitis as cause of her abdominal pain after patient has cardiology consult and clearance. Consider IV Protonix 40 mg twice daily. [facility] will follow  LOS: 0 days   Charlott Rakes  PA-C 08/21/2021, 12:45 PM  Contact #  [phone]

## 2021-08-22 ENCOUNTER — Observation Stay (HOSPITAL_COMMUNITY)
Admit: 2021-08-22 | Discharge: 2021-08-22 | Disposition: A | Discharge status: Home / Self Care | Payer: 59 | Attending: Internal Medicine | Admitting: Internal Medicine

## 2021-08-22 ENCOUNTER — Ambulatory Visit: Payer: 59 | Admitting: Internal Medicine

## 2021-08-22 ENCOUNTER — Observation Stay (HOSPITAL_BASED_OUTPATIENT_CLINIC_OR_DEPARTMENT_OTHER)
Admit: 2021-08-22 | Discharge: 2021-08-22 | Disposition: A | Discharge status: Home / Self Care | Payer: 59 | Attending: Internal Medicine | Admitting: Internal Medicine

## 2021-08-22 ENCOUNTER — Observation Stay (HOSPITAL_COMMUNITY): Payer: 59

## 2021-08-22 ENCOUNTER — Other Ambulatory Visit: Payer: Self-pay | Admitting: Internal Medicine

## 2021-08-22 DIAGNOSIS — R931 Abnormal findings on diagnostic imaging of heart and coronary circulation: Secondary | ICD-10-CM

## 2021-08-22 DIAGNOSIS — I259 Chronic ischemic heart disease, unspecified: Secondary | ICD-10-CM

## 2021-08-22 DIAGNOSIS — E782 Mixed hyperlipidemia: Secondary | ICD-10-CM

## 2021-08-22 DIAGNOSIS — I2510 Atherosclerotic heart disease of native coronary artery without angina pectoris: Secondary | ICD-10-CM | POA: Diagnosis not present

## 2021-08-22 DIAGNOSIS — I10 Essential (primary) hypertension: Secondary | ICD-10-CM | POA: Diagnosis not present

## 2021-08-22 DIAGNOSIS — R079 Chest pain, unspecified: Secondary | ICD-10-CM | POA: Diagnosis not present

## 2021-08-22 LAB — CBC
HCT: 35.8 % — ABNORMAL LOW (ref 36.0–46.0)
Hemoglobin: 11 g/dL — ABNORMAL LOW (ref 12.0–15.0)
MCH: 26.4 pg (ref 26.0–34.0)
MCHC: 30.7 g/dL (ref 30.0–36.0)
MCV: 85.9 fL (ref 80.0–100.0)
Platelets: 346 10*3/uL (ref 150–400)
RBC: 4.17 MIL/uL (ref 3.87–5.11)
RDW: 15.1 % (ref 11.5–15.5)
WBC: 4.9 10*3/uL (ref 4.0–10.5)
nRBC: 0 % (ref 0.0–0.2)

## 2021-08-22 LAB — BASIC METABOLIC PANEL
Anion gap: 9 (ref 5–15)
BUN: 12 mg/dL (ref 8–23)
CO2: 26 mmol/L (ref 22–32)
Calcium: 8.8 mg/dL — ABNORMAL LOW (ref 8.9–10.3)
Chloride: 104 mmol/L (ref 98–111)
Creatinine, Ser: 0.8 mg/dL (ref 0.44–1.00)
GFR, Estimated: >60 mL/min (ref >60)
Glucose, Bld: 111 mg/dL — ABNORMAL HIGH (ref 70–99)
Potassium: 3.8 mmol/L (ref 3.5–5.1)
Sodium: 139 mmol/L (ref 135–145)

## 2021-08-22 LAB — HEMOGLOBIN A1C
Hgb A1c MFr Bld: 6.4 % — ABNORMAL HIGH (ref 4.8–5.6)
Mean Plasma Glucose: 136.98 mg/dL

## 2021-08-22 LAB — GLUCOSE, CAPILLARY
Glucose-Capillary: 101 mg/dL — ABNORMAL HIGH (ref 70–99)
Glucose-Capillary: 125 mg/dL — ABNORMAL HIGH (ref 70–99)
Glucose-Capillary: 164 mg/dL — ABNORMAL HIGH (ref 70–99)
Glucose-Capillary: 122 mg/dL — ABNORMAL HIGH (ref 70–99)

## 2021-08-22 LAB — LIPID PANEL
Cholesterol: 147 mg/dL (ref 0–200)
HDL: 14 mg/dL — ABNORMAL LOW (ref >40)
LDL Cholesterol: 93 mg/dL (ref 0–99)
Total CHOL/HDL Ratio: 10.5 RATIO
Triglycerides: 199 mg/dL — ABNORMAL HIGH (ref <150)
VLDL: 40 mg/dL (ref 0–40)

## 2021-08-22 MED ORDER — ENSURE ENLIVE PO LIQD: 237.0000 mL | Freq: Two times a day (BID) | ORAL | Status: DC

## 2021-08-22 MED ORDER — IOHEXOL 350 MG/ML SOLN
100.0000 mL | Freq: Once | INTRAVENOUS | Status: AC | PRN
  Administered 2021-08-22: 100 mL via INTRAVENOUS

## 2021-08-22 NOTE — Progress Notes (Signed)
? ?Progress Note ? ?Patient Name: Lauren Schmidt ?Date of Encounter: 08/22/2021 ? ?[facility] Cardiologist: None  ? ?Subjective  ? ?Continues to have chest/epigastric discomfort ?Coronary CTA pending ?TTE with normal BiV function and no significant valve disease ? ?Inpatient Medications  ?  ?Scheduled Meds: ? dapagliflozin propanediol  5 mg Oral QHS  ? enoxaparin (LOVENOX) injection  40 mg Subcutaneous Q24H  ? feeding supplement  237 mL Oral BID BM  ? fenofibrate  160 mg Oral Daily  ? insulin aspart  0-15 Units Subcutaneous TID WC  ? insulin aspart  0-5 Units Subcutaneous QHS  ? lisinopril  20 mg Oral Daily  ? metoprolol succinate  50 mg Oral Daily  ? pantoprazole  40 mg Oral BID  ? sertraline  200 mg Oral Daily  ? simvastatin  40 mg Oral QPM  ? sucralfate  1 g Oral BID  ? ?Continuous Infusions: ? sodium chloride 50 mL/hr at 08/22/21 0546  ? cefTRIAXone (ROCEPHIN)  IV 1 g (08/21/21 2137)  ? ?PRN Meds: ?acetaminophen, ALPRAZolam, morphine injection, ondansetron (ZOFRAN) IV, prochlorperazine  ? ?Vital Signs  ?  ?Vitals:  ? 08/21/21 2221 08/22/21 0223 08/22/21 0645 08/22/21 1123  ?BP: (!) 160/72 138/83 (!) 153/80 137/67  ?Pulse: 70 65 65 61  ?Resp: 16 16 16 18   ?Temp: 98 ?F (36.7 ?C) 97.8 ?F (36.6 ?C) 97.8 ?F (36.6 ?C) 97.7 ?F (36.5 ?C)  ?TempSrc: Oral Oral Oral Oral  ?SpO2: 99% 94% 100% 98%  ?Weight:      ?Height:      ? ? ?Intake/Output Summary (Last 24 hours) at 08/22/2021 1329 ?Last data filed at 08/22/2021 1126 ?Gross per 24 hour  ?Intake 1338.85 ml  ?Output --  ?Net 1338.85 ml  ? ? ?  08/21/2021  ? 10:37 AM 08/09/2021  ?  6:04 AM 03/31/2018  ?  8:25 AM  ?Last 3 Weights  ?Weight (lbs) 260 lb 260 lb 250 lb  ?Weight (kg) 117.935 kg 117.935 kg 113.399 kg  ?   ? ?Telemetry  ?  ?NSR - Personally Reviewed ? ?ECG  ?  ?NSR - Personally Reviewed ? ?Physical Exam  ? ?GEN: No acute distress.   ?Neck: No JVD ?Cardiac: RRR, no murmurs, rubs, or gallops.  ?Respiratory: Clear to auscultation bilaterally. ?GI: Obese, mildly TTP in  epigastric region ?MS: No edema; No deformity. ?Neuro:  Nonfocal  ?Psych: Normal affect  ? ?Labs  ?  ?High Sensitivity Troponin:   ?Recent Labs  ?Lab 08/21/21 ?[id number] 08/21/21 ?[id number]  ?TROPONINIHS 69* 3  ?   ?Chemistry ?Recent Labs  ?Lab 08/21/21 ?[id number] 08/21/21 ?[id number] 08/22/21 ?[id number]  ?NA 138  --  139  ?K 4.2  --  3.8  ?CL 103  --  104  ?CO2 26  --  26  ?GLUCOSE 99  --  111*  ?BUN 18  --  12  ?CREATININE 0.79 0.75 0.80  ?CALCIUM 9.3  --  8.8*  ?PROT 7.9  --   --   ?ALBUMIN 4.1  --   --   ?AST 35  --   --   ?ALT 25  --   --   ?ALKPHOS 33*  --   --   ?BILITOT 0.7  --   --   ?GFRNONAA >60 >60 >60  ?ANIONGAP 9  --  9  ?  ?Lipids  ?Recent Labs  ?Lab 08/22/21 ?[id number]  ?CHOL 147  ?TRIG 199*  ?HDL 14*  ?[place] 93  ?CHOLHDL 10.5  ?  ?Hematology ?Recent  Labs  ?Lab 08/21/21 ?[id number] 08/21/21 ?[id number] 08/22/21 ?[id number]  ?WBC 6.2 5.3 4.9  ?RBC 4.21 3.84* 4.17  ?HGB 10.7* 10.1* 11.0*  ?HCT 36.0 32.0* 35.8*  ?MCV 85.5 83.3 85.9  ?MCH 25.4* 26.3 26.4  ?MCHC 29.7* 31.6 30.7  ?RDW 15.1 15.0 15.1  ?PLT 404* 360 346  ? ?Thyroid No results for input(s): TSH, FREET4 in the last 168 hours.  ?BNPNo results for input(s): BNP, PROBNP in the last 168 hours.  ?DDimer No results for input(s): DDIMER in the last 168 hours.  ? ?Radiology  ?  ?DG Chest 2 View ? ?Result Date: 08/21/2021 ?CLINICAL DATA:  61 year old female with history of chest pain and upper abdominal pain intermittently for the past 3 weeks. EXAM: CHEST - 2 VIEW COMPARISON:  Chest x-ray 08/27/2011. FINDINGS: Lung volumes are low. No consolidative airspace disease. No pleural effusions. No pneumothorax. No pulmonary nodule or mass noted. Pulmonary vasculature and the cardiomediastinal silhouette are within normal limits. IMPRESSION: 1. Low lung volumes without radiographic evidence of acute cardiopulmonary disease. Electronically Signed   By: Vinnie Langton M.D.   On: 08/21/2021 05:52  ? ?CT ABDOMEN PELVIS W CONTRAST ? ?Result Date: 08/21/2021 ?CLINICAL DATA:  61 year old female with history of  chest and upper abdominal pain for the past 3 weeks, worsening this morning. EXAM: CT ABDOMEN AND PELVIS WITH CONTRAST TECHNIQUE: Multidetector CT imaging of the abdomen and pelvis was performed using the standard protocol following bolus administration of intravenous contrast. RADIATION DOSE REDUCTION: This exam was performed according to the departmental dose-optimization program which includes automated exposure control, adjustment of the mA and/or kV according to patient size and/or use of iterative reconstruction technique. CONTRAST:  176mL OMNIPAQUE IOHEXOL 300 MG/ML  SOLN COMPARISON:  CT of the abdomen and pelvis 08/09/2021. FINDINGS: Lower chest: Unremarkable. Hepatobiliary: Diffuse low attenuation throughout the hepatic parenchyma, indicative of a background of severe hepatic steatosis. No suspicious cystic or solid hepatic lesions. No intra or extrahepatic biliary ductal dilatation. Gallbladder is normal in appearance. Pancreas: No pancreatic mass. No pancreatic ductal dilatation. No pancreatic or peripancreatic fluid collections or inflammatory changes. Spleen: Unremarkable. Adrenals/Urinary Tract: In the lower pole of the right kidney there are 2 fat containing lesions, indicative of angiomyolipomas. The largest of these measures 2.3 x 1.4 cm (axial image 48 of series 2). Left kidney and bilateral adrenal glands are normal in appearance. No hydroureteronephrosis. Small amount of gas non dependently in the lumen of the urinary bladder. Urinary bladder is otherwise normal in appearance. Stomach/Bowel: The appearance of the stomach is normal. There is no pathologic dilatation of small bowel or colon. The appendix is not confidently identified and may be surgically absent. Regardless, there are no inflammatory changes noted adjacent to the cecum to suggest the presence of an acute appendicitis at this time. Vascular/Lymphatic: Minimal aortic atherosclerosis, without evidence of aneurysm or dissection in the  abdominal or pelvic vasculature. No lymphadenopathy noted in the abdomen or pelvis. Reproductive: Small coarse calcifications in the fundus of the uterus, likely tiny calcified fibroids. Ovaries are unremarkable in appearance. Other: No significant volume of ascites.  No pneumoperitoneum. Musculoskeletal: There are no aggressive appearing lytic or blastic lesions noted in the visualized portions of the skeleton. IMPRESSION: 1. Gas is present in the lumen of the urinary bladder. This could be iatrogenic in the setting of recent catheterization for urinalysis. If there has been no recent catheterization, however, correlation with urinalysis would be recommended, as the possibility of urinary tract infection with gas-forming organisms is  not excluded. 2. No other potential acute findings noted elsewhere in the abdomen or pelvis to account for the patient's symptoms. 3. Severe hepatic steatosis. 4. 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 5. Mild aortic atherosclerosis. Electronically Signed   By: Vinnie Langton M.D.   On: 08/21/2021 06:45  ? ?CT CORONARY MORPH W/CTA COR W/SCORE W/CA W/CM &/OR WO/CM ? ?Result Date: 08/22/2021 ?EXAM: OVER-READ INTERPRETATION  CT CHEST The following report is a limited chest CT over-read performed by radiologist Dr. Abigail Miyamoto of [facility], [place] on 08/22/2021. This over-read does not include interpretation of cardiac or coronary anatomy or pathology. The coronary CTA interpretation by the cardiologist is attached. COMPARISON:  CTA chest of 1 day prior FINDINGS: Vascular: Aortic atherosclerosis. No central pulmonary embolism, on this non-dedicated study. Mediastinum/Nodes: No imaged thoracic adenopathy. Lungs/Pleura: No pleural fluid. Tiny pulmonary nodules were detailed on yesterday's dedicated diagnostic chest CT. No superimposed acute lobar consolidation. Upper Abdomen: Hepatic steatosis. Normal imaged portions of  the spleen, stomach. Musculoskeletal: No acute osseous abnormality. Lower thoracic spondylosis. IMPRESSION: No acute findings in the imaged extracardiac chest. Aortic Atherosclerosis (ICD10-I70.0). Hep

## 2021-08-22 NOTE — Progress Notes (Signed)
Pt NPO after midnight for tentatively planned ENDO. PT updated and acknowledged understanding. SRP, RN ?

## 2021-08-22 NOTE — Plan of Care (Signed)

## 2021-08-22 NOTE — Progress Notes (Signed)
?PROGRESS NOTE ? ? ? ?Lauren Schmidt  [id number] DOB: Jul 31, 1960 DOA: 08/21/2021 ?PCP: Seward Carol, MD  ? ?Brief Narrative: ?61 year old with past medical history significant for hypertension, hyperlipidemia, diabetes type 2, morbid obesity presented with epigastric midsternal chest pain that is started 3 weeks prior to admission.  She initially presented to the ED 08/09/2021 and her work-up was negative.  She was discharged on Protonix and Carafate with follow-up with [facility].  Barium swallow was ordered 5/9 that was negative.  She presented with worsening symptoms. ? ?Initial troponin was 69 and subsequently down to 3.  Cardiology was consulted and patient underwent coronary CT results pending.  GI has been consulted, and is awaiting for cardiology clearance to proceed with endoscopy. ? ? ? ?Assessment & Plan: ?  ?Principal Problem: ?  Chest pain ?Active Problems: ?  Obesity, Class III, BMI 40-49.9 (morbid obesity) ([place]) ?  HLD (hyperlipidemia) ?  HTN (hypertension) ?  DM2 (diabetes mellitus, type 2) ([place]) ?  Epigastric pain ? ?1-Chest pain/epigastric pain ?Plan elevation of troponin initially at 69 down to 3. ?Cardiology consulted and patient underwent coronary CTA results pending ?GI following planning endoscopy when cleared by GI ?Recent barium swallow outpatient was negative ?Continue with PPI, Carafate. ?: Normal ejection fraction ? ?2-Hypertension: ?Continue with metoprolol and lisinopril ? ?3-Diabetes type 2 ?Continue with [place] ?Sliding scale insulin ? ?3-Morbid  obesity: Needs lifestyle modification ? ?4-HLD; continue with fenofibrate, Zocor. ? ?UTI: Follow urine culture.  continue with ceftriaxone ? ? ? ? ? ?Estimated body mass index is 50.78 kg/m? as calculated from the following: ?  Height as of this encounter: 5' (1.524 m). ?  Weight as of this encounter: 117.9 kg. ? ? ?DVT prophylaxis: Lovenox ?Code Status: Full code ?Family Communication: Care discussed with patient ?Disposition Plan:  ?Status  is: Observation ?The patient remains OBS appropriate and will d/c before 2 midnights. ? ? ? ?Consultants:  ?GI ?Cardiology  ? ?Procedures:  ?Coronary CT ? ?Antimicrobials:  ? ? ?Subjective: ?She vomited yesterday some fruit. She was able to eat some jello last night.  ? ? ?Objective: ?Vitals:  ? 08/21/21 2221 08/22/21 0223 08/22/21 0645 08/22/21 1123  ?BP: (!) 160/72 138/83 (!) 153/80 137/67  ?Pulse: 70 65 65 61  ?Resp: 16 16 16 18   ?Temp: 98 ?F (36.7 ?C) 97.8 ?F (36.6 ?C) 97.8 ?F (36.6 ?C) 97.7 ?F (36.5 ?C)  ?TempSrc: Oral Oral Oral Oral  ?SpO2: 99% 94% 100% 98%  ?Weight:      ?Height:      ? ? ?Intake/Output Summary (Last 24 hours) at 08/22/2021 1313 ?Last data filed at 08/22/2021 1126 ?Gross per 24 hour  ?Intake 1338.85 ml  ?Output --  ?Net 1338.85 ml  ? ?Filed Weights  ? 08/21/21 1037  ?Weight: 117.9 kg  ? ? ?Examination: ? ?General exam: Appears calm and comfortable  ?Respiratory system: Clear to auscultation. Respiratory effort normal. ?Cardiovascular system: S1 & S2 heard, RRR. No JVD, murmurs, rubs, gallops or clicks. No pedal edema. ?Gastrointestinal system: Abdomen is nondistended, soft and nontender. No organomegaly or masses felt. Normal bowel sounds heard. ?Central nervous system: Alert and oriented. No focal neurological deficits. ?Extremities: Symmetric 5 x 5 power. ? ? ?Data Reviewed: I have personally reviewed following labs and imaging studies ? ?CBC: ?Recent Labs  ?Lab 08/21/21 ?[id number] 08/21/21 ?[id number] 08/22/21 ?[id number]  ?WBC 6.2 5.3 4.9  ?HGB 10.7* 10.1* 11.0*  ?HCT 36.0 32.0* 35.8*  ?MCV 85.5 83.3 85.9  ?PLT 404* 360 346  ? ?  Basic Metabolic Panel: ?Recent Labs  ?Lab 08/21/21 ?[id number] 08/21/21 ?[id number] 08/22/21 ?[id number]  ?NA 138  --  139  ?K 4.2  --  3.8  ?CL 103  --  104  ?CO2 26  --  26  ?GLUCOSE 99  --  111*  ?BUN 18  --  12  ?CREATININE 0.79 0.75 0.80  ?CALCIUM 9.3  --  8.8*  ? ?GFR: ?Estimated Creatinine Clearance: 85.8 mL/min (by C-G formula based on SCr of 0.8 mg/dL). ?Liver Function Tests: ?Recent Labs   ?Lab 08/21/21 ?[id number]  ?AST 35  ?ALT 25  ?ALKPHOS 33*  ?BILITOT 0.7  ?PROT 7.9  ?ALBUMIN 4.1  ? ?Recent Labs  ?Lab 08/21/21 ?[id number]  ?LIPASE 35  ? ?No results for input(s): AMMONIA in the last 168 hours. ?Coagulation Profile: ?No results for input(s): INR, PROTIME in the last 168 hours. ?Cardiac Enzymes: ?Recent Labs  ?Lab 08/21/21 ?0706  ?CKTOTAL 52  ? ?BNP (last 3 results) ?No results for input(s): PROBNP in the last 8760 hours. ?HbA1C: ?Recent Labs  ?  08/21/21 ?2157  ?HGBA1C 6.4*  ? ?CBG: ?Recent Labs  ?Lab 08/21/21 ?2140 08/22/21 ?0756 08/22/21 ?1124  ?GLUCAP 130* 101* 125*  ? ?Lipid Profile: ?Recent Labs  ?  08/22/21 ?0358  ?CHOL 147  ?HDL 14*  ?[place] 93  ?TRIG 199*  ?CHOLHDL 10.5  ? ?Thyroid Function Tests: ?No results for input(s): TSH, T4TOTAL, FREET4, T3FREE, THYROIDAB in the last 72 hours. ?Anemia Panel: ?No results for input(s): VITAMINB12, FOLATE, FERRITIN, TIBC, IRON, RETICCTPCT in the last 72 hours. ?Sepsis Labs: ?No results for input(s): PROCALCITON, LATICACIDVEN in the last 168 hours. ? ?No results found for this or any previous visit (from the past 240 hour(s)).  ? ? ? ? ? ?Radiology Studies: ?DG Chest 2 View ? ?Result Date: 08/21/2021 ?CLINICAL DATA:  62 year old female with history of chest pain and upper abdominal pain intermittently for the past 3 weeks. EXAM: CHEST - 2 VIEW COMPARISON:  Chest x-ray 08/27/2011. FINDINGS: Lung volumes are low. No consolidative airspace disease. No pleural effusions. No pneumothorax. No pulmonary nodule or mass noted. Pulmonary vasculature and the cardiomediastinal silhouette are within normal limits. IMPRESSION: 1. Low lung volumes without radiographic evidence of acute cardiopulmonary disease. Electronically Signed   By: Vinnie Langton M.D.   On: 08/21/2021 05:52  ? ?CT ABDOMEN PELVIS W CONTRAST ? ?Result Date: 08/21/2021 ?CLINICAL DATA:  61 year old female with history of chest and upper abdominal pain for the past 3 weeks, worsening this morning. EXAM: CT  ABDOMEN AND PELVIS WITH CONTRAST TECHNIQUE: Multidetector CT imaging of the abdomen and pelvis was performed using the standard protocol following bolus administration of intravenous contrast. RADIATION DOSE REDUCTION: This exam was performed according to the departmental dose-optimization program which includes automated exposure control, adjustment of the mA and/or kV according to patient size and/or use of iterative reconstruction technique. CONTRAST:  157mL OMNIPAQUE IOHEXOL 300 MG/ML  SOLN COMPARISON:  CT of the abdomen and pelvis 08/09/2021. FINDINGS: Lower chest: Unremarkable. Hepatobiliary: Diffuse low attenuation throughout the hepatic parenchyma, indicative of a background of severe hepatic steatosis. No suspicious cystic or solid hepatic lesions. No intra or extrahepatic biliary ductal dilatation. Gallbladder is normal in appearance. Pancreas: No pancreatic mass. No pancreatic ductal dilatation. No pancreatic or peripancreatic fluid collections or inflammatory changes. Spleen: Unremarkable. Adrenals/Urinary Tract: In the lower pole of the right kidney there are 2 fat containing lesions, indicative of angiomyolipomas. The largest of these measures 2.3 x 1.4 cm (axial image 48 of series  2). Left kidney and bilateral adrenal glands are normal in appearance. No hydroureteronephrosis. Small amount of gas non dependently in the lumen of the urinary bladder. Urinary bladder is otherwise normal in appearance. Stomach/Bowel: The appearance of the stomach is normal. There is no pathologic dilatation of small bowel or colon. The appendix is not confidently identified and may be surgically absent. Regardless, there are no inflammatory changes noted adjacent to the cecum to suggest the presence of an acute appendicitis at this time. Vascular/Lymphatic: Minimal aortic atherosclerosis, without evidence of aneurysm or dissection in the abdominal or pelvic vasculature. No lymphadenopathy noted in the abdomen or pelvis.  Reproductive: Small coarse calcifications in the fundus of the uterus, likely tiny calcified fibroids. Ovaries are unremarkable in appearance. Other: No significant volume of ascites.  No pneumoperitoneum. Musc

## 2021-08-22 NOTE — Progress Notes (Signed)
[facility] Progress Note ? ?Marcine Gadway 61 y.o. 05/12/1960 ? ? ?Subjective: ?Patient seen as being wheeled out for her cardiac CT.  Awaiting results of cardiac work-up.  Patient notes her epigastric pain has not improved. ? ?ROS : Review of Systems  ?Cardiovascular:  Positive for chest pain.  ?Gastrointestinal:  Positive for abdominal pain. Negative for blood in stool, constipation, diarrhea, heartburn, melena, nausea and vomiting.  ?Genitourinary:  Negative for dysuria and urgency.   ? ? ?Objective: ?Vital signs in last 24 hours: ?Vitals:  ? 08/22/21 0645 08/22/21 1123  ?BP: (!) 153/80 137/67  ?Pulse: 65 61  ?Resp: 16 18  ?Temp: 97.8 ?F (36.6 ?C) 97.7 ?F (36.5 ?C)  ?SpO2: 100% 98%  ? ? ?Physical Exam: ? ?General:  Alert, cooperative, no distress, appears stated age  ?Head:  Normocephalic, without obvious abnormality, atraumatic  ?Eyes:  Anicteric sclera, EOM's intact  ?Lungs:   Clear to auscultation bilaterally, respirations unlabored  ?Heart:  Regular rate and rhythm, S1, S2 normal  ?Abdomen:   Soft, epigastric tenderness, bowel sounds active all four quadrants,  no masses,   ?Extremities: Extremities normal, atraumatic, no  edema  ?Pulses: 2+ and symmetric  ? ? ?Lab Results: ?Recent Labs  ?  08/21/21 ?9509 08/21/21 ?3267 08/22/21 ?1245  ?NA 138  --  139  ?K 4.2  --  3.8  ?CL 103  --  104  ?CO2 26  --  26  ?GLUCOSE 99  --  111*  ?BUN 18  --  12  ?CREATININE 0.79 0.75 0.80  ?CALCIUM 9.3  --  8.8*  ? ?Recent Labs  ?  08/21/21 ?8099  ?AST 35  ?ALT 25  ?ALKPHOS 33*  ?BILITOT 0.7  ?PROT 7.9  ?ALBUMIN 4.1  ? ?Recent Labs  ?  08/21/21 ?0951 08/22/21 ?0748  ?WBC 5.3 4.9  ?HGB 10.1* 11.0*  ?HCT 32.0* 35.8*  ?MCV 83.3 85.9  ?PLT 360 346  ? ?No results for input(s): LABPROT, INR in the last 72 hours. ? ? ? ?Assessment ?Epigastric abdominal pain/chest pain ?Recent NSAID use ?Shortness of breath ?Anemia ?  ?Patient with constant and worsening epigastric pain for 3 weeks.  She had frequent NSAID use up until she  began noticing her abdominal pain.  Possible gastritis, peptic ulcer disease, esophagitis as a cause of her epigastric pain.  Patient does not seem to be improving with GI cocktail.  ?  ?Of concern patient also noticed shortness of breath and chest pain.  Event inciting her pain also seem to be 1/2 mile walk.  Concern for possible cardiac etiology of pain.  Awaiting results of cardiac work-up.  Can do further GI evaluation after cardiac clearance. ?  ?Patient with hemoglobin of 10.7 on admission.  Last ED visit 08/09/2021 hemoglobin was 10.6.  Today hemoglobin 11 and stable.  This seems to be her baseline hemoglobin.  She denies any melena or hematochezia.  I do not suspect active bleeding.  ? ? ?Plan: ?Also cardiac work-up.  If cardiac work-up negative recommend EGD and abdominal ultrasound.  ?Continue supportive care and analgesia. ?Continue carb modified diet. ?Continue Protonix 40 mg twice daily ?[facility] will follow ? ?Emmit Alexanders PA-C ?08/22/2021, 1:07 PM ? ?Contact #  [phone]  ?

## 2021-08-23 ENCOUNTER — Observation Stay (HOSPITAL_COMMUNITY): Payer: 59 | Admitting: Certified Registered Nurse Anesthetist

## 2021-08-23 ENCOUNTER — Observation Stay (HOSPITAL_BASED_OUTPATIENT_CLINIC_OR_DEPARTMENT_OTHER): Payer: 59 | Admitting: Certified Registered Nurse Anesthetist

## 2021-08-23 ENCOUNTER — Encounter (HOSPITAL_COMMUNITY): Payer: Self-pay | Admitting: Internal Medicine

## 2021-08-23 ENCOUNTER — Encounter (HOSPITAL_COMMUNITY)
Admission: EM | Disposition: A | Discharge status: Home / Self Care | Payer: Self-pay | Source: Home / Self Care | Attending: Internal Medicine

## 2021-08-23 DIAGNOSIS — E119 Type 2 diabetes mellitus without complications: Secondary | ICD-10-CM

## 2021-08-23 DIAGNOSIS — K2970 Gastritis, unspecified, without bleeding: Secondary | ICD-10-CM | POA: Diagnosis not present

## 2021-08-23 DIAGNOSIS — I10 Essential (primary) hypertension: Secondary | ICD-10-CM

## 2021-08-23 DIAGNOSIS — R079 Chest pain, unspecified: Secondary | ICD-10-CM | POA: Diagnosis not present

## 2021-08-23 DIAGNOSIS — D649 Anemia, unspecified: Secondary | ICD-10-CM

## 2021-08-23 HISTORY — PX: BIOPSY: SHX5522

## 2021-08-23 HISTORY — PX: ESOPHAGOGASTRODUODENOSCOPY: SHX5428

## 2021-08-23 LAB — GLUCOSE, CAPILLARY
Glucose-Capillary: 110 mg/dL — ABNORMAL HIGH (ref 70–99)
Glucose-Capillary: 124 mg/dL — ABNORMAL HIGH (ref 70–99)
Glucose-Capillary: 145 mg/dL — ABNORMAL HIGH (ref 70–99)
Glucose-Capillary: 121 mg/dL — ABNORMAL HIGH (ref 70–99)
Glucose-Capillary: 115 mg/dL — ABNORMAL HIGH (ref 70–99)

## 2021-08-23 LAB — CBC
HCT: 34.6 % — ABNORMAL LOW (ref 36.0–46.0)
Hemoglobin: 10.5 g/dL — ABNORMAL LOW (ref 12.0–15.0)
MCH: 26.2 pg (ref 26.0–34.0)
MCHC: 30.3 g/dL (ref 30.0–36.0)
MCV: 86.3 fL (ref 80.0–100.0)
Platelets: 317 10*3/uL (ref 150–400)
RBC: 4.01 MIL/uL (ref 3.87–5.11)
RDW: 14.8 % (ref 11.5–15.5)
WBC: 4.2 10*3/uL (ref 4.0–10.5)
nRBC: 0 % (ref 0.0–0.2)

## 2021-08-23 LAB — BASIC METABOLIC PANEL
Anion gap: 9 (ref 5–15)
BUN: 11 mg/dL (ref 8–23)
CO2: 25 mmol/L (ref 22–32)
Calcium: 8.7 mg/dL — ABNORMAL LOW (ref 8.9–10.3)
Chloride: 106 mmol/L (ref 98–111)
Creatinine, Ser: 0.77 mg/dL (ref 0.44–1.00)
GFR, Estimated: >60 mL/min (ref >60)
Glucose, Bld: 115 mg/dL — ABNORMAL HIGH (ref 70–99)
Potassium: 3.7 mmol/L (ref 3.5–5.1)
Sodium: 140 mmol/L (ref 135–145)

## 2021-08-23 LAB — URINE CULTURE: Culture: <10000 — AB

## 2021-08-23 SURGERY — EGD (ESOPHAGOGASTRODUODENOSCOPY)
Anesthesia: Monitor Anesthesia Care

## 2021-08-23 MED ORDER — SODIUM CHLORIDE 0.9 % IV SOLN: INTRAVENOUS | Status: DC

## 2021-08-23 MED ORDER — PROPOFOL 500 MG/50ML IV EMUL
INTRAVENOUS | Status: DC | PRN
  Administered 2021-08-23: 75 ug/kg/min via INTRAVENOUS
  Administered 2021-08-23: 50 mg via INTRAVENOUS

## 2021-08-23 MED ORDER — LACTATED RINGERS IV SOLN: INTRAVENOUS | Status: DC

## 2021-08-23 MED ORDER — MORPHINE SULFATE (PF) 2 MG/ML IV SOLN
2.0000 mg | INTRAVENOUS | Status: DC | PRN
  Administered 2021-08-23: 2 mg via INTRAVENOUS
  Administered 2021-08-23: 4 mg via INTRAVENOUS
  Administered 2021-08-24 – 2021-08-27 (×9): 2 mg via INTRAVENOUS
  Administered 2021-08-28: 4 mg via INTRAVENOUS
  Filled 2021-08-23 (×11): qty 1
  Filled 2021-08-23: qty 2
  Filled 2021-08-23: qty 1
  Filled 2021-08-23: qty 2
  Filled 2021-08-23: qty 1

## 2021-08-23 NOTE — Progress Notes (Signed)
PROGRESS NOTE    Lauren Schmidt  [id number] DOB: [birth year] DOA: 08/21/2021 PCP: Seward Carol, MD   Brief Narrative: 61 year old with past medical history significant for hypertension, hyperlipidemia, diabetes type 2, morbid obesity presented with epigastric midsternal chest pain that is started 3 weeks prior to admission.  She initially presented to the ED 08/09/2021 and her work-up was negative.  She was discharged on Protonix and Carafate with follow-up with [facility].  Barium swallow was ordered 5/9 that was negative.  She presented with worsening symptoms.  Initial troponin was 69 and subsequently down to 3.  Cardiology was consulted and patient underwent coronary CT results pending.  GI has been consulted, and is awaiting for cardiology clearance to proceed with endoscopy.    Assessment & Plan:   Principal Problem:   Chest pain Active Problems:   Obesity, Class III, BMI 40-49.9 (morbid obesity) (HCC)   HLD (hyperlipidemia)   HTN (hypertension)   DM2 (diabetes mellitus, type 2) (HCC)   Epigastric pain  1-Chest pain/epigastric pain Plan elevation of troponin initially at 69 down to 3. Cardiology consulted and patient underwent coronary CTA  Recent barium swallow outpatient was negative Continue with PPI, Carafate. ECHO: Normal ejection fraction CT Coronary ; Moderate coronary diseases. Cardiology recommend medical management  Endoscopy: mild gastritis. GI recommend RUQ [place].  Required IV morphine today for pain controlled   2-Hypertension: Continue with metoprolol and lisinopril  3-Diabetes type 2 Continue with Farxiga Sliding scale insulin  3-Morbid  obesity: Needs lifestyle modification  4-HLD; continue with fenofibrate, Zocor.  UTI: continue with ceftriaxone Urine culture insignificant growth.  Plan to complete 3 days Tx.       Estimated body mass index is 50.78 kg/m as calculated from the following:   Height as of this encounter: 5' (1.524 m).   Weight  as of this encounter: 117.9 kg.   DVT prophylaxis: Lovenox Code Status: Full code Family Communication: Care discussed with patient Disposition Plan:  Status is: Observation The patient remains OBS appropriate and will d/c before 2 midnights.    Consultants:  GI Cardiology   Procedures:  Coronary CT  Antimicrobials:    Subjective: She is still having epigastric pain.    Objective: Vitals:   08/23/21 1142 08/23/21 1313 08/23/21 1323 08/23/21 1345  BP: (!) 163/59 (!) 143/64 (!) 146/66 (!) 155/87  Pulse: 71 71 69 66  Resp: 18 12 10 12   Temp: 98.4 F (36.9 C) 97.9 F (36.6 C)  98.4 F (36.9 C)  TempSrc: Temporal Temporal  Oral  SpO2: 97% 100% 97% 99%  Weight:      Height:        Intake/Output Summary (Last 24 hours) at 08/23/2021 1504 Last data filed at 08/23/2021 1430 Gross per 24 hour  Intake 1853.26 ml  Output --  Net 1853.26 ml    Filed Weights   08/21/21 1037  Weight: 117.9 kg    Examination:  General exam: NAD Respiratory system: CTA Cardiovascular system: S 1, S 2 RRR Gastrointestinal system: BS present, soft, nt Central nervous system: alert.  Extremities: no edema   Data Reviewed: I have personally reviewed following labs and imaging studies  CBC: Recent Labs  Lab 08/21/21 0523 08/21/21 0951 08/22/21 0748 08/23/21 0406  WBC 6.2 5.3 4.9 4.2  HGB 10.7* 10.1* 11.0* 10.5*  HCT 36.0 32.0* 35.8* 34.6*  MCV 85.5 83.3 85.9 86.3  PLT 404* 360 346 [id number]    Basic Metabolic Panel: Recent Labs  Lab 08/21/21 0523 08/21/21 0951  08/22/21 0717 08/23/21 0406  NA 138  --  139 140  K 4.2  --  3.8 3.7  CL 103  --  104 106  CO2 26  --  26 25  GLUCOSE 99  --  111* 115*  BUN 18  --  12 11  CREATININE 0.79 0.75 0.80 0.77  CALCIUM 9.3  --  8.8* 8.7*    GFR: Estimated Creatinine Clearance: 85.8 mL/min (by C-G formula based on SCr of 0.77 mg/dL). Liver Function Tests: Recent Labs  Lab 08/21/21 0523  AST 35  ALT 25  ALKPHOS 33*  BILITOT  0.7  PROT 7.9  ALBUMIN 4.1    Recent Labs  Lab 08/21/21 0523  LIPASE 35    No results for input(s): AMMONIA in the last 168 hours. Coagulation Profile: No results for input(s): INR, PROTIME in the last 168 hours. Cardiac Enzymes: Recent Labs  Lab 08/21/21 0706  CKTOTAL 52    BNP (last 3 results) No results for input(s): PROBNP in the last 8760 hours. HbA1C: Recent Labs    08/21/21 2157  HGBA1C 6.4*    CBG: Recent Labs  Lab 08/22/21 1124 08/22/21 1627 08/22/21 2107 08/23/21 0719 08/23/21 1116  GLUCAP 125* 164* 122* 110* 124*    Lipid Profile: Recent Labs    08/22/21 0358  CHOL 147  HDL 14*  LDLCALC 93  TRIG 199*  CHOLHDL 10.5    Thyroid Function Tests: No results for input(s): TSH, T4TOTAL, FREET4, T3FREE, THYROIDAB in the last 72 hours. Anemia Panel: No results for input(s): VITAMINB12, FOLATE, FERRITIN, TIBC, IRON, RETICCTPCT in the last 72 hours. Sepsis Labs: No results for input(s): PROCALCITON, LATICACIDVEN in the last 168 hours.  Recent Results (from the past 240 hour(s))  Urine Culture     Status: Abnormal   Collection Time: 08/22/21  5:27 PM   Specimen: In/Out Cath Urine  Result Value Ref Range Status   Specimen Description   Final    IN/OUT CATH URINE Performed at [facility] [address], [place], [place] [address]    Special Requests   Final    NONE Performed at [facility], [place] [address], [place], [place] [address]    Culture (A)  Final    <10,000 COLONIES/mL INSIGNIFICANT GROWTH Performed at [facility] [address] [place], [place] [address]    Report Status 08/23/2021 FINAL  Final         Radiology Studies: CT CORONARY MORPH W/CTA COR W/SCORE W/CA W/CM &/OR WO/CM  Addendum Date: 08/22/2021   ADDENDUM REPORT: 08/22/2021 14:32 HISTORY: Chest pain/anginal equiv, intermediate CAD risk, not treadmill candidate EXAM: Cardiac/Coronary  CT TECHNIQUE: The patient was  scanned on a [employer]. PROTOCOL: A 120 kV prospective scan was triggered in the descending thoracic aorta at 111 HU's. Axial non-contrast 3 mm slices were carried out through the heart. The data set was analyzed on a dedicated work station and scored using the Agatston method. Gantry rotation speed was 250 msecs and collimation was .6 mm. Beta blockade and 0.8 mg of sl NTG was given. The 3D data set was reconstructed in 5% intervals of the 35-75 % of the R-R cycle. Systolic and diastolic phases were analyzed on a dedicated work station using MPR, MIP and VRT modes. The patient received 142mL OMNIPAQUE IOHEXOL 350 MG/ML SOLN contrast. FINDINGS: Image quality: Poor Noise artifact is: Moderate, signal to noise ratio is reduced. Coronary calcium score is 107, which places the  patient in the [place] percentile for age and sex matched control. Coronary arteries: Normal coronary origins.  Right dominance. Right Coronary Artery: Mild mixed atherosclerotic plaque in the proximal RCA, 25-49% stenosis. Moderate atherosclerotic plaque in the mid and distal RCA, 50-69% stenosis. Left Main Coronary Artery: No detectable plaque or stenosis. Left Anterior Descending Coronary Artery: Minimal scattered mixed atherosclerotic plaque in the LAD, <25% stenosis. Moderate atherosclerotic plaque in the distal LAD, 50-69% stenosis. Image quality is suboptimal to definitely assess lumen, probable moderate stenosis in the proximal first diagonal artery, 50-69% stenosis. Left Circumflex Artery: Small first OM with calcified plaque, may have stenosis but unable to determine definitively. No other detectable plaque or stenosis in LCx. Aorta: Normal size, 30 mm at the mid ascending aorta (level of the PA bifurcation) measured double oblique. No calcifications. No dissection. Aortic Valve: No calcifications. Other findings: Normal pulmonary vein drainage into the left atrium. Normal left atrial appendage without thrombus. Normal size of  the pulmonary artery. IMPRESSION: 1. 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 CT FFR will be performed and reported separately. 2. Coronary calcium score is 107, which places the patient in the [place] percentile for age and sex matched control. 3. Normal coronary origins with right dominance. Electronically Signed   By: Cherlynn Kaiser M.D.   On: 08/22/2021 14:32   Result Date: 08/22/2021 EXAM: OVER-READ INTERPRETATION  CT CHEST The following report is a limited chest CT over-read performed by radiologist Dr. Abigail Miyamoto of [facility], [place] on 08/22/2021. This over-read does not include interpretation of cardiac or coronary anatomy or pathology. The coronary CTA interpretation by the cardiologist is attached. COMPARISON:  CTA chest of 1 day prior FINDINGS: Vascular: Aortic atherosclerosis. No central pulmonary embolism, on this non-dedicated study. Mediastinum/Nodes: No imaged thoracic adenopathy. Lungs/Pleura: No pleural fluid. Tiny pulmonary nodules were detailed on yesterday's dedicated diagnostic chest CT. No superimposed acute lobar consolidation. Upper Abdomen: Hepatic steatosis. Normal imaged portions of the spleen, stomach. Musculoskeletal: No acute osseous abnormality. Lower thoracic spondylosis. IMPRESSION: No acute findings in the imaged extracardiac chest. Aortic Atherosclerosis (ICD10-I70.0). Hepatic steatosis. Electronically Signed: By: Abigail Miyamoto M.D. On: 08/22/2021 11:53   CT CORONARY FRACTIONAL FLOW RESERVE DATA PREP  Result Date: 08/22/2021 EXAM: CT FFR ANALYSIS CLINICAL DATA:  abnormal coronary CT FINDINGS: FFRct analysis was performed on the original cardiac CT angiogram dataset. Diagrammatic representation of the FFRct analysis is provided in a separate PDF document in PACS. This dictation was created using the PDF document and an interactive 3D model of the results. 3D model is not available in the EMR/PACS. Normal FFR range  is >0.80. Indeterminate (grey) zone is 0.76-0.80. FFR delta of 0.13 is considered significant. 1. Left Main: FFR = 0.98 2. LAD: Proximal FFR = 0.94, mid FFR = 0.90, Distal FFR = 0.56, first diagonal FFR = 0.81 3. LCX: Proximal FFR = 0.96, distal FFR = 0.93, OM1 FFR = 0.80 4. RCA: Proximal FFR = 0.96, mid FFR =0.93, Distal FFR = 0.81 IMPRESSION: 1. 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 The vessel is small caliber <81mm in this location. No other hemodynamically significant stenoses. RECOMMENDATIONS: Guideline-directed medical therapy and aggressive risk factor modification for secondary prevention of coronary artery disease. Electronically Signed   By: Cherlynn Kaiser M.D.   On: 08/22/2021 14:41        Scheduled Meds:  dapagliflozin propanediol  5 mg Oral QHS  enoxaparin (LOVENOX) injection  40 mg Subcutaneous Q24H   feeding supplement  237 mL Oral BID BM   fenofibrate  160 mg Oral Daily   insulin aspart  0-15 Units Subcutaneous TID WC   insulin aspart  0-5 Units Subcutaneous QHS   lisinopril  20 mg Oral Daily   metoprolol succinate  50 mg Oral Daily   pantoprazole  40 mg Oral BID   sertraline  200 mg Oral Daily   simvastatin  40 mg Oral QPM   sucralfate  1 g Oral BID   Continuous Infusions:  sodium chloride 50 mL/hr at 08/22/21 0546   cefTRIAXone (ROCEPHIN)  IV 1 g (08/22/21 1829)   lactated ringers 10 mL/hr at 08/23/21 1152     LOS: 0 days    Time spent: 35 minutes.     Elmarie Shiley, MD [facility]   If 7PM-7AM, please contact night-coverage [email or web address]  08/23/2021, 3:04 PM

## 2021-08-23 NOTE — Transfer of Care (Signed)
Immediate Anesthesia Transfer of Care Note  Patient: Lauren Schmidt  Procedure(s) Performed: Procedure(s): ESOPHAGOGASTRODUODENOSCOPY (EGD) (N/A) BIOPSY  Patient Location: PACU and Endoscopy Unit  Anesthesia Type:MAC  Level of Consciousness: awake, alert  and oriented  Airway & Oxygen Therapy: Patient Spontanous Breathing and Patient connected to nasal cannula oxygen  Post-op Assessment: Report given to RN and Post -op Vital signs reviewed and stable  Post vital signs: Reviewed and stable  Last Vitals:  Vitals:   08/23/21 1142 08/23/21 1313  BP: (!) 163/59 (!) 143/64  Pulse: 71 71  Resp: 18 12  Temp: 36.9 C   SpO2: 23% 361%    Complications: No apparent anesthesia complications

## 2021-08-23 NOTE — Anesthesia Postprocedure Evaluation (Signed)
Anesthesia Post Note  Patient: Lauren Schmidt  Procedure(s) Performed: ESOPHAGOGASTRODUODENOSCOPY (EGD) BIOPSY     Patient location during evaluation: Endoscopy Anesthesia Type: MAC Level of consciousness: awake and alert Pain management: pain level controlled Vital Signs Assessment: post-procedure vital signs reviewed and stable Respiratory status: spontaneous breathing, nonlabored ventilation, respiratory function stable and patient connected to nasal cannula oxygen Cardiovascular status: blood pressure returned to baseline and stable Postop Assessment: no apparent nausea or vomiting Anesthetic complications: no   No notable events documented.  Last Vitals:  Vitals:   08/23/21 1323 08/23/21 1345  BP: (!) 146/66 (!) 155/87  Pulse: 69 66  Resp: 10 12  Temp:  36.9 C  SpO2: 97% 99%    Last Pain:  Vitals:   08/23/21 1438  TempSrc:   PainSc: 9                  Barnet Glasgow

## 2021-08-23 NOTE — Interval H&P Note (Signed)
History and Physical Interval Note:  08/23/2021 12:46 PM  Lauren Schmidt  has presented today for surgery, with the diagnosis of abdominal pain, frequent NSAID use.  The various methods of treatment have been discussed with the patient and family. After consideration of risks, benefits and other options for treatment, the patient has consented to  Procedure(s): ESOPHAGOGASTRODUODENOSCOPY (EGD) (N/A) as a surgical intervention.  The patient's history has been reviewed, patient examined, no change in status, stable for surgery.  I have reviewed the patient's chart and labs.  Questions were answered to the patient's satisfaction.     Freddy Jaksch

## 2021-08-23 NOTE — Anesthesia Preprocedure Evaluation (Addendum)
Anesthesia Evaluation  Patient identified by MRN, date of birth, ID band Patient awake    Reviewed: Allergy & Precautions, NPO status , Patient's Chart, lab work & pertinent test results  Airway Mallampati: II  TM Distance: >3 FB Neck ROM: Full    Dental no notable dental hx. (+) Teeth Intact, Dental Advisory Given   Pulmonary sleep apnea , former smoker,    Pulmonary exam normal breath sounds clear to auscultation       Cardiovascular hypertension, Pt. on medications Normal cardiovascular exam Rhythm:Regular Rate:Normal     Neuro/Psych    GI/Hepatic hiatal hernia, GERD  ,  Endo/Other  diabetes, Type 2  Renal/GU      Musculoskeletal   Abdominal (+) + obese (BMI 50.78),   Peds  Hematology  (+) Blood dyscrasia, anemia ,   Anesthesia Other Findings   Reproductive/Obstetrics                            Anesthesia Physical Anesthesia Plan  ASA: 3  Anesthesia Plan: MAC   Post-op Pain Management:    Induction:   PONV Risk Score and Plan: Treatment may vary due to age or medical condition  Airway Management Planned: Natural Airway and Nasal Cannula  Additional Equipment: None  Intra-op Plan:   Post-operative Plan:   Informed Consent: I have reviewed the patients History and Physical, chart, labs and discussed the procedure including the risks, benefits and alternatives for the proposed anesthesia with the patient or authorized representative who has indicated his/her understanding and acceptance.     Dental advisory given  Plan Discussed with: CRNA and Anesthesiologist  Anesthesia Plan Comments:       Anesthesia Quick Evaluation

## 2021-08-23 NOTE — Op Note (Signed)
[facility] Patient Name: Lauren Schmidt Procedure Date: 08/23/2021 MRN: [id number] Attending MD: Arta Silence , MD Date of Birth: 01/04/1961 CSN: [id number] Age: 61 Admit Type: Inpatient Procedure:                Upper GI endoscopy Indications:              Epigastric abdominal pain, Abdominal pain in the                            right upper quadrant, Nausea with vomiting Providers:                Arta Silence, MD, Jaci Carrel, RN, Luan Moore, Technician, Eliberto Ivory Referring MD:             [facility] Medicines:                 Complications:            No immediate complications. Estimated Blood Loss:     Estimated blood loss: none. Procedure:                Pre-Anesthesia Assessment:                           - Prior to the procedure, a History and Physical                            was performed, and patient medications and                            allergies were reviewed. The patient's tolerance of                            previous anesthesia was also reviewed. The risks                            and benefits of the procedure and the sedation                            options and risks were discussed with the patient.                            All questions were answered, and informed consent                            was obtained. Prior Anticoagulants: The patient has                            taken no previous anticoagulant or antiplatelet                            agents. ASA Grade Assessment: III - A patient with  severe systemic disease. After reviewing the risks                            and benefits, the patient was deemed in                            satisfactory condition to undergo the procedure.                           After obtaining informed consent, the endoscope was                            passed under direct vision. Throughout the                             procedure, the patient's blood pressure, pulse, and                            oxygen saturations were monitored continuously. The                            [id number] Olympus endoscope was introduced                            through the mouth, and advanced to the second part                            of duodenum. The upper GI endoscopy was                            accomplished without difficulty. The patient                            tolerated the procedure well. Scope In: Scope Out: Findings:      The examined esophagus was normal.      Patchy mild inflammation was found in the gastric body, in the gastric       antrum and in the prepyloric region of the stomach. Biopsies were taken       with a cold forceps for histology.      The exam of the stomach was otherwise normal.      The duodenal bulb, first portion of the duodenum and second portion of       the duodenum were normal. Impression:               - Normal esophagus.                           - Gastritis. Biopsied.                           - Normal duodenal bulb, first portion of the                            duodenum and second portion of the duodenum. Moderate Sedation:      None Recommendation:           -  Return patient to hospital ward for ongoing care.                           - Soft diet today.                           - Continue present medications.                           - Await pathology results.                           - Perform a RUQ ultrasound at appointment to be                            scheduled.                           [facility] will follow. Procedure Code(s):        --- Professional ---                           [phone], Esophagogastroduodenoscopy, flexible,                            transoral; with biopsy, single or multiple Diagnosis Code(s):        --- Professional ---                           K29.70, Gastritis, unspecified, without bleeding                            R10.13, Epigastric pain                           R10.11, Right upper quadrant pain                           R11.2, Nausea with vomiting, unspecified CPT copyright 2019 [employer]. All rights reserved. The codes documented in this report are preliminary and upon coder review may  be revised to meet current compliance requirements. Arta Silence, MD 08/23/2021 1:19:31 PM This report has been signed electronically. Number of Addenda: 0

## 2021-08-24 ENCOUNTER — Observation Stay (HOSPITAL_COMMUNITY): Payer: 59

## 2021-08-24 ENCOUNTER — Inpatient Hospital Stay (HOSPITAL_COMMUNITY): Payer: 59

## 2021-08-24 DIAGNOSIS — K2970 Gastritis, unspecified, without bleeding: Secondary | ICD-10-CM | POA: Diagnosis present

## 2021-08-24 DIAGNOSIS — Z833 Family history of diabetes mellitus: Secondary | ICD-10-CM | POA: Diagnosis not present

## 2021-08-24 DIAGNOSIS — I2510 Atherosclerotic heart disease of native coronary artery without angina pectoris: Secondary | ICD-10-CM | POA: Diagnosis present

## 2021-08-24 DIAGNOSIS — K660 Peritoneal adhesions (postprocedural) (postinfection): Secondary | ICD-10-CM | POA: Diagnosis not present

## 2021-08-24 DIAGNOSIS — Z01810 Encounter for preprocedural cardiovascular examination: Secondary | ICD-10-CM | POA: Diagnosis not present

## 2021-08-24 DIAGNOSIS — E113293 Type 2 diabetes mellitus with mild nonproliferative diabetic retinopathy without macular edema, bilateral: Secondary | ICD-10-CM | POA: Diagnosis present

## 2021-08-24 DIAGNOSIS — G4733 Obstructive sleep apnea (adult) (pediatric): Secondary | ICD-10-CM | POA: Diagnosis present

## 2021-08-24 DIAGNOSIS — E782 Mixed hyperlipidemia: Secondary | ICD-10-CM | POA: Diagnosis not present

## 2021-08-24 DIAGNOSIS — F32A Depression, unspecified: Secondary | ICD-10-CM | POA: Diagnosis present

## 2021-08-24 DIAGNOSIS — K828 Other specified diseases of gallbladder: Secondary | ICD-10-CM | POA: Diagnosis present

## 2021-08-24 DIAGNOSIS — R1013 Epigastric pain: Secondary | ICD-10-CM | POA: Diagnosis present

## 2021-08-24 DIAGNOSIS — K449 Diaphragmatic hernia without obstruction or gangrene: Secondary | ICD-10-CM | POA: Diagnosis present

## 2021-08-24 DIAGNOSIS — R079 Chest pain, unspecified: Secondary | ICD-10-CM | POA: Diagnosis present

## 2021-08-24 DIAGNOSIS — D509 Iron deficiency anemia, unspecified: Secondary | ICD-10-CM | POA: Diagnosis present

## 2021-08-24 DIAGNOSIS — R1310 Dysphagia, unspecified: Secondary | ICD-10-CM | POA: Diagnosis present

## 2021-08-24 DIAGNOSIS — I10 Essential (primary) hypertension: Secondary | ICD-10-CM | POA: Diagnosis present

## 2021-08-24 DIAGNOSIS — K8010 Calculus of gallbladder with chronic cholecystitis without obstruction: Secondary | ICD-10-CM | POA: Diagnosis present

## 2021-08-24 DIAGNOSIS — K7581 Nonalcoholic steatohepatitis (NASH): Secondary | ICD-10-CM | POA: Diagnosis present

## 2021-08-24 DIAGNOSIS — Z87891 Personal history of nicotine dependence: Secondary | ICD-10-CM | POA: Diagnosis not present

## 2021-08-24 DIAGNOSIS — E1142 Type 2 diabetes mellitus with diabetic polyneuropathy: Secondary | ICD-10-CM | POA: Diagnosis present

## 2021-08-24 DIAGNOSIS — E7800 Pure hypercholesterolemia, unspecified: Secondary | ICD-10-CM | POA: Diagnosis present

## 2021-08-24 DIAGNOSIS — Z8249 Family history of ischemic heart disease and other diseases of the circulatory system: Secondary | ICD-10-CM | POA: Diagnosis not present

## 2021-08-24 DIAGNOSIS — G8929 Other chronic pain: Secondary | ICD-10-CM | POA: Diagnosis present

## 2021-08-24 DIAGNOSIS — K811 Chronic cholecystitis: Secondary | ICD-10-CM | POA: Diagnosis not present

## 2021-08-24 DIAGNOSIS — K219 Gastro-esophageal reflux disease without esophagitis: Secondary | ICD-10-CM | POA: Diagnosis present

## 2021-08-24 DIAGNOSIS — N390 Urinary tract infection, site not specified: Secondary | ICD-10-CM | POA: Diagnosis present

## 2021-08-24 DIAGNOSIS — K565 Intestinal adhesions [bands], unspecified as to partial versus complete obstruction: Secondary | ICD-10-CM | POA: Diagnosis present

## 2021-08-24 DIAGNOSIS — Z6841 Body Mass Index (BMI) 40.0 and over, adult: Secondary | ICD-10-CM | POA: Diagnosis not present

## 2021-08-24 LAB — HEPATIC FUNCTION PANEL
ALT: 35 U/L (ref 0–44)
AST: 32 U/L (ref 15–41)
Albumin: 4 g/dL (ref 3.5–5.0)
Alkaline Phosphatase: 36 U/L — ABNORMAL LOW (ref 38–126)
Bilirubin, Direct: <0.1 mg/dL (ref 0.0–0.2)
Total Bilirubin: 0.8 mg/dL (ref 0.3–1.2)
Total Protein: 8.1 g/dL (ref 6.5–8.1)

## 2021-08-24 LAB — GLUCOSE, CAPILLARY
Glucose-Capillary: 140 mg/dL — ABNORMAL HIGH (ref 70–99)
Glucose-Capillary: 102 mg/dL — ABNORMAL HIGH (ref 70–99)
Glucose-Capillary: 96 mg/dL (ref 70–99)
Glucose-Capillary: 190 mg/dL — ABNORMAL HIGH (ref 70–99)

## 2021-08-24 LAB — SURGICAL PATHOLOGY

## 2021-08-24 MED ORDER — TECHNETIUM TC 99M MEBROFENIN IV KIT
5.5000 | PACK | Freq: Once | INTRAVENOUS | Status: AC
  Administered 2021-08-24: 5.5 via INTRAVENOUS

## 2021-08-24 MED ORDER — ASPIRIN 81 MG PO TBEC
81.0000 mg | DELAYED_RELEASE_TABLET | Freq: Every day | ORAL | Status: DC
  Administered 2021-08-24 – 2021-08-28 (×5): 81 mg via ORAL
  Filled 2021-08-24 (×5): qty 1

## 2021-08-24 MED ORDER — ROSUVASTATIN CALCIUM 20 MG PO TABS
20.0000 mg | ORAL_TABLET | Freq: Every day | ORAL | Status: DC
  Administered 2021-08-24 – 2021-08-28 (×5): 20 mg via ORAL
  Filled 2021-08-24 (×5): qty 1

## 2021-08-24 NOTE — Progress Notes (Signed)
PROGRESS NOTE    Lauren Schmidt  [id number] DOB: October 21, 1960 DOA: 08/21/2021 PCP: No primary care provider on file.   Brief Narrative: 61 year old with past medical history significant for hypertension, hyperlipidemia, diabetes type 2, morbid obesity presented with epigastric midsternal chest pain that is started 3 weeks prior to admission.  She initially presented to the ED 08/09/2021 and her work-up was negative.  She was discharged on Protonix and Carafate with follow-up with [facility].  Barium swallow was ordered 5/9 that was negative.  She presented with worsening symptoms.  Initial troponin was 69 and subsequently down to 3.  Cardiology was consulted and patient underwent coronary CT results pending.  GI has been consulted, and is awaiting for cardiology clearance to proceed with endoscopy.    Assessment & Plan:   Principal Problem:   Chest pain Active Problems:   Obesity, Class III, BMI 40-49.9 (morbid obesity) (HCC)   HLD (hyperlipidemia)   HTN (hypertension)   DM2 (diabetes mellitus, type 2) (HCC)   Epigastric pain  1-Chest pain/epigastric pain Mild  elevation of troponin initially at 69 down to 3. Cardiology consulted and patient underwent coronary CTA  Recent barium swallow outpatient was negative Continue with PPI, Carafate. ECHO: Normal ejection fraction CT Coronary ; Moderate coronary diseases. Cardiology recommend medical management  Endoscopy: mild gastritis. GI recommend RUQ [place]. Which showed top normal caliber common bile duct.  -Hida scan order, depending on results might need Surgery evaluation.  -requiring IV morphine for pain controlled.   2-Hypertension: Continue with metoprolol and lisinopril  3-Diabetes type 2 Continue with Farxiga Sliding scale insulin  3-Morbid  obesity: Needs lifestyle modification  4-HLD; continue with fenofibrate, Zocor.  UTI: continue with ceftriaxone Urine culture insignificant growth.  Completed 3 days antibiotics  5/19      Estimated body mass index is 50.78 kg/m as calculated from the following:   Height as of this encounter: 5' (1.524 m).   Weight as of this encounter: 117.9 kg.   DVT prophylaxis: Lovenox Code Status: Full code Family Communication: Care discussed with patient Disposition Plan:  Status is: Observation The patient remains OBS appropriate and will d/c before 2 midnights.    Consultants:  GI Cardiology   Procedures:  Coronary CT  Antimicrobials:    Subjective: She is still complaining of epigastric pain, pain on palpation.  Poor oral intake.    Objective: Vitals:   08/23/21 2212 08/24/21 0621 08/24/21 1209 08/24/21 1214  BP: (!) 155/66 (!) 164/78 (!) 160/66   Pulse: 66 60 63   Resp: 14 16 16    Temp: 98.2 F (36.8 C) 97.7 F (36.5 C)  98.3 F (36.8 C)  TempSrc: Oral Oral  Oral  SpO2: 94% 99% 98%   Weight:      Height:       No intake or output data in the 24 hours ending 08/24/21 1448  Filed Weights   08/21/21 1037  Weight: 117.9 kg    Examination:  General exam: NAD Respiratory system: CTA Cardiovascular system: S 1, S 2 RRR Gastrointestinal system: BS present, soft nt Central nervous system: Alert, conversant.  Extremities: No edema   Data Reviewed: I have personally reviewed following labs and imaging studies  CBC: Recent Labs  Lab 08/21/21 0523 08/21/21 0951 08/22/21 0748 08/23/21 0406  WBC 6.2 5.3 4.9 4.2  HGB 10.7* 10.1* 11.0* 10.5*  HCT 36.0 32.0* 35.8* 34.6*  MCV 85.5 83.3 85.9 86.3  PLT 404* 360 346 [id number]    Basic Metabolic Panel:  Recent Labs  Lab 08/21/21 0523 08/21/21 0951 08/22/21 0717 08/23/21 0406  NA 138  --  139 140  K 4.2  --  3.8 3.7  CL 103  --  104 106  CO2 26  --  26 25  GLUCOSE 99  --  111* 115*  BUN 18  --  12 11  CREATININE 0.79 0.75 0.80 0.77  CALCIUM 9.3  --  8.8* 8.7*    GFR: Estimated Creatinine Clearance: 85.8 mL/min (by C-G formula based on SCr of 0.77 mg/dL). Liver Function  Tests: Recent Labs  Lab 08/21/21 0523 08/24/21 0946  AST 35 32  ALT 25 35  ALKPHOS 33* 36*  BILITOT 0.7 0.8  PROT 7.9 8.1  ALBUMIN 4.1 4.0    Recent Labs  Lab 08/21/21 0523  LIPASE 35    No results for input(s): AMMONIA in the last 168 hours. Coagulation Profile: No results for input(s): INR, PROTIME in the last 168 hours. Cardiac Enzymes: Recent Labs  Lab 08/21/21 0706  CKTOTAL 52    BNP (last 3 results) No results for input(s): PROBNP in the last 8760 hours. HbA1C: Recent Labs    08/21/21 2157  HGBA1C 6.4*    CBG: Recent Labs  Lab 08/23/21 1613 08/23/21 1758 08/23/21 2203 08/24/21 0712 08/24/21 1133  GLUCAP 145* 121* 115* 140* 102*    Lipid Profile: Recent Labs    08/22/21 0358  CHOL 147  HDL 14*  LDLCALC 93  TRIG 199*  CHOLHDL 10.5    Thyroid Function Tests: No results for input(s): TSH, T4TOTAL, FREET4, T3FREE, THYROIDAB in the last 72 hours. Anemia Panel: No results for input(s): VITAMINB12, FOLATE, FERRITIN, TIBC, IRON, RETICCTPCT in the last 72 hours. Sepsis Labs: No results for input(s): PROCALCITON, LATICACIDVEN in the last 168 hours.  Recent Results (from the past 240 hour(s))  Urine Culture     Status: Abnormal   Collection Time: 08/22/21  5:27 PM   Specimen: In/Out Cath Urine  Result Value Ref Range Status   Specimen Description   Final    IN/OUT CATH URINE Performed at [facility] [address] [place], [place] [address]    Special Requests   Final    NONE Performed at [facility], [place] [address] [place], [place] [address]    Culture (A)  Final    <10,000 COLONIES/mL INSIGNIFICANT GROWTH Performed at [facility] [address] [place], [place] [address]    Report Status 08/23/2021 FINAL  Final         Radiology Studies: US Abdomen Limited RUQ (LIVER/GB)  Result Date: 08/24/2021 CLINICAL DATA:  Abdominal pain. EXAM: ULTRASOUND ABDOMEN LIMITED RIGHT UPPER  QUADRANT COMPARISON:  CT imaging from Aug 21, 2021. FINDINGS: Gallbladder: Small amount of sludge in the dependent gallbladder. No gallbladder distension or wall thickening. No pericholecystic fluid. Common bile duct: Diameter: 6 mm Liver: Moderate hepatic steatosis. Fatty sparing near the gallbladder fossa. Portal vein is patent on color Doppler imaging with normal direction of blood flow towards the liver. 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 IMPRESSION: Top-normal caliber of the common bile duct. No signs of cholelithiasis or acute biliary process otherwise. Correlate with laboratory values and clinical presentation to determine whether MRI/MRCP may be warranted. Moderate hepatic steatosis. RIGHT renal angiomyolipomas. Electronically Signed   By: Zetta Bills M.D.   On: 08/24/2021 08:47        Scheduled Meds:  aspirin EC  81 mg Oral Daily  dapagliflozin propanediol  5 mg Oral QHS   enoxaparin (LOVENOX) injection  40 mg Subcutaneous Q24H   feeding supplement  237 mL Oral BID BM   fenofibrate  160 mg Oral Daily   insulin aspart  0-15 Units Subcutaneous TID WC   insulin aspart  0-5 Units Subcutaneous QHS   lisinopril  20 mg Oral Daily   metoprolol succinate  50 mg Oral Daily   pantoprazole  40 mg Oral BID   rosuvastatin  20 mg Oral Daily   sertraline  200 mg Oral Daily   sucralfate  1 g Oral BID   Continuous Infusions:  lactated ringers 10 mL/hr at 08/23/21 1152     LOS: 0 days    Time spent: 35 minutes.     Elmarie Shiley, MD [facility]   If 7PM-7AM, please contact night-coverage [email or web address]  08/24/2021, 2:48 PM

## 2021-08-24 NOTE — Progress Notes (Signed)
Subjective: Ongoing abdominal pain.  Objective: Vital signs in last 24 hours: Temp:  [97.7 F (36.5 C)-98.4 F (36.9 C)] 97.7 F (36.5 C) (05/19 0621) Pulse Rate:  [60-71] 60 (05/19 0621) Resp:  [10-18] 16 (05/19 0621) BP: (143-164)/(59-87) 164/78 (05/19 0621) SpO2:  [94 %-100 %] 99 % (05/19 0621) Weight change:  Last BM Date : 08/21/21  PE: GEN:  NAD, obese HEENT:  [place]/AT, anicteric NEURO:  A/O, no encephalopathy ABD:  Soft, epigastric tenderness with voluntary guarding, no peritonitis  Lab Results: CBC    Component Value Date/Time   WBC 4.2 08/23/2021 0406   RBC 4.01 08/23/2021 0406   HGB 10.5 (L) 08/23/2021 0406   HCT 34.6 (L) 08/23/2021 0406   PLT 317 08/23/2021 0406   MCV 86.3 08/23/2021 0406   MCH 26.2 08/23/2021 0406   MCHC 30.3 08/23/2021 0406   RDW 14.8 08/23/2021 0406   LYMPHSABS 2.4 08/09/2021 0607   MONOABS 0.5 08/09/2021 0607   EOSABS 0.2 08/09/2021 0607   BASOSABS 0.1 08/09/2021 0607  CMP     Component Value Date/Time   NA 140 08/23/2021 0406   K 3.7 08/23/2021 0406   CL 106 08/23/2021 0406   CO2 25 08/23/2021 0406   GLUCOSE 115 (H) 08/23/2021 0406   BUN 11 08/23/2021 0406   CREATININE 0.77 08/23/2021 0406   CALCIUM 8.7 (L) 08/23/2021 0406   PROT 8.1 08/24/2021 0946   ALBUMIN 4.0 08/24/2021 0946   AST 32 08/24/2021 0946   ALT 35 08/24/2021 0946   ALKPHOS 36 (L) 08/24/2021 0946   BILITOT 0.8 08/24/2021 0946   GFRNONAA >60 08/23/2021 0406   GFRAA >60 10/18/2016 1454   Assessment:   Ongoing lower chest and EG/RUQ abdominal pain.  Unrevealing endoscopy (gastritis seen was mild and would not account for patient's symptoms), CT scan, labs.  Cardiac work-up unrevealing.  Sludge seen on U/S and patient's symptoms not inconsistent with gallbladder process.  Plan:   HIDA scan in process. Pending HIDA scan results, possible surgical consultation for consideration of cholecystectomy. PPI, IVF, judicious analgesics, antiemetics.  [facility] will  follow.   Landry Dyke 08/24/2021, 11:02 AM   Cell [phone] If no answer or after 5 PM call [phone]

## 2021-08-24 NOTE — TOC Initial Note (Signed)
Transition of Care [facility]) - Initial/Assessment Note    Patient Details  Name: Lauren Schmidt MRN: [id number] Date of Birth: April 25, 1960  Transition of [facility]) CM/SW Contact:    Golda Acre, RN Phone Number: 08/24/2021, 10:32 AM  Clinical Narrative:                  Transition of Care [facility]) Screening Note   Patient Details  Name: Lauren Schmidt Date of Birth: 1961-03-29   Transition of Care [facility]) CM/SW Contact:    Golda Acre, RN Phone Number: 08/24/2021, 10:32 AM    Transition of Care Department (TOC) has reviewed patient and no TOC needs have been identified at this time. We will continue to monitor patient advancement through interdisciplinary progression rounds. If new patient transition needs arise, please place a TOC consult.    Expected Discharge Plan: Home/Self Care Barriers to Discharge: No Barriers Identified   Patient Goals and CMS Choice Patient states their goals for this hospitalization and ongoing recovery are:: to return home CMS Medicare.gov Compare Post Acute Care list provided to:: Patient    Expected Discharge Plan and Services Expected Discharge Plan: Home/Self Care   Discharge Planning Services: CM Consult   Living arrangements for the past 2 months: Single Family Home                                      Prior Living Arrangements/Services Living arrangements for the past 2 months: Single Family Home Lives with:: Self Patient language and need for interpreter reviewed:: Yes Do you feel safe going back to the place where you live?: Yes            Criminal Activity/Legal Involvement Pertinent to Current Situation/Hospitalization: No - Comment as needed  Activities of Daily Living Home Assistive Devices/Equipment: None ADL Screening (condition at time of admission) Patient's cognitive ability adequate to safely complete daily activities?: Yes Is the patient deaf or have difficulty hearing?: No Does the patient have  difficulty seeing, even when wearing glasses/contacts?: No Does the patient have difficulty concentrating, remembering, or making decisions?: No Patient able to express need for assistance with ADLs?: Yes Does the patient have difficulty dressing or bathing?: No Independently performs ADLs?: Yes (appropriate for developmental age) Does the patient have difficulty walking or climbing stairs?: No Weakness of Legs: None Weakness of Arms/Hands: None  Permission Sought/Granted                  Emotional Assessment Appearance:: Appears stated age     Orientation: : Oriented to Self, Oriented to Place, Oriented to  Time, Oriented to Situation Alcohol / Substance Use: Not Applicable Psych Involvement: No (comment)  Admission diagnosis:  Chest pain [R07.9] Chest pain due to myocardial ischemia, unspecified ischemic chest pain type [I25.9] Patient Active Problem List   Diagnosis Date Noted   Chest pain 08/21/2021   Obesity, Class III, BMI 40-49.9 (morbid obesity) (HCC) 08/21/2021   HLD (hyperlipidemia) 08/21/2021   HTN (hypertension) 08/21/2021   DM2 (diabetes mellitus, type 2) (HCC) 08/21/2021   Epigastric pain 08/21/2021   Ventral hernia with obstruction 07/01/2016   SBO (small bowel obstruction) (HCC) 05/14/2016   Bowel obstruction (HCC) 05/14/2016   Postoperative wound infection 02/06/2013   Umbilical hernia with gangrene and obstruction 01/31/2013   PCP:  No primary care provider on file. Pharmacy:   [facility] - [place], [place] - [phone]  [address] [place] [address] Phone: [phone] Fax: [phone]  [id number] [place], [place] - [address] AT [facility] OF [facility] & [address] [place] [address] Phone: [phone] Fax: [phone]  [facility] [address] [place] [address] Phone: [phone]  Fax: [phone]     Social Determinants of Health (SDOH) Interventions    Readmission Risk Interventions     View : No data to display.

## 2021-08-24 NOTE — Progress Notes (Incomplete)
Progress Note  Patient Name: Lauren Schmidt Date of Encounter: 08/24/2021  [facility] Cardiologist: Freada Bergeron, MD   Subjective   ***  Inpatient Medications    Scheduled Meds:  dapagliflozin propanediol  5 mg Oral QHS   enoxaparin (LOVENOX) injection  40 mg Subcutaneous Q24H   feeding supplement  237 mL Oral BID BM   fenofibrate  160 mg Oral Daily   insulin aspart  0-15 Units Subcutaneous TID WC   insulin aspart  0-5 Units Subcutaneous QHS   lisinopril  20 mg Oral Daily   metoprolol succinate  50 mg Oral Daily   pantoprazole  40 mg Oral BID   sertraline  200 mg Oral Daily   simvastatin  40 mg Oral QPM   sucralfate  1 g Oral BID   Continuous Infusions:  cefTRIAXone (ROCEPHIN)  IV 1 g (08/23/21 1840)   lactated ringers 10 mL/hr at 08/23/21 1152   PRN Meds: acetaminophen, ALPRAZolam, morphine injection, ondansetron (ZOFRAN) IV, prochlorperazine   Vital Signs    Vitals:   08/23/21 1323 08/23/21 1345 08/23/21 2212 08/24/21 0621  BP: (!) 146/66 (!) 155/87 (!) 155/66 (!) 164/78  Pulse: 69 66 66 60  Resp: 10 12 14 16   Temp:  98.4 F (36.9 C) 98.2 F (36.8 C) 97.7 F (36.5 C)  TempSrc:  Oral Oral Oral  SpO2: 97% 99% 94% 99%  Weight:      Height:        Intake/Output Summary (Last 24 hours) at 08/24/2021 1013 Last data filed at 08/23/2021 1430 Gross per 24 hour  Intake 640 ml  Output --  Net 640 ml      08/21/2021   10:37 AM 08/09/2021    6:04 AM 03/31/2018    8:25 AM  Last 3 Weights  Weight (lbs) 260 lb 260 lb 250 lb  Weight (kg) 117.935 kg 117.935 kg 113.399 kg      Telemetry    SR - Personally Reviewed  ECG    No new - Personally Reviewed  Physical Exam  *** GEN: No acute distress.   Neck: No JVD Cardiac: RRR, no murmurs, rubs, or gallops.  Respiratory: Clear to auscultation bilaterally. GI: Soft, nontender, non-distended  MS: No edema; No deformity. Neuro:  Nonfocal  Psych: Normal affect   Labs    High Sensitivity Troponin:    Recent Labs  Lab 08/21/21 0523 08/21/21 0706  TROPONINIHS 69* 3     Chemistry Recent Labs  Lab 08/21/21 0523 08/21/21 0951 08/22/21 0717 08/23/21 0406  NA 138  --  139 140  K 4.2  --  3.8 3.7  CL 103  --  104 106  CO2 26  --  26 25  GLUCOSE 99  --  111* 115*  BUN 18  --  12 11  CREATININE 0.79 0.75 0.80 0.77  CALCIUM 9.3  --  8.8* 8.7*  PROT 7.9  --   --   --   ALBUMIN 4.1  --   --   --   AST 35  --   --   --   ALT 25  --   --   --   ALKPHOS 33*  --   --   --   BILITOT 0.7  --   --   --   GFRNONAA >60 >60 >60 >60  ANIONGAP 9  --  9 9    Lipids  Recent Labs  Lab 08/22/21 0358  CHOL 147  TRIG 199*  HDL 14*  LDLCALC 93  CHOLHDL 10.5    Hematology Recent Labs  Lab 08/21/21 0951 08/22/21 0748 08/23/21 0406  WBC 5.3 4.9 4.2  RBC 3.84* 4.17 4.01  HGB 10.1* 11.0* 10.5*  HCT 32.0* 35.8* 34.6*  MCV 83.3 85.9 86.3  MCH 26.3 26.4 26.2  MCHC 31.6 30.7 30.3  RDW 15.0 15.1 14.8  PLT 360 346 317   Thyroid No results for input(s): TSH, FREET4 in the last 168 hours.  BNPNo results for input(s): BNP, PROBNP in the last 168 hours.  DDimer No results for input(s): DDIMER in the last 168 hours.   Radiology    CT CORONARY MORPH W/CTA COR W/SCORE W/CA W/CM &/OR WO/CM  Addendum Date: 08/22/2021   ADDENDUM REPORT: 08/22/2021 14:32 HISTORY: Chest pain/anginal equiv, intermediate CAD risk, not treadmill candidate EXAM: Cardiac/Coronary  CT TECHNIQUE: The patient was scanned on a [employer]. PROTOCOL: A 120 kV prospective scan was triggered in the descending thoracic aorta at 111 HU's. Axial non-contrast 3 mm slices were carried out through the heart. The data set was analyzed on a dedicated work station and scored using the Agatston method. Gantry rotation speed was 250 msecs and collimation was .6 mm. Beta blockade and 0.8 mg of sl NTG was given. The 3D data set was reconstructed in 5% intervals of the 35-75 % of the R-R cycle. Systolic and diastolic phases were  analyzed on a dedicated work station using MPR, MIP and VRT modes. The patient received 136mL OMNIPAQUE IOHEXOL 350 MG/ML SOLN contrast. FINDINGS: Image quality: Poor Noise artifact is: Moderate, signal to noise ratio is reduced. Coronary calcium score is 107, which places the patient in the 87th percentile for age and sex matched control. Coronary arteries: Normal coronary origins.  Right dominance. Right Coronary Artery: Mild mixed atherosclerotic plaque in the proximal RCA, 25-49% stenosis. Moderate atherosclerotic plaque in the mid and distal RCA, 50-69% stenosis. Left Main Coronary Artery: No detectable plaque or stenosis. Left Anterior Descending Coronary Artery: Minimal scattered mixed atherosclerotic plaque in the LAD, <25% stenosis. Moderate atherosclerotic plaque in the distal LAD, 50-69% stenosis. Image quality is suboptimal to definitely assess lumen, probable moderate stenosis in the proximal first diagonal artery, 50-69% stenosis. Left Circumflex Artery: Small first OM with calcified plaque, may have stenosis but unable to determine definitively. No other detectable plaque or stenosis in LCx. Aorta: Normal size, 30 mm at the mid ascending aorta (level of the PA bifurcation) measured double oblique. No calcifications. No dissection. Aortic Valve: No calcifications. Other findings: Normal pulmonary vein drainage into the left atrium. Normal left atrial appendage without thrombus. Normal size of the pulmonary artery. IMPRESSION: 1. 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 CT FFR will be performed and reported separately. 2. Coronary calcium score is 107, which places the patient in the [place] percentile for age and sex matched control. 3. Normal coronary origins with right dominance. Electronically Signed   By: Cherlynn Kaiser M.D.   On: 08/22/2021 14:32   Result Date: 08/22/2021 EXAM: OVER-READ INTERPRETATION  CT CHEST The following report is a  limited chest CT over-read performed by radiologist Dr. Abigail Miyamoto of [facility], [place] on 08/22/2021. This over-read does not include interpretation of cardiac or coronary anatomy or pathology. The coronary CTA interpretation by the cardiologist is attached. COMPARISON:  CTA chest of 1 day prior FINDINGS: Vascular: Aortic atherosclerosis. No central pulmonary embolism, on this non-dedicated study. Mediastinum/Nodes: No  imaged thoracic adenopathy. Lungs/Pleura: No pleural fluid. Tiny pulmonary nodules were detailed on yesterday's dedicated diagnostic chest CT. No superimposed acute lobar consolidation. Upper Abdomen: Hepatic steatosis. Normal imaged portions of the spleen, stomach. Musculoskeletal: No acute osseous abnormality. Lower thoracic spondylosis. IMPRESSION: No acute findings in the imaged extracardiac chest. Aortic Atherosclerosis (ICD10-I70.0). Hepatic steatosis. Electronically Signed: By: Abigail Miyamoto M.D. On: 08/22/2021 11:53   CT CORONARY FRACTIONAL FLOW RESERVE DATA PREP  Result Date: 08/22/2021 EXAM: CT FFR ANALYSIS CLINICAL DATA:  abnormal coronary CT FINDINGS: FFRct analysis was performed on the original cardiac CT angiogram dataset. Diagrammatic representation of the FFRct analysis is provided in a separate PDF document in PACS. This dictation was created using the PDF document and an interactive 3D model of the results. 3D model is not available in the EMR/PACS. Normal FFR range is >0.80. Indeterminate (grey) zone is 0.76-0.80. FFR delta of 0.13 is considered significant. 1. Left Main: FFR = 0.98 2. LAD: Proximal FFR = 0.94, mid FFR = 0.90, Distal FFR = 0.56, first diagonal FFR = 0.81 3. LCX: Proximal FFR = 0.96, distal FFR = 0.93, OM1 FFR = 0.80 4. RCA: Proximal FFR = 0.96, mid FFR =0.93, Distal FFR = 0.81 IMPRESSION: 1. 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 The vessel is small caliber <22mm in this location. No other hemodynamically significant  stenoses. RECOMMENDATIONS: Guideline-directed medical therapy and aggressive risk factor modification for secondary prevention of coronary artery disease. Electronically Signed   By: Cherlynn Kaiser M.D.   On: 08/22/2021 14:41   US Abdomen Limited RUQ (LIVER/GB)  Result Date: 08/24/2021 CLINICAL DATA:  Abdominal pain. EXAM: ULTRASOUND ABDOMEN LIMITED RIGHT UPPER QUADRANT COMPARISON:  CT imaging from Aug 21, 2021. FINDINGS: Gallbladder: Small amount of sludge in the dependent gallbladder. No gallbladder distension or wall thickening. No pericholecystic fluid. Common bile duct: Diameter: 6 mm Liver: Moderate hepatic steatosis. Fatty sparing near the gallbladder fossa. Portal vein is patent on color Doppler imaging with normal direction of blood flow towards the liver. 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 IMPRESSION: Top-normal caliber of the common bile duct. No signs of cholelithiasis or acute biliary process otherwise. Correlate with laboratory values and clinical presentation to determine whether MRI/MRCP may be warranted. Moderate hepatic steatosis. RIGHT renal angiomyolipomas. Electronically Signed   By: Zetta Bills M.D.   On: 08/24/2021 08:47    Cardiac Studies   TTE 08/21/21: IMPRESSIONS     1. Left ventricular ejection fraction, by estimation, is 60 to 65%. The  left ventricle has normal function. The left ventricle has no regional  wall motion abnormalities. Left ventricular diastolic parameters are  indeterminate.   2. Right ventricular systolic function is normal. The right ventricular  size is normal.   3. Left atrial size was mildly dilated.   4. The mitral valve is normal in structure. No evidence of mitral valve  regurgitation. No evidence of mitral stenosis.   5. The aortic valve is normal in structure. Aortic valve regurgitation is  not visualized. Aortic valve sclerosis is present, with no evidence of  aortic valve stenosis.   6. The inferior vena cava is  normal in size with greater than 50%  respiratory variability, suggesting right atrial pressure of 3 mmHg.   Patient Profile     61 y.o. female history of HTN, HLD, DMII, GERD, history of bowel obstruction, and morbid obesity who presents to the hospital with persistent chest pain x3 weeks found to have  trop 69>3  Assessment & Plan    #Chest Pain: #Mild Trop Elevation: Patient presents with 3 weeks history of atypical chest pain found to have trop 69>3. ECG without ischemic changes. TTE with normal EF 60-65%, normal RV, no significant valve disease. Coronary CTA pending. Overall, do not suspect cardiac etiology of symptoms. Agree with ongoing GI work-up.  -coronary CTA with stenosis in distal LAD which we will treat medically.      #Epigastric Pain: Seen by GI and recommended for EGD. Awaiting coronary CTA results.   #HTN: -Continue metop 50mg  XL and lisinopril 20mg  daily   #DMII: -Continue farxiga 5mg  daily   #HLD: -Continue fenofibrate -Continue simvastatin 40mg  daily       For questions or updates, please contact [facility] Please consult [email or web address] for contact info under        Signed, Cecilie Kicks, NP  08/24/2021, 10:13 AM

## 2021-08-24 NOTE — Progress Notes (Signed)
Progress Note  Patient Name: Lauren Schmidt Date of Encounter: 08/24/2021  [facility] Cardiologist: Freada Bergeron, MD   Subjective   Coronary CTA with moderate disease in mid-distal RCA and flow limiting disease in distal LAD. Recommended for medical management.  Underwent ECG which showed mild gastritis.  Inpatient Medications    Scheduled Meds:  dapagliflozin propanediol  5 mg Oral QHS   enoxaparin (LOVENOX) injection  40 mg Subcutaneous Q24H   feeding supplement  237 mL Oral BID BM   fenofibrate  160 mg Oral Daily   insulin aspart  0-15 Units Subcutaneous TID WC   insulin aspart  0-5 Units Subcutaneous QHS   lisinopril  20 mg Oral Daily   metoprolol succinate  50 mg Oral Daily   pantoprazole  40 mg Oral BID   sertraline  200 mg Oral Daily   simvastatin  40 mg Oral QPM   sucralfate  1 g Oral BID   Continuous Infusions:  cefTRIAXone (ROCEPHIN)  IV 1 g (08/23/21 1840)   lactated ringers 10 mL/hr at 08/23/21 1152   PRN Meds: acetaminophen, ALPRAZolam, morphine injection, ondansetron (ZOFRAN) IV, prochlorperazine   Vital Signs    Vitals:   08/23/21 1323 08/23/21 1345 08/23/21 2212 08/24/21 0621  BP: (!) 146/66 (!) 155/87 (!) 155/66 (!) 164/78  Pulse: 69 66 66 60  Resp: 10 12 14 16   Temp:  98.4 F (36.9 C) 98.2 F (36.8 C) 97.7 F (36.5 C)  TempSrc:  Oral Oral Oral  SpO2: 97% 99% 94% 99%  Weight:      Height:        Intake/Output Summary (Last 24 hours) at 08/24/2021 1056 Last data filed at 08/23/2021 1430 Gross per 24 hour  Intake 640 ml  Output --  Net 640 ml       08/21/2021   10:37 AM 08/09/2021    6:04 AM 03/31/2018    8:25 AM  Last 3 Weights  Weight (lbs) 260 lb 260 lb 250 lb  Weight (kg) 117.935 kg 117.935 kg 113.399 kg      Telemetry    NSR/sinus bradycardia - Personally Reviewed  ECG    No new tracing - Personally Reviewed  Physical Exam   GEN: No acute distress.   Neck: No JVD Cardiac: RRR, no murmurs, rubs, or gallops.   Respiratory: Clear to auscultation bilaterally. GI: Obese, mildly TTP in epigastric region MS: No edema; No deformity. Neuro:  Nonfocal  Psych: Normal affect   Labs    High Sensitivity Troponin:   Recent Labs  Lab 08/21/21 0523 08/21/21 0706  TROPONINIHS 69* 3      Chemistry Recent Labs  Lab 08/21/21 0523 08/21/21 0951 08/22/21 0717 08/23/21 0406 08/24/21 0946  NA 138  --  139 140  --   K 4.2  --  3.8 3.7  --   CL 103  --  104 106  --   CO2 26  --  26 25  --   GLUCOSE 99  --  111* 115*  --   BUN 18  --  12 11  --   CREATININE 0.79 0.75 0.80 0.77  --   CALCIUM 9.3  --  8.8* 8.7*  --   PROT 7.9  --   --   --  8.1  ALBUMIN 4.1  --   --   --  4.0  AST 35  --   --   --  32  ALT 25  --   --   --  35  ALKPHOS 33*  --   --   --  36*  BILITOT 0.7  --   --   --  0.8  GFRNONAA >60 >60 >60 >60  --   ANIONGAP 9  --  9 9  --      Lipids  Recent Labs  Lab 08/22/21 0358  CHOL 147  TRIG 199*  HDL 14*  LDLCALC 93  CHOLHDL 10.5     Hematology Recent Labs  Lab 08/21/21 0951 08/22/21 0748 08/23/21 0406  WBC 5.3 4.9 4.2  RBC 3.84* 4.17 4.01  HGB 10.1* 11.0* 10.5*  HCT 32.0* 35.8* 34.6*  MCV 83.3 85.9 86.3  MCH 26.3 26.4 26.2  MCHC 31.6 30.7 30.3  RDW 15.0 15.1 14.8  PLT 360 346 317    Thyroid No results for input(s): TSH, FREET4 in the last 168 hours.  BNPNo results for input(s): BNP, PROBNP in the last 168 hours.  DDimer No results for input(s): DDIMER in the last 168 hours.   Radiology    CT CORONARY MORPH W/CTA COR W/SCORE W/CA W/CM &/OR WO/CM  Addendum Date: 08/22/2021   ADDENDUM REPORT: 08/22/2021 14:32 HISTORY: Chest pain/anginal equiv, intermediate CAD risk, not treadmill candidate EXAM: Cardiac/Coronary  CT TECHNIQUE: The patient was scanned on a [employer]. PROTOCOL: A 120 kV prospective scan was triggered in the descending thoracic aorta at 111 HU's. Axial non-contrast 3 mm slices were carried out through the heart. The data set was  analyzed on a dedicated work station and scored using the Agatston method. Gantry rotation speed was 250 msecs and collimation was .6 mm. Beta blockade and 0.8 mg of sl NTG was given. The 3D data set was reconstructed in 5% intervals of the 35-75 % of the R-R cycle. Systolic and diastolic phases were analyzed on a dedicated work station using MPR, MIP and VRT modes. The patient received 149mL OMNIPAQUE IOHEXOL 350 MG/ML SOLN contrast. FINDINGS: Image quality: Poor Noise artifact is: Moderate, signal to noise ratio is reduced. Coronary calcium score is 107, which places the patient in the 87th percentile for age and sex matched control. Coronary arteries: Normal coronary origins.  Right dominance. Right Coronary Artery: Mild mixed atherosclerotic plaque in the proximal RCA, 25-49% stenosis. Moderate atherosclerotic plaque in the mid and distal RCA, 50-69% stenosis. Left Main Coronary Artery: No detectable plaque or stenosis. Left Anterior Descending Coronary Artery: Minimal scattered mixed atherosclerotic plaque in the LAD, <25% stenosis. Moderate atherosclerotic plaque in the distal LAD, 50-69% stenosis. Image quality is suboptimal to definitely assess lumen, probable moderate stenosis in the proximal first diagonal artery, 50-69% stenosis. Left Circumflex Artery: Small first OM with calcified plaque, may have stenosis but unable to determine definitively. No other detectable plaque or stenosis in LCx. Aorta: Normal size, 30 mm at the mid ascending aorta (level of the PA bifurcation) measured double oblique. No calcifications. No dissection. Aortic Valve: No calcifications. Other findings: Normal pulmonary vein drainage into the left atrium. Normal left atrial appendage without thrombus. Normal size of the pulmonary artery. IMPRESSION: 1. 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 CT FFR will be performed and reported separately. 2. Coronary calcium score is  107, which places the patient in the [place] percentile for age and sex matched control. 3. Normal coronary origins with right dominance. Electronically Signed   By: Cherlynn Kaiser M.D.   On: 08/22/2021 14:32   Result Date: 08/22/2021 EXAM: OVER-READ INTERPRETATION  CT CHEST The following report is a limited chest CT over-read performed by radiologist Dr. Abigail Miyamoto of [facility], [place] on 08/22/2021. This over-read does not include interpretation of cardiac or coronary anatomy or pathology. The coronary CTA interpretation by the cardiologist is attached. COMPARISON:  CTA chest of 1 day prior FINDINGS: Vascular: Aortic atherosclerosis. No central pulmonary embolism, on this non-dedicated study. Mediastinum/Nodes: No imaged thoracic adenopathy. Lungs/Pleura: No pleural fluid. Tiny pulmonary nodules were detailed on yesterday's dedicated diagnostic chest CT. No superimposed acute lobar consolidation. Upper Abdomen: Hepatic steatosis. Normal imaged portions of the spleen, stomach. Musculoskeletal: No acute osseous abnormality. Lower thoracic spondylosis. IMPRESSION: No acute findings in the imaged extracardiac chest. Aortic Atherosclerosis (ICD10-I70.0). Hepatic steatosis. Electronically Signed: By: Abigail Miyamoto M.D. On: 08/22/2021 11:53   CT CORONARY FRACTIONAL FLOW RESERVE DATA PREP  Result Date: 08/22/2021 EXAM: CT FFR ANALYSIS CLINICAL DATA:  abnormal coronary CT FINDINGS: FFRct analysis was performed on the original cardiac CT angiogram dataset. Diagrammatic representation of the FFRct analysis is provided in a separate PDF document in PACS. This dictation was created using the PDF document and an interactive 3D model of the results. 3D model is not available in the EMR/PACS. Normal FFR range is >0.80. Indeterminate (grey) zone is 0.76-0.80. FFR delta of 0.13 is considered significant. 1. Left Main: FFR = 0.98 2. LAD: Proximal FFR = 0.94, mid FFR = 0.90, Distal FFR = 0.56, first diagonal FFR = 0.81 3.  LCX: Proximal FFR = 0.96, distal FFR = 0.93, OM1 FFR = 0.80 4. RCA: Proximal FFR = 0.96, mid FFR =0.93, Distal FFR = 0.81 IMPRESSION: 1. 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 The vessel is small caliber <39mm in this location. No other hemodynamically significant stenoses. RECOMMENDATIONS: Guideline-directed medical therapy and aggressive risk factor modification for secondary prevention of coronary artery disease. Electronically Signed   By: Cherlynn Kaiser M.D.   On: 08/22/2021 14:41   US Abdomen Limited RUQ (LIVER/GB)  Result Date: 08/24/2021 CLINICAL DATA:  Abdominal pain. EXAM: ULTRASOUND ABDOMEN LIMITED RIGHT UPPER QUADRANT COMPARISON:  CT imaging from Aug 21, 2021. FINDINGS: Gallbladder: Small amount of sludge in the dependent gallbladder. No gallbladder distension or wall thickening. No pericholecystic fluid. Common bile duct: Diameter: 6 mm Liver: Moderate hepatic steatosis. Fatty sparing near the gallbladder fossa. Portal vein is patent on color Doppler imaging with normal direction of blood flow towards the liver. 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 IMPRESSION: Top-normal caliber of the common bile duct. No signs of cholelithiasis or acute biliary process otherwise. Correlate with laboratory values and clinical presentation to determine whether MRI/MRCP may be warranted. Moderate hepatic steatosis. RIGHT renal angiomyolipomas. Electronically Signed   By: Zetta Bills M.D.   On: 08/24/2021 08:47    Cardiac Studies  Coronary CTA 08/22/21: FINDINGS: Image quality: Poor   Noise artifact is: Moderate, signal to noise ratio is reduced.   Coronary calcium score is 107, which places the patient in the 87th percentile for age and sex matched control.   Coronary arteries: Normal coronary origins.  Right dominance.   Right Coronary Artery: Mild mixed atherosclerotic plaque in the proximal RCA, 25-49% stenosis. Moderate atherosclerotic plaque  in the mid and distal RCA, 50-69% stenosis.   Left Main Coronary Artery: No detectable plaque or stenosis.   Left Anterior Descending Coronary Artery: Minimal scattered mixed atherosclerotic plaque in the LAD, <25% stenosis. Moderate atherosclerotic plaque in the distal LAD, 50-69% stenosis. Image quality is  suboptimal to definitely assess lumen, probable moderate stenosis in the proximal first diagonal artery, 50-69% stenosis.   Left Circumflex Artery: Small first OM with calcified plaque, may have stenosis but unable to determine definitively. No other detectable plaque or stenosis in LCx.   Aorta: Normal size, 30 mm at the mid ascending aorta (level of the PA bifurcation) measured double oblique. No calcifications. No dissection.   Aortic Valve: No calcifications.   Other findings:   Normal pulmonary vein drainage into the left atrium.   Normal left atrial appendage without thrombus.   Normal size of the pulmonary artery.   IMPRESSION: 1. 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 CT FFR will be performed and reported separately.   2. Coronary calcium score is 107, which places the patient in the [place] percentile for age and sex matched control.   3. Normal coronary origins with right dominance.  FFR 08/22/21: FINDINGS: FFRct analysis was performed on the original cardiac CT angiogram dataset. Diagrammatic representation of the FFRct analysis is provided in a separate PDF document in PACS. This dictation was created using the PDF document and an interactive 3D model of the results. 3D model is not available in the EMR/PACS. Normal FFR range is >0.80. Indeterminate (grey) zone is 0.76-0.80. FFR delta of 0.13 is considered significant.   1. Left Main: FFR = 0.98   2. LAD: Proximal FFR = 0.94, mid FFR = 0.90, Distal FFR = 0.56, first diagonal FFR = 0.81 3. LCX: Proximal FFR = 0.96, distal FFR = 0.93, OM1 FFR =  0.80 4. RCA: Proximal FFR = 0.96, mid FFR =0.93, Distal FFR = 0.81   IMPRESSION: 1. 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 The vessel is small caliber <46mm in this location. No other hemodynamically significant stenoses.   RECOMMENDATIONS: Guideline-directed medical therapy and aggressive risk factor modification for secondary prevention of coronary artery disease.   TTE 08/21/21: IMPRESSIONS     1. Left ventricular ejection fraction, by estimation, is 60 to 65%. The  left ventricle has normal function. The left ventricle has no regional  wall motion abnormalities. Left ventricular diastolic parameters are  indeterminate.   2. Right ventricular systolic function is normal. The right ventricular  size is normal.   3. Left atrial size was mildly dilated.   4. The mitral valve is normal in structure. No evidence of mitral valve  regurgitation. No evidence of mitral stenosis.   5. The aortic valve is normal in structure. Aortic valve regurgitation is  not visualized. Aortic valve sclerosis is present, with no evidence of  aortic valve stenosis.   6. The inferior vena cava is normal in size with greater than 50%  respiratory variability, suggesting right atrial pressure of 3 mmHg.   Patient Profile     61 y.o. female with history of HTN, HLD, DMII, GERD, history of bowel obstruction, and morbid obesity who presents to the hospital with persistent chest pain x3 weeks found to have trop 69>3 for which Cardiology has been consulted.    Assessment & Plan    #Chest Pain: #Mild Trop Elevation: Patient presents with 3 weeks history of atypical chest pain found to have trop 69>3. ECG without ischemic changes. TTE with normal EF 60-65%, normal RV, no significant valve disease. Coronary CTA with moderate RCA disease and flow limiting disease in distal LAD. Recommended for continued medical therapy.  -Change simva to crestor  with goal LDL<70 -Start ASA  81mg  daily -Continue fenofibtrate 145mg  daily -Continue metop 50mg  XL daily -Will need continued lifestyle modifications   #Epigastric Pain: EGD with mild gastritis. Abdominal ultrasound with top-normal caliber of common bile duct.   -Further work-up per primary and GI  #HTN: -Continue metop 50mg  XL and lisinopril 20mg  daily  #DMII: -Continue farxiga 5mg  daily  #HLD: -Continue fenofibrate -Change simva to crestor with goal LDL<70  Cardiology will sign-off. Will arrange for CV follow-up     For questions or updates, please contact [place] Please consult [email or web address] for contact info under        Signed, Freada Bergeron, MD  08/24/2021, 10:56 AM

## 2021-08-25 ENCOUNTER — Encounter (HOSPITAL_COMMUNITY): Payer: Self-pay | Admitting: Internal Medicine

## 2021-08-25 DIAGNOSIS — R079 Chest pain, unspecified: Secondary | ICD-10-CM | POA: Diagnosis not present

## 2021-08-25 DIAGNOSIS — G479 Sleep disorder, unspecified: Secondary | ICD-10-CM | POA: Diagnosis present

## 2021-08-25 DIAGNOSIS — K219 Gastro-esophageal reflux disease without esophagitis: Secondary | ICD-10-CM | POA: Diagnosis present

## 2021-08-25 DIAGNOSIS — K2970 Gastritis, unspecified, without bleeding: Secondary | ICD-10-CM

## 2021-08-25 DIAGNOSIS — D509 Iron deficiency anemia, unspecified: Secondary | ICD-10-CM | POA: Insufficient documentation

## 2021-08-25 DIAGNOSIS — M720 Palmar fascial fibromatosis [Dupuytren]: Secondary | ICD-10-CM | POA: Insufficient documentation

## 2021-08-25 DIAGNOSIS — E1142 Type 2 diabetes mellitus with diabetic polyneuropathy: Secondary | ICD-10-CM | POA: Diagnosis present

## 2021-08-25 DIAGNOSIS — G4733 Obstructive sleep apnea (adult) (pediatric): Secondary | ICD-10-CM | POA: Diagnosis present

## 2021-08-25 DIAGNOSIS — R1310 Dysphagia, unspecified: Secondary | ICD-10-CM

## 2021-08-25 DIAGNOSIS — E113293 Type 2 diabetes mellitus with mild nonproliferative diabetic retinopathy without macular edema, bilateral: Secondary | ICD-10-CM | POA: Diagnosis present

## 2021-08-25 LAB — CBC
HCT: 34.8 % — ABNORMAL LOW (ref 36.0–46.0)
Hemoglobin: 10.3 g/dL — ABNORMAL LOW (ref 12.0–15.0)
MCH: 25.8 pg — ABNORMAL LOW (ref 26.0–34.0)
MCHC: 29.6 g/dL — ABNORMAL LOW (ref 30.0–36.0)
MCV: 87.2 fL (ref 80.0–100.0)
Platelets: 355 10*3/uL (ref 150–400)
RBC: 3.99 MIL/uL (ref 3.87–5.11)
RDW: 15 % (ref 11.5–15.5)
WBC: 4.5 10*3/uL (ref 4.0–10.5)
nRBC: 0 % (ref 0.0–0.2)

## 2021-08-25 LAB — BASIC METABOLIC PANEL
Anion gap: 11 (ref 5–15)
BUN: 12 mg/dL (ref 8–23)
CO2: 25 mmol/L (ref 22–32)
Calcium: 9.3 mg/dL (ref 8.9–10.3)
Chloride: 105 mmol/L (ref 98–111)
Creatinine, Ser: 0.72 mg/dL (ref 0.44–1.00)
GFR, Estimated: >60 mL/min (ref >60)
Glucose, Bld: 109 mg/dL — ABNORMAL HIGH (ref 70–99)
Potassium: 3.5 mmol/L (ref 3.5–5.1)
Sodium: 141 mmol/L (ref 135–145)

## 2021-08-25 LAB — GLUCOSE, CAPILLARY
Glucose-Capillary: 114 mg/dL — ABNORMAL HIGH (ref 70–99)
Glucose-Capillary: 146 mg/dL — ABNORMAL HIGH (ref 70–99)
Glucose-Capillary: 142 mg/dL — ABNORMAL HIGH (ref 70–99)
Glucose-Capillary: 186 mg/dL — ABNORMAL HIGH (ref 70–99)

## 2021-08-25 MED ORDER — HYDROCODONE-ACETAMINOPHEN 5-325 MG PO TABS
1.0000 | ORAL_TABLET | Freq: Four times a day (QID) | ORAL | Status: DC | PRN
  Administered 2021-08-25 – 2021-08-28 (×4): 1 via ORAL
  Filled 2021-08-25 (×5): qty 1

## 2021-08-25 MED ORDER — TRAMADOL HCL 50 MG PO TABS
50.0000 mg | ORAL_TABLET | Freq: Four times a day (QID) | ORAL | Status: DC | PRN
  Administered 2021-08-25: 50 mg via ORAL
  Filled 2021-08-25: qty 1

## 2021-08-25 MED ORDER — SUCRALFATE 1 GM/10ML PO SUSP
1.0000 g | Freq: Three times a day (TID) | ORAL | Status: DC
  Administered 2021-08-25 – 2021-08-28 (×10): 1 g via ORAL
  Filled 2021-08-25 (×10): qty 10

## 2021-08-25 MED ORDER — ENSURE MAX PROTEIN PO LIQD
11.0000 [oz_av] | Freq: Every day | ORAL | Status: DC
  Administered 2021-08-28: 11 [oz_av] via ORAL
  Filled 2021-08-25 (×4): qty 330

## 2021-08-25 NOTE — Progress Notes (Signed)
PT Cancellation Note  Patient Details Name: Lauren Schmidt MRN: [id number] DOB: [birth year]   Cancelled Treatment:    Reason Eval/Treat Not Completed: PT screened, no needs identified, will sign off. Spoke with pt who denies need for PT services. Will sign off at patient's request.    Doreatha Massed, PT Acute Rehabilitation  Office: [phone] Pager: [phone]

## 2021-08-25 NOTE — Progress Notes (Signed)
Initial Nutrition Assessment  DOCUMENTATION CODES:   Morbid obesity  INTERVENTION:   -Ensure MAX Protein po daily, each supplement provides 150 kcal and 30 grams of protein   NUTRITION DIAGNOSIS:   Inadequate oral intake related to acute illness as evidenced by per patient/family report.  GOAL:   Patient will meet greater than or equal to 90% of their needs  MONITOR:   PO intake, Supplement acceptance, Labs, Weight trends, I & O's  REASON FOR ASSESSMENT:   Malnutrition Screening Tool    ASSESSMENT:   61 year old with past medical history significant for hypertension, hyperlipidemia, diabetes type 2, morbid obesity presented with epigastric midsternal chest pain that is started 3 weeks prior to admission.  Patient currently consuming 75-100% of meals. Pt continues to have some post-prandial pain. GI and surgery now following. Pt reporting dysphagia with solid foods. Work-up for source of pain continues.   Ensure supplements have been ordered, pt not accepting. Will switch to Ensure Max for more protein.  Per weight records, no weight loss noted.  Medications: Carafate  Labs reviewed:  CBGs: 96-190  NUTRITION - FOCUSED PHYSICAL EXAM:  Unable to complete, RD remote.  Diet Order:   Diet Order             DIET SOFT Room service appropriate? Yes; Fluid consistency: Thin  Diet effective now                   EDUCATION NEEDS:   No education needs have been identified at this time  Skin:  Skin Assessment: Reviewed RN Assessment  Last BM:  5/19  Height:   Ht Readings from Last 1 Encounters:  08/21/21 5' (1.524 m)    Weight:   Wt Readings from Last 1 Encounters:  08/21/21 117.9 kg    BMI:  Body mass index is 50.78 kg/m.  Estimated Nutritional Needs:   Kcal:  1600-1800  Protein:  75-90g  Fluid:  1.8L/day  Tilda Franco, MS, RD, LDN Inpatient Clinical Dietitian Contact information available via Amion

## 2021-08-25 NOTE — Progress Notes (Signed)
PROGRESS NOTE    Lauren Schmidt  [id number] DOB: November 10, 1960 DOA: 08/21/2021 PCP: No primary care provider on file.   Brief Narrative: 61 year old with past medical history significant for hypertension, hyperlipidemia, diabetes type 2, morbid obesity presented with epigastric midsternal chest pain that is started 3 weeks prior to admission.  She initially presented to the ED 08/09/2021 and her work-up was negative.  She was discharged on Protonix and Carafate with follow-up with [facility].  Barium swallow was ordered 5/9 that was negative.  She presented with worsening symptoms.  Initial troponin was 69 and subsequently down to 3.  Cardiology was consulted and patient underwent coronary CT results pending.  GI has been consulted, and is awaiting for cardiology clearance to proceed with endoscopy.    Assessment & Plan:   Principal Problem:   Chest pain Active Problems:   Morbid obesity with BMI of 50.0-59.9, adult (HCC)   HLD (hyperlipidemia)   HTN (hypertension)   DM2 (diabetes mellitus, type 2) (HCC)   Abdominal pain, chronic, epigastric   Depressive disorder   Diabetic peripheral neuropathy associated with type 2 diabetes mellitus (HCC)   Dysphagia   Gastroesophageal reflux disease   Obstructive sleep apnea (adult) (pediatric)   Sleep disturbance   Type 2 diabetes mellitus with mild nonproliferative diabetic retinopathy without macular edema, bilateral (HCC)   Gastritis  1-Chest pain/epigastric pain Mild  elevation of troponin initially at 69 down to 3. Cardiology consulted and patient underwent coronary CTA  Recent barium swallow outpatient was negative Continue with PPI, Carafate. ECHO: Normal ejection fraction CT Coronary ; Moderate coronary diseases. Cardiology recommend medical management  Endoscopy: mild gastritis. GI recommend RUQ [place]. Which showed top normal caliber common bile duct.  -Hida scan order, depending on results might need Surgery evaluation.  -tramadol  didn't help with pain. Try vicodin.  Awaiting recommendation from Surgery and GI regarding next step in management.   2-Hypertension: Continue with metoprolol and lisinopril  3-Diabetes type 2 Continue with Farxiga Sliding scale insulin  3-Morbid  obesity: Needs lifestyle modification  4-HLD; continue with fenofibrate, Zocor.  UTI: continue with ceftriaxone Urine culture insignificant growth.  Completed 3 days antibiotics 5/19      Estimated body mass index is 50.78 kg/m as calculated from the following:   Height as of this encounter: 5' (1.524 m).   Weight as of this encounter: 117.9 kg.   DVT prophylaxis: Lovenox Code Status: Full code Family Communication: Care discussed with patient Disposition Plan:  Status is: Observation The patient remains OBS appropriate and will d/c before 2 midnights.    Consultants:  GI Cardiology   Procedures:  Coronary CT  Antimicrobials:    Subjective: She has been able to tolerate soft diet.  She continue to have epigastric pain    Objective: Vitals:   08/24/21 2043 08/25/21 0605 08/25/21 0606 08/25/21 1454  BP: (!) 149/72 (!) 149/69 (!) 149/69 (!) 160/81  Pulse: 68 62 (!) 58 64  Resp: 19 19 19 18   Temp: 98.6 F (37 C) 98 F (36.7 C) 98 F (36.7 C) 98 F (36.7 C)  TempSrc: Oral Oral Oral   SpO2: 94% 94% 94% 97%  Weight:      Height:        Intake/Output Summary (Last 24 hours) at 08/25/2021 1456 Last data filed at 08/25/2021 0915 Gross per 24 hour  Intake 640 ml  Output --  Net 640 ml    Filed Weights   08/21/21 1037  Weight: 117.9 kg  Examination:  General exam: NAD Respiratory system: CTA Cardiovascular system: S 1, S 2 RRR Gastrointestinal system: BS present, soft, nt Central nervous system: alert Extremities: No edema   Data Reviewed: I have personally reviewed following labs and imaging studies  CBC: Recent Labs  Lab 08/21/21 0523 08/21/21 0951 08/22/21 0748 08/23/21 0406  08/25/21 0406  WBC 6.2 5.3 4.9 4.2 4.5  HGB 10.7* 10.1* 11.0* 10.5* 10.3*  HCT 36.0 32.0* 35.8* 34.6* 34.8*  MCV 85.5 83.3 85.9 86.3 87.2  PLT 404* 360 346 317 [id number]    Basic Metabolic Panel: Recent Labs  Lab 08/21/21 0523 08/21/21 0951 08/22/21 0717 08/23/21 0406 08/25/21 0406  NA 138  --  139 140 141  K 4.2  --  3.8 3.7 3.5  CL 103  --  104 106 105  CO2 26  --  26 25 25   GLUCOSE 99  --  111* 115* 109*  BUN 18  --  12 11 12   CREATININE 0.79 0.75 0.80 0.77 0.72  CALCIUM 9.3  --  8.8* 8.7* 9.3    GFR: Estimated Creatinine Clearance: 85.8 mL/min (by C-G formula based on SCr of 0.72 mg/dL). Liver Function Tests: Recent Labs  Lab 08/21/21 0523 08/24/21 0946  AST 35 32  ALT 25 35  ALKPHOS 33* 36*  BILITOT 0.7 0.8  PROT 7.9 8.1  ALBUMIN 4.1 4.0    Recent Labs  Lab 08/21/21 0523  LIPASE 35    No results for input(s): AMMONIA in the last 168 hours. Coagulation Profile: No results for input(s): INR, PROTIME in the last 168 hours. Cardiac Enzymes: Recent Labs  Lab 08/21/21 0706  CKTOTAL 52    BNP (last 3 results) No results for input(s): PROBNP in the last 8760 hours. HbA1C: No results for input(s): HGBA1C in the last 72 hours.  CBG: Recent Labs  Lab 08/24/21 1133 08/24/21 1622 08/24/21 2040 08/25/21 0751 08/25/21 1145  GLUCAP 102* 96 190* 114* 146*    Lipid Profile: No results for input(s): CHOL, HDL, LDLCALC, TRIG, CHOLHDL, LDLDIRECT in the last 72 hours.  Thyroid Function Tests: No results for input(s): TSH, T4TOTAL, FREET4, T3FREE, THYROIDAB in the last 72 hours. Anemia Panel: No results for input(s): VITAMINB12, FOLATE, FERRITIN, TIBC, IRON, RETICCTPCT in the last 72 hours. Sepsis Labs: No results for input(s): PROCALCITON, LATICACIDVEN in the last 168 hours.  Recent Results (from the past 240 hour(s))  Urine Culture     Status: Abnormal   Collection Time: 08/22/21  5:27 PM   Specimen: In/Out Cath Urine  Result Value Ref Range Status    Specimen Description   Final    IN/OUT CATH URINE Performed at [facility] [address] [place], [place] [address]    Special Requests   Final    NONE Performed at [facility], [place] [address], [place], [place] [address]    Culture (A)  Final    <10,000 COLONIES/mL INSIGNIFICANT GROWTH Performed at [facility] [address] [place], [place] [address]    Report Status 08/23/2021 FINAL  Final         Radiology Studies: NM Hepatobiliary Liver Func  Result Date: 08/24/2021 CLINICAL DATA:  Abdominal pain for 3 weeks. Borderline biliary ductal dilatation on recent ultrasound. EXAM: NUCLEAR MEDICINE HEPATOBILIARY IMAGING TECHNIQUE: Sequential images of the abdomen were obtained out to 60 minutes following intravenous administration of radiopharmaceutical. Delayed image was also obtained at 150 minutes. RADIOPHARMACEUTICALS:  5.5 mCi Tc-75m  Choletec IV  COMPARISON:  None Available. FINDINGS: Prompt uptake and biliary excretion of activity by the liver is seen. Gallbladder activity is visualized, consistent with patency of cystic duct. Biliary activity passes into small bowel, consistent with patent common bile duct. IMPRESSION: Normal hepatobiliary scan, demonstrating patency of both the cystic and common bile ducts. Electronically Signed   By: Marlaine Hind M.D.   On: 08/24/2021 14:57   US Abdomen Limited RUQ (LIVER/GB)  Result Date: 08/24/2021 CLINICAL DATA:  Abdominal pain. EXAM: ULTRASOUND ABDOMEN LIMITED RIGHT UPPER QUADRANT COMPARISON:  CT imaging from Aug 21, 2021. FINDINGS: Gallbladder: Small amount of sludge in the dependent gallbladder. No gallbladder distension or wall thickening. No pericholecystic fluid. Common bile duct: Diameter: 6 mm Liver: Moderate hepatic steatosis. Fatty sparing near the gallbladder fossa. Portal vein is patent on color Doppler imaging with normal direction of blood flow towards the liver. 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 IMPRESSION: Top-normal caliber of the common bile duct. No signs of cholelithiasis or acute biliary process otherwise. Correlate with laboratory values and clinical presentation to determine whether MRI/MRCP may be warranted. Moderate hepatic steatosis. RIGHT renal angiomyolipomas. Electronically Signed   By: Zetta Bills M.D.   On: 08/24/2021 08:47        Scheduled Meds:  aspirin EC  81 mg Oral Daily   dapagliflozin propanediol  5 mg Oral QHS   enoxaparin (LOVENOX) injection  40 mg Subcutaneous Q24H   feeding supplement  237 mL Oral BID BM   fenofibrate  160 mg Oral Daily   insulin aspart  0-15 Units Subcutaneous TID WC   insulin aspart  0-5 Units Subcutaneous QHS   lisinopril  20 mg Oral Daily   metoprolol succinate  50 mg Oral Daily   pantoprazole  40 mg Oral BID   rosuvastatin  20 mg Oral Daily   sertraline  200 mg Oral Daily   sucralfate  1 g Oral TID WC & HS   Continuous Infusions:  lactated ringers 10 mL/hr at 08/23/21 1152     LOS: 1 day    Time spent: 35 minutes.     Elmarie Shiley, MD [facility]   If 7PM-7AM, please contact night-coverage [email or web address]  08/25/2021, 2:56 PM

## 2021-08-25 NOTE — Progress Notes (Signed)
Subjective: Ongoing epigastric and RUQ pain.  Objective: Vital signs in last 24 hours: Temp:  [98 F (36.7 C)-98.7 F (37.1 C)] 98 F (36.7 C) (05/20 0606) Pulse Rate:  [58-82] 58 (05/20 0606) Resp:  [16-19] 19 (05/20 0606) BP: (149-165)/(66-72) 149/69 (05/20 0606) SpO2:  [94 %-98 %] 94 % (05/20 0606) Weight change:  Last BM Date : 08/24/21  PE: GEN:  NAD, obese ABD:  Epigastric tenderness, voluntary guarding, no peritonitis  Lab Results: CBC    Component Value Date/Time   WBC 4.5 08/25/2021 0406   RBC 3.99 08/25/2021 0406   HGB 10.3 (L) 08/25/2021 0406   HCT 34.8 (L) 08/25/2021 0406   PLT 355 08/25/2021 0406   MCV 87.2 08/25/2021 0406   MCH 25.8 (L) 08/25/2021 0406   MCHC 29.6 (L) 08/25/2021 0406   RDW 15.0 08/25/2021 0406   LYMPHSABS 2.4 08/09/2021 0607   MONOABS 0.5 08/09/2021 0607   EOSABS 0.2 08/09/2021 0607   BASOSABS 0.1 08/09/2021 0607  CMP     Component Value Date/Time   NA 141 08/25/2021 0406   K 3.5 08/25/2021 0406   CL 105 08/25/2021 0406   CO2 25 08/25/2021 0406   GLUCOSE 109 (H) 08/25/2021 0406   BUN 12 08/25/2021 0406   CREATININE 0.72 08/25/2021 0406   CALCIUM 9.3 08/25/2021 0406   PROT 8.1 08/24/2021 0946   ALBUMIN 4.0 08/24/2021 0946   AST 32 08/24/2021 0946   ALT 35 08/24/2021 0946   ALKPHOS 36 (L) 08/24/2021 0946   BILITOT 0.8 08/24/2021 0946   GFRNONAA >60 08/25/2021 0406   GFRAA >60 10/18/2016 1454   Assessment:   Ongoing lower chest and EG/RUQ abdominal pain.  Unrevealing endoscopy (gastritis seen was mild and would not account for patient's symptoms), CT scan, labs.  Cardiac work-up unrevealing.  Sludge seen on U/S and patient's symptoms not inconsistent with gallbladder process.  Plan:   I feel patient's symptoms are most likely gallbladder related.  Sludge seen on GB.  Unrevealing cardiac w/u and unrevealing EGD and unrevealing CT scans. Will ask surgery to see patient for consideration of possible cholecystectomy.  No further  GI work-up is planned. No improvement with PPI; will add sucralfate. [facility] will follow at a distance.   Lauren Schmidt 08/25/2021, 8:58 AM   Cell [phone] If no answer or after 5 PM call [phone]

## 2021-08-25 NOTE — Consult Note (Addendum)
Lauren Schmidt  Jul 20, 1960 [id number]  CARE TEAM:  PCP: No primary care provider on file.  Outpatient Care Team: Patient Care Team: Arta Silence, MD as Consulting Physician (Gastroenterology)  Inpatient Treatment Team: Treatment Team: Attending Provider: Elmarie Shiley, MD; Consulting Physician: Arta Silence, MD; Rounding Team: Threasa Beards, MD; Utilization Review: Alease Medina, RN; Technician: Tonie Griffith, NT; Physical Therapist: Alphonzo Severance, PT; Registered Nurse: Tana Conch, RN; Charge Nurse: Con Memos, RN; Social Worker: Cecil Cobbs; Consulting Physician: Edison Pace, Md, MD   This patient is a 61 y.o.female who presents today for surgical evaluation at the text page request of Dr Paulita Fujita.   Chief complaint / Reason for evaluation: Abdominal/chest pain.  Question gallbladder etiology  Morbidly obese female with new or medical problems including hypertension, hyperlipidemia, type 2 diabetes.  History of GERD.  Persistent chest pain for several weeks.  Seem to be when she was walking to her car after a doctor visit.  Worsening.  Went to emergency room a few weeks ago.  GERD suspected.  Sent home on Protonix and Carafate.  Outpatient follow-up with primary care and gastroenterology recommended.  She describes the pain at that time as burning in the epigastric low sternal region.  Some dysphagia to solids and liquids.  Small hiatal hernia noted on a esophagram apparently seen primary care and [facility] gastroenterology.  Apparently was taking some NSAIDs.  Had been given GI cocktails in the past without much relief.  She is tried many over-the-counter pain medications with without much relief.  elevated troponins.  Admitted and cardiology was consulted.    Patient had a coronary CT angiogram.  Some moderate disease noted, esp RCA.  Echocardiogram and EKG relatively underwhelming.  The did not suspect an acute coronary event and signed off yesterday.  Pt  had an ultrasound which showed no cholelithiasis and mild, bowel duct dilatation and steatosis.  Question of some sludge.  Had a nuclear medicine study that showed normal gallbladder filling and emptying.  Underwent endoscopy that seemed underwhelming.  Gastrology sent me a text this morning wondering if general surgery could see the patient for consultation.  We had seen her in the past.  She had incarcerated hernia requiring small bowel resection prior repair in 2014.  Then recurrence Dr. Marlou Starks did lyse lesions and mesh repair 2018.  Patient notes she has had this pain almost for a month.  It is midline subxiphoid region.  It is constant.  There is not a specific food trigger.  She sometimes does feel dysphagia to solids and has to drink liquids with that.  Some fullness and early satiety and bloating.  She has some soreness in right and left flanks but they are symmetrical.  She moves her bowels usually every day.  She has been walking the hallways without much difficulty and denies any exertional pain at this point.  Assessment  Lauren Schmidt  61 y.o. female  2 Days Post-Op  Procedure(s): ESOPHAGOGASTRODUODENOSCOPY (EGD) BIOPSY  Problem List:  Principal Problem:   Chest pain Active Problems:   Morbid obesity with BMI of 50.0-59.9, adult (HCC)   HLD (hyperlipidemia)   HTN (hypertension)   DM2 (diabetes mellitus, type 2) (HCC)   Abdominal pain, chronic, epigastric   Depressive disorder   Diabetic peripheral neuropathy associated with type 2 diabetes mellitus (HCC)   Dysphagia   Gastroesophageal reflux disease   Obstructive sleep apnea (adult) (pediatric)   Sleep disturbance   Type 2  diabetes mellitus with mild nonproliferative diabetic retinopathy without macular edema, bilateral (HCC)   Gastritis   Epigastric pain of uncertain etiology but not very consistent with biliary colic.  Plan:  While she clearly has some abdominal discomfort, she does not really have a good story for  postprandial pain.  Her pain is diffuse in the upper abdomen and symmetrical worse in the midline.  While it could be referred pain to the epigastric region, she really does not have any worsening discomfort on right or left side which argues against gallbladder.  There is no evidence of cholecystitis by HIDA scan or ultrasound.  She is questionable sludge but that in itself is not an indication for cholecystectomy in my mind without having a good story for biliary colic or objective evidence of cholecystitis.   With her dysphagia you worry about a hiatal hernia or achalasia.  Endoscopy does not see a giant hiatal hernia.  There are some evidence of gastritis but it does not look severe.  Follow-up on biopsies will be helpful.  Consider manometry to see if this is an esophageal dysmotility issue.  Achalasia with her persistent/worsening dysphagia without a tumor or incarcerated hernia or esophageal web?  Another possibility would be delayed gastric emptying.  Consider gastric emptying study.  The fact that there is no strong evidence of any retained food, need for narcotics, uncontrolled diabetes argues against that as well but in the differential.  Could consider MRCP/MRI if gastroenterology feels like it would be of much utility.  While technically feasible to do a laparoscopic cholecystectomy (multiple ports given her periumbilical mesh), I am skeptical that her gallbladder is an issue given the absence of cholecystitis, questionable sludge only, and no cystic duct obstruction by nuclear medicine study.  Could consider a repeat nuclear medicine HIDA scan with gallbladder ejection fraction to see if she has more of an biliary dyskinesia picture.  If gallbladder fills and empties normal with no gallbladder ejection fraction, that argues against a gallbladder etiology and I will return back to an esophagogastric issue.  Would want medical and cardiac clearance before considering surgery.  Suspect she would  be cleared since echocardiogram looks okay and there is no evidence of myocardial infarction.  Seems like her exercise tolerance is a little better as well.  -VTE prophylaxis- SCDs, etc -mobilize as tolerated to help recovery  I reviewed Consultant cardiology and gastroenterology notes, hospitalist notes, last 24 h vitals and pain scores, last 48 h intake and output, last 24 h labs and trends, and last 24 h imaging results. I have reviewed this patient's available data, including medical history, events of note, test results, etc as part of my evaluation.  A significant portion of that time was spent in counseling.  Care during the described time interval was provided by me.  This care required high  level of medical decision making.  08/25/2021  Ardeth Sportsman, MD, FACS, MASCRS Esophageal, Gastrointestinal & Colorectal Surgery Robotic and Minimally Invasive Surgery  [facility], [facility]  [facility]  [address] [place], [place] [address] [phone] Fax [phone] Main  CONTACT INFORMATION:  Weekday (9AM-5PM): Call [facility] main office at [phone]  Weeknight (5PM-9AM) or Weekend/Holiday: Check [email or web address] (password " [email or web address]") for General Surgery [facility] coverage  (Please, do not use SecureChat as it is not reliable communication to operating surgeons for immediate patient care)      08/25/2021      Past Medical History:  Diagnosis Date   Anemia    Anxiety    Bowel obstruction ([place]) 05/14/2016   Depression    Diabetes mellitus without complication ([place])    type 2   Family history of adverse reaction to anesthesia    sister's bp dropped low during surgery   Fatty liver    GERD (gastroesophageal reflux disease)    History of hiatal hernia    Hypercholesterolemia    Hypertension    Peripheral neuropathy    feet   Sleep apnea    uses cpap   Trigger thumb of both thumbs    Umbilical hernia with gangrene and  obstruction 01/31/2013   Repaired on 01/31/13. SB resection done     Past Surgical History:  Procedure Laterality Date   BOWEL RESECTION N/A 01/31/2013   Procedure: SMALL BOWEL RESECTION;  Surgeon: Merrie Roof, MD;  Location: [place];  Service: General;  Laterality: N/A;   COLON SURGERY     COLONOSCOPY     HERNIA REPAIR     INSERTION OF MESH N/A 07/01/2016   Procedure: INSERTION OF MESH;  Surgeon: Autumn Messing III, MD;  Location: [place];  Service: General;  Laterality: N/A;   KNEE ARTHROSCOPY Right 1990s?   LAPAROSCOPIC ASSISTED VENTRAL HERNIA REPAIR  07/01/2016   Right Knee arthroscopy     TRIGGER FINGER RELEASE Right 10/24/2016   Procedure: RELEASE TRIGGER FINGER/A-1 PULLEY RIGHT THUMB TRIGGER RELEASE;  Surgeon: Leanora Cover, MD;  Location: [place];  Service: Orthopedics;  Laterality: Right;   UMBILICAL HERNIA REPAIR N/A 01/31/2013   Procedure: HERNIA REPAIR UMBILICAL ADULT;  Surgeon: Merrie Roof, MD;  Location: [place];  Service: General;  Laterality: N/A;   VENTRAL HERNIA REPAIR N/A 07/01/2016   Procedure: LAPROSCOPIC ASSISTED VENTRAL HERNIA REPAIR;  Surgeon: Autumn Messing III, MD;  Location: [place];  Service: General;  Laterality: N/A;    Social History   Socioeconomic History   Marital status: Single    Spouse name: Not on file   Number of children: Not on file   Years of education: Not on file   Highest education level: Not on file  Occupational History   Not on file  Tobacco Use   Smoking status: Former   Smokeless tobacco: Never   Tobacco comments:    Quit smoking 30 years ago  Vaping Use   Vaping Use: Never used  Substance and Sexual Activity   Alcohol use: No   Drug use: No   Sexual activity: Yes  Other Topics Concern   Not on file  Social History Narrative   Not on file   Social Determinants of Health   Financial Resource Strain: Not on file  Food Insecurity: Not on file  Transportation Needs: Not on file  Physical Activity: Not on file   Stress: Not on file  Social Connections: Not on file  Intimate Partner Violence: Not on file    Family History  Problem Relation Age of Onset   Heart failure Mother    Aneurysm Mother    Diabetes Mother     Current Facility-Administered Medications  Medication Dose Route Frequency Provider Last Rate Last Admin   acetaminophen (TYLENOL) tablet 650 mg  650 mg Oral Q4H PRN Arta Silence, MD   650 mg at 08/24/21 1816   ALPRAZolam Duanne Moron) tablet 0.5 mg  0.5 mg Oral QHS PRN Arta Silence, MD   0.5 mg at 08/21/21 2228   aspirin EC tablet 81 mg  81  mg Oral Daily Freada Bergeron, MD   81 mg at 08/25/21 [id number]   dapagliflozin propanediol (FARXIGA) tablet 5 mg  5 mg Oral Michell Heinrich, MD   5 mg at 08/24/21 2123   enoxaparin (LOVENOX) injection 40 mg  40 mg Subcutaneous Q24H Arta Silence, MD   40 mg at 08/25/21 0839   feeding supplement (ENSURE ENLIVE / ENSURE PLUS) liquid 237 mL  237 mL Oral BID BM Arta Silence, MD       fenofibrate tablet 160 mg  160 mg Oral Daily Arta Silence, MD   160 mg at 08/25/21 [id number]   insulin aspart (novoLOG) injection 0-15 Units  0-15 Units Subcutaneous TID WC Arta Silence, MD   2 Units at 08/25/21 1259   insulin aspart (novoLOG) injection 0-5 Units  0-5 Units Subcutaneous QHS Arta Silence, MD       lactated ringers infusion   Intravenous Continuous Arta Silence, MD 10 mL/hr at 08/23/21 1152 Restarted at 08/23/21 1250   lisinopril (ZESTRIL) tablet 20 mg  20 mg Oral Daily Arta Silence, MD   20 mg at 08/25/21 [id number]   metoprolol succinate (TOPROL-XL) 24 hr tablet 50 mg  50 mg Oral Daily Arta Silence, MD   50 mg at 08/25/21 [id number]   morphine (PF) 2 MG/ML injection 2-4 mg  2-4 mg Intravenous Q3H PRN Arta Silence, MD   2 mg at 08/25/21 0839   ondansetron (ZOFRAN) injection 4 mg  4 mg Intravenous Q6H PRN Arta Silence, MD   4 mg at 08/22/21 0912   pantoprazole (PROTONIX) EC tablet 40 mg  40 mg Oral BID Arta Silence, MD   40 mg at  08/25/21 [id number]   prochlorperazine (COMPAZINE) injection 10 mg  10 mg Intravenous Q6H PRN Arta Silence, MD       rosuvastatin (CRESTOR) tablet 20 mg  20 mg Oral Daily Freada Bergeron, MD   20 mg at 08/25/21 0915   sertraline (ZOLOFT) tablet 200 mg  200 mg Oral Daily Arta Silence, MD   200 mg at 08/25/21 [id number]   sucralfate (CARAFATE) 1 GM/10ML suspension 1 g  1 g Oral TID WC & HS Arta Silence, MD   1 g at 08/25/21 1213   traMADol (ULTRAM) tablet 50 mg  50 mg Oral Q6H PRN Regalado, Belkys A, MD   50 mg at 08/25/21 1259     Allergies  Allergen Reactions   Nitrofurantoin Macrocrystal Shortness Of Breath, Itching, Swelling and Anaphylaxis   Tape Other (See Comments)    Leave red marks and welts   Shellfish Allergy Rash    Rash     ROS:   All other systems reviewed & are negative except per HPI or as noted below: Constitutional:  No fevers, chills, sweats.  Weight stable Eyes:  No vision changes, No discharge HENT:  No sore throats, nasal drainage Lymph: No neck swelling, No bruising easily Pulmonary:  No cough, productive sputum CV: No orthopnea, PND  Patient walks  10-15 minutes without difficulty.  No exertional chest/neck/shoulder/arm pain.  GI:  No personal nor family history of GI/colon cancer, inflammatory bowel disease, irritable bowel syndrome, allergy such as Celiac Sprue, dietary/dairy problems, colitis, ulcers nor gastritis.  No recent sick contacts/gastroenteritis.  No travel outside the country.  No changes in diet.  Renal: No UTIs, No hematuria Genital:  No drainage, bleeding, masses Musculoskeletal: No severe joint pain.  Good ROM major joints Skin:  No sores or lesions Heme/Lymph:  No easy bleeding.  No  swollen lymph nodes   BP (!) 149/69 (BP Location: Left Arm)   Pulse (!) 58   Temp 98 F (36.7 C) (Oral)   Resp 19   Ht 5' (1.524 m)   Wt 117.9 kg   SpO2 94%   BMI 50.78 kg/m   Physical Exam:  Constitutional: Not cachectic.  Hygeine adequate.  Vitals  signs as above.   Eyes: Pupils reactive, normal extraocular movements. Sclera nonicteric Neuro: CN II-XII intact.  No major focal sensory defects.  No major motor deficits. Lymph: No head/neck/groin lymphadenopathy Psych:  No severe agitation.  No severe anxiety.  Judgment & insight Adequate, Oriented x4, HENT: Normocephalic, Mucus membranes moist.  No thrush.   Neck: Supple, No tracheal deviation.  No obvious thyromegaly Chest: No pain to chest wall compression.  Good respiratory excursion.  No audible wheezing CV:  Pulses intact.  regular rhythm.  No major extremity edema  Abdomen:  Obese with panniculus Hernia: Not present. Diastasis recti: Not present. Soft.   Nondistended.  No hepatomegaly.  No splenomegaly.    She has discomfort in left upper quadrant and right upper quadrant/flank equally, the midline subxiphoid epigastric region the most uncomfortable but there is no guarding or rebound tenderness.Marland Kitchen  No Murphy sign.  Periumbilical incision without any hernia.  Large panniculus with minimal discomfort underneath.  Gen:  Inguinal hernia: Not present.  Inguinal lymph nodes: without lymphadenopathy.   Rectal: (Deferred) Ext: No obvious deformity or contracture.  Edema: Not present.  No cyanosis Skin: No major subcutaneous nodules.  Warm and dry Musculoskeletal: Severe joint rigidity not present.  No obvious clubbing.  No digital petechiae.     Results:   Labs: Results for orders placed or performed during the hospital encounter of 08/21/21 (from the past 48 hour(s))  Surgical pathology     Status: None   Collection Time: 08/23/21  1:05 PM  Result Value Ref Range   SURGICAL PATHOLOGY      SURGICAL PATHOLOGY CASE: [id number] PATIENT: [facility] Surgical Pathology Report     Clinical History: Abdominal pain, frequent NSAID use ([place])     FINAL MICROSCOPIC DIAGNOSIS:  A. STOMACH, BIOPSY: - Gastric oxyntic mucosa with parietal cell hyperplasia as can be seen in  hypergastrinemic states such as PPI therapy. - Gastric antral mucosa with no specific histopathologic changes - Helicobacter pylori-like organisms are not identified on routine HE stain      Amiere Cawley DESCRIPTION:  Received in formalin are tan, soft tissue fragments that are submitted in toto. Number: 4 size: 0.3-0.6 cm blocks: 1 (GRP 08/23/2021)   Final Diagnosis performed by Jaquita Folds, MD.   Electronically signed 08/24/2021 Technical and / or Professional components performed at [facility], [place] [address], [place], [place] [address].  Immunohistochemistry Technical component (if applicable) was performed at [facility]. [address] [place], [place], [place], [place] [address].   IMMUNOHISTOCHEMISTRY DISCLAIMER (if applicable): Some of these immunohistochemical stains may have been developed and the performance characteristics determine by [facility]. Some may not have been cleared or approved by the U.S. Food and Drug Administration. The FDA has determined that such clearance or approval is not necessary. This test is used for clinical purposes. It should not be regarded as investigational or for research. This laboratory is certified under the [place] (CLIA-88) as qualified to perform high complexity clinical laboratory testing.  The controls stained appropriately.   Glucose, capillary     Status: Abnormal  Collection Time: 08/23/21  4:13 PM  Result Value Ref Range   Glucose-Capillary 145 (H) 70 - 99 mg/dL    Comment: Glucose reference range applies only to samples taken after fasting for at least 8 hours.  Glucose, capillary     Status: Abnormal   Collection Time: 08/23/21  5:58 PM  Result Value Ref Range   Glucose-Capillary 121 (H) 70 - 99 mg/dL    Comment: Glucose reference range applies only to samples taken after fasting for at least 8 hours.  Glucose, capillary     Status:  Abnormal   Collection Time: 08/23/21 10:03 PM  Result Value Ref Range   Glucose-Capillary 115 (H) 70 - 99 mg/dL    Comment: Glucose reference range applies only to samples taken after fasting for at least 8 hours.  Glucose, capillary     Status: Abnormal   Collection Time: 08/24/21  7:12 AM  Result Value Ref Range   Glucose-Capillary 140 (H) 70 - 99 mg/dL    Comment: Glucose reference range applies only to samples taken after fasting for at least 8 hours.  Hepatic function panel     Status: Abnormal   Collection Time: 08/24/21  9:46 AM  Result Value Ref Range   Total Protein 8.1 6.5 - 8.1 g/dL   Albumin 4.0 3.5 - 5.0 g/dL   AST 32 15 - 41 U/L   ALT 35 0 - 44 U/L   Alkaline Phosphatase 36 (L) 38 - 126 U/L   Total Bilirubin 0.8 0.3 - 1.2 mg/dL   Bilirubin, Direct <0.1 0.0 - 0.2 mg/dL   Indirect Bilirubin NOT CALCULATED 0.3 - 0.9 mg/dL    Comment: Performed at [facility], [place] [address] [place], [place] [address]  Glucose, capillary     Status: Abnormal   Collection Time: 08/24/21 11:33 AM  Result Value Ref Range   Glucose-Capillary 102 (H) 70 - 99 mg/dL    Comment: Glucose reference range applies only to samples taken after fasting for at least 8 hours.  Glucose, capillary     Status: None   Collection Time: 08/24/21  4:22 PM  Result Value Ref Range   Glucose-Capillary 96 70 - 99 mg/dL    Comment: Glucose reference range applies only to samples taken after fasting for at least 8 hours.  Glucose, capillary     Status: Abnormal   Collection Time: 08/24/21  8:40 PM  Result Value Ref Range   Glucose-Capillary 190 (H) 70 - 99 mg/dL    Comment: Glucose reference range applies only to samples taken after fasting for at least 8 hours.  CBC     Status: Abnormal   Collection Time: 08/25/21  4:06 AM  Result Value Ref Range   WBC 4.5 4.0 - 10.5 K/uL   RBC 3.99 3.87 - 5.11 MIL/uL   Hemoglobin 10.3 (L) 12.0 - 15.0 g/dL    Comment: REPEATED TO VERIFY   HCT 34.8 (L)  36.0 - 46.0 %   MCV 87.2 80.0 - 100.0 fL   MCH 25.8 (L) 26.0 - 34.0 pg   MCHC 29.6 (L) 30.0 - 36.0 g/dL   RDW 15.0 11.5 - 15.5 %   Platelets 355 150 - 400 K/uL   nRBC 0.0 0.0 - 0.2 %    Comment: Performed at [facility], [place] [address], [place],  [id number]  Basic metabolic panel     Status: Abnormal   Collection Time: 08/25/21  4:06 AM  Result Value Ref  Range   Sodium 141 135 - 145 mmol/L   Potassium 3.5 3.5 - 5.1 mmol/L   Chloride 105 98 - 111 mmol/L   CO2 25 22 - 32 mmol/L   Glucose, Bld 109 (H) 70 - 99 mg/dL    Comment: Glucose reference range applies only to samples taken after fasting for at least 8 hours.   BUN 12 8 - 23 mg/dL   Creatinine, Ser 0.72 0.44 - 1.00 mg/dL   Calcium 9.3 8.9 - 10.3 mg/dL   GFR, Estimated >60 >60 mL/min    Comment: (NOTE) Calculated using the CKD-EPI Creatinine Equation (2021)    Anion gap 11 5 - 15    Comment: Performed at [facility], [place] [address] [place], [place] [address]  Glucose, capillary     Status: Abnormal   Collection Time: 08/25/21  7:51 AM  Result Value Ref Range   Glucose-Capillary 114 (H) 70 - 99 mg/dL    Comment: Glucose reference range applies only to samples taken after fasting for at least 8 hours.  Glucose, capillary     Status: Abnormal   Collection Time: 08/25/21 11:45 AM  Result Value Ref Range   Glucose-Capillary 146 (H) 70 - 99 mg/dL    Comment: Glucose reference range applies only to samples taken after fasting for at least 8 hours.    Imaging / Studies: DG Chest 2 View  Result Date: 08/21/2021 CLINICAL DATA:  61 year old female with history of chest pain and upper abdominal pain intermittently for the past 3 weeks. EXAM: CHEST - 2 VIEW COMPARISON:  Chest x-ray 08/27/2011. FINDINGS: Lung volumes are low. No consolidative airspace disease. No pleural effusions. No pneumothorax. No pulmonary nodule or mass noted. Pulmonary vasculature and the cardiomediastinal silhouette  are within normal limits. IMPRESSION: 1. Low lung volumes without radiographic evidence of acute cardiopulmonary disease. Electronically Signed   By: Vinnie Langton M.D.   On: 08/21/2021 05:52   NM Hepatobiliary Liver Func  Result Date: 08/24/2021 CLINICAL DATA:  Abdominal pain for 3 weeks. Borderline biliary ductal dilatation on recent ultrasound. EXAM: NUCLEAR MEDICINE HEPATOBILIARY IMAGING TECHNIQUE: Sequential images of the abdomen were obtained out to 60 minutes following intravenous administration of radiopharmaceutical. Delayed image was also obtained at 150 minutes. RADIOPHARMACEUTICALS:  5.5 mCi Tc-77m  Choletec IV COMPARISON:  None Available. FINDINGS: Prompt uptake and biliary excretion of activity by the liver is seen. Gallbladder activity is visualized, consistent with patency of cystic duct. Biliary activity passes into small bowel, consistent with patent common bile duct. IMPRESSION: Normal hepatobiliary scan, demonstrating patency of both the cystic and common bile ducts. Electronically Signed   By: Marlaine Hind M.D.   On: 08/24/2021 14:57   CT ABDOMEN PELVIS W CONTRAST  Result Date: 08/21/2021 CLINICAL DATA:  61 year old female with history of chest and upper abdominal pain for the past 3 weeks, worsening this morning. EXAM: CT ABDOMEN AND PELVIS WITH CONTRAST TECHNIQUE: Multidetector CT imaging of the abdomen and pelvis was performed using the standard protocol following bolus administration of intravenous contrast. RADIATION DOSE REDUCTION: This exam was performed according to the departmental dose-optimization program which includes automated exposure control, adjustment of the mA and/or kV according to patient size and/or use of iterative reconstruction technique. CONTRAST:  166mL OMNIPAQUE IOHEXOL 300 MG/ML  SOLN COMPARISON:  CT of the abdomen and pelvis 08/09/2021. FINDINGS: Lower chest: Unremarkable. Hepatobiliary: Diffuse low attenuation throughout the hepatic parenchyma,  indicative of a background of severe hepatic steatosis. No suspicious cystic or solid  hepatic lesions. No intra or extrahepatic biliary ductal dilatation. Gallbladder is normal in appearance. Pancreas: No pancreatic mass. No pancreatic ductal dilatation. No pancreatic or peripancreatic fluid collections or inflammatory changes. Spleen: Unremarkable. Adrenals/Urinary Tract: In the lower pole of the right kidney there are 2 fat containing lesions, indicative of angiomyolipomas. The largest of these measures 2.3 x 1.4 cm (axial image 48 of series 2). Left kidney and bilateral adrenal glands are normal in appearance. No hydroureteronephrosis. Small amount of gas non dependently in the lumen of the urinary bladder. Urinary bladder is otherwise normal in appearance. Stomach/Bowel: The appearance of the stomach is normal. There is no pathologic dilatation of small bowel or colon. The appendix is not confidently identified and may be surgically absent. Regardless, there are no inflammatory changes noted adjacent to the cecum to suggest the presence of an acute appendicitis at this time. Vascular/Lymphatic: Minimal aortic atherosclerosis, without evidence of aneurysm or dissection in the abdominal or pelvic vasculature. No lymphadenopathy noted in the abdomen or pelvis. Reproductive: Small coarse calcifications in the fundus of the uterus, likely tiny calcified fibroids. Ovaries are unremarkable in appearance. Other: No significant volume of ascites.  No pneumoperitoneum. Musculoskeletal: There are no aggressive appearing lytic or blastic lesions noted in the visualized portions of the skeleton. IMPRESSION: 1. Gas is present in the lumen of the urinary bladder. This could be iatrogenic in the setting of recent catheterization for urinalysis. If there has been no recent catheterization, however, correlation with urinalysis would be recommended, as the possibility of urinary tract infection with gas-forming organisms is not  excluded. 2. No other potential acute findings noted elsewhere in the abdomen or pelvis to account for the patient's symptoms. 3. Severe hepatic steatosis. 4. 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 5. Mild aortic atherosclerosis. Electronically Signed   By: Vinnie Langton M.D.   On: 08/21/2021 06:45   CT ABDOMEN PELVIS W CONTRAST  Result Date: 08/09/2021 CLINICAL DATA:  61 year old female with history of generalized abdominal pain and no recent bowel movement. Suspected bowel obstruction. EXAM: CT ABDOMEN AND PELVIS WITH CONTRAST TECHNIQUE: Multidetector CT imaging of the abdomen and pelvis was performed using the standard protocol following bolus administration of intravenous contrast. RADIATION DOSE REDUCTION: This exam was performed according to the departmental dose-optimization program which includes automated exposure control, adjustment of the mA and/or kV according to patient size and/or use of iterative reconstruction technique. CONTRAST:  147mL OMNIPAQUE IOHEXOL 300 MG/ML  SOLN COMPARISON:  CT of the abdomen and pelvis 08/23/2016. FINDINGS: Lower chest: Unremarkable. Hepatobiliary: Diffuse low attenuation throughout the hepatic parenchyma, indicative of a background of severe hepatic steatosis. No discrete cystic or solid hepatic lesions. No intra or extrahepatic biliary ductal dilatation. Gallbladder is normal in appearance. Pancreas: No pancreatic mass. No pancreatic ductal dilatation. No pancreatic or peripancreatic fluid collections or inflammatory changes. Spleen: Unremarkable. Adrenals/Urinary Tract: In the lower pole of the right kidney there are 2 small lesions both of which contain fatty attenuation, indicative angiomyolipomas, largest of which measures 1.7 cm in diameter (coronal image 77 of series 5). Left kidney and bilateral adrenal glands are normal in appearance. No hydroureteronephrosis. Urinary bladder is normal in  appearance. Stomach/Bowel: The appearance of the stomach is normal. There is no pathologic dilatation of small bowel or colon. Normal appendix. Vascular/Lymphatic: Aortic atherosclerosis, without evidence of aneurysm or dissection in the abdominal or pelvic vasculature. No lymphadenopathy noted in the abdomen or pelvis.  Reproductive: Uterus and ovaries are unremarkable in appearance. Other: No significant volume of ascites noted in the visualized portions of the peritoneal cavity. Musculoskeletal: There are no aggressive appearing lytic or blastic lesions noted in the visualized portions of the skeleton. IMPRESSION: 1. No acute findings are noted in the abdomen or pelvis to account for the patient's symptoms. 2. Hepatic steatosis. 3. Two fat containing lesions in the lower pole of the right kidney, compatible with angiomyolipomas, largest of which measures 1.7 cm in diameter. Electronically Signed   By: Vinnie Langton M.D.   On: 08/09/2021 07:28   CT CORONARY MORPH W/CTA COR W/SCORE W/CA W/CM &/OR WO/CM  Addendum Date: 08/22/2021   ADDENDUM REPORT: 08/22/2021 14:32 HISTORY: Chest pain/anginal equiv, intermediate CAD risk, not treadmill candidate EXAM: Cardiac/Coronary  CT TECHNIQUE: The patient was scanned on a [employer]. PROTOCOL: A 120 kV prospective scan was triggered in the descending thoracic aorta at 111 HU's. Axial non-contrast 3 mm slices were carried out through the heart. The data set was analyzed on a dedicated work station and scored using the Agatston method. Gantry rotation speed was 250 msecs and collimation was .6 mm. Beta blockade and 0.8 mg of sl NTG was given. The 3D data set was reconstructed in 5% intervals of the 35-75 % of the R-R cycle. Systolic and diastolic phases were analyzed on a dedicated work station using MPR, MIP and VRT modes. The patient received 132mL OMNIPAQUE IOHEXOL 350 MG/ML SOLN contrast. FINDINGS: Image quality: Poor Noise artifact is: Moderate, signal to  noise ratio is reduced. Coronary calcium score is 107, which places the patient in the 87th percentile for age and sex matched control. Coronary arteries: Normal coronary origins.  Right dominance. Right Coronary Artery: Mild mixed atherosclerotic plaque in the proximal RCA, 25-49% stenosis. Moderate atherosclerotic plaque in the mid and distal RCA, 50-69% stenosis. Left Main Coronary Artery: No detectable plaque or stenosis. Left Anterior Descending Coronary Artery: Minimal scattered mixed atherosclerotic plaque in the LAD, <25% stenosis. Moderate atherosclerotic plaque in the distal LAD, 50-69% stenosis. Image quality is suboptimal to definitely assess lumen, probable moderate stenosis in the proximal first diagonal artery, 50-69% stenosis. Left Circumflex Artery: Small first OM with calcified plaque, may have stenosis but unable to determine definitively. No other detectable plaque or stenosis in LCx. Aorta: Normal size, 30 mm at the mid ascending aorta (level of the PA bifurcation) measured double oblique. No calcifications. No dissection. Aortic Valve: No calcifications. Other findings: Normal pulmonary vein drainage into the left atrium. Normal left atrial appendage without thrombus. Normal size of the pulmonary artery. IMPRESSION: 1. 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 CT FFR will be performed and reported separately. 2. Coronary calcium score is 107, which places the patient in the [place] percentile for age and sex matched control. 3. Normal coronary origins with right dominance. Electronically Signed   By: Cherlynn Kaiser M.D.   On: 08/22/2021 14:32   Result Date: 08/22/2021 EXAM: OVER-READ INTERPRETATION  CT CHEST The following report is a limited chest CT over-read performed by radiologist Dr. Abigail Miyamoto of [facility], [place] on 08/22/2021. This over-read does not include interpretation of cardiac or coronary anatomy or pathology. The  coronary CTA interpretation by the cardiologist is attached. COMPARISON:  CTA chest of 1 day prior FINDINGS: Vascular: Aortic atherosclerosis. No central pulmonary embolism, on this non-dedicated study. Mediastinum/Nodes: No imaged thoracic adenopathy. Lungs/Pleura: No pleural fluid. Tiny pulmonary  nodules were detailed on yesterday's dedicated diagnostic chest CT. No superimposed acute lobar consolidation. Upper Abdomen: Hepatic steatosis. Normal imaged portions of the spleen, stomach. Musculoskeletal: No acute osseous abnormality. Lower thoracic spondylosis. IMPRESSION: No acute findings in the imaged extracardiac chest. Aortic Atherosclerosis (ICD10-I70.0). Hepatic steatosis. Electronically Signed: By: Abigail Miyamoto M.D. On: 08/22/2021 11:53   DG UGI W DOUBLE CM (HD BA)  Result Date: 08/17/2021 CLINICAL DATA:  A 61 year old female presents with chest discomfort while swallowing. EXAM: UPPER GI SERIES WITH KUB TECHNIQUE: After obtaining a scout radiograph a routine upper GI series was performed using thin and high density barium. FLUOROSCOPY TIME:  Fluoroscopy Time:  2 minutes 48 seconds Radiation Exposure Index (if provided by the fluoroscopic device): 72 mGy Number of Acquired Spot Images: 1 COMPARISON:  CT of the abdomen and pelvis from Aug 09, 2021. FINDINGS: Scout film of the abdomen without signs of obstruction. Thin barium was administered for assessment of swallow function, Adonias Demore swallow function without signs of Ivee Poellnitz abnormality related to swallowing. Effervescent crystals and thick barium was then administered showing normal distensibility of the esophagus. No visible lesion or signs of esophageal stricture. Study mildly compromised by patient body habitus. Contrast flowed into the stomach. After coating the stomach images were obtained which shows no Gurpreet Mikhail fold thickening or gastric lesion. Contrast flowed readily into the duodenum with normal appearance of duodenal bulb and normal duodenal jejunal  anatomy. Assessment of esophageal motility shows normal primary wave with complete stripping of the esophagus due to primary peristalsis. Full column assessment with normal caliber of the esophagus, small hiatal hernia. Barium tablet administered and passed without difficulty into the stomach through the esophagus. No gastroesophageal reflux was elicited with positioning or water siphon. IMPRESSION: Small hiatal hernia otherwise normal upper GI evaluation. Patient did experience discomfort with swallowing and related a globus sensation with the pill which passed readily from the esophagus into the stomach. Electronically Signed   By: Zetta Bills M.D.   On: 08/17/2021 11:17   CT CORONARY FRACTIONAL FLOW RESERVE DATA PREP  Result Date: 08/22/2021 EXAM: CT FFR ANALYSIS CLINICAL DATA:  abnormal coronary CT FINDINGS: FFRct analysis was performed on the original cardiac CT angiogram dataset. Diagrammatic representation of the FFRct analysis is provided in a separate PDF document in PACS. This dictation was created using the PDF document and an interactive 3D model of the results. 3D model is not available in the EMR/PACS. Normal FFR range is >0.80. Indeterminate (grey) zone is 0.76-0.80. FFR delta of 0.13 is considered significant. 1. Left Main: FFR = 0.98 2. LAD: Proximal FFR = 0.94, mid FFR = 0.90, Distal FFR = 0.56, first diagonal FFR = 0.81 3. LCX: Proximal FFR = 0.96, distal FFR = 0.93, OM1 FFR = 0.80 4. RCA: Proximal FFR = 0.96, mid FFR =0.93, Distal FFR = 0.81 IMPRESSION: 1. 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 The vessel is small caliber <48mm in this location. No other hemodynamically significant stenoses. RECOMMENDATIONS: Guideline-directed medical therapy and aggressive risk factor modification for secondary prevention of coronary artery disease. Electronically Signed   By: Cherlynn Kaiser M.D.   On: 08/22/2021 14:41   CT ANGIO CHEST AORTA W/CM &/OR WO/CM  Result  Date: 08/21/2021 CLINICAL DATA:  61 year old female with chest and upper abdominal pain for the past 3 weeks, worsening this morning. Suspected acute aortic syndrome. EXAM: CT ANGIOGRAPHY CHEST WITH CONTRAST TECHNIQUE: Multidetector CT imaging of the chest was performed using the standard protocol  during bolus administration of intravenous contrast. Multiplanar CT image reconstructions and MIPs were obtained to evaluate the vascular anatomy. RADIATION DOSE REDUCTION: This exam was performed according to the departmental dose-optimization program which includes automated exposure control, adjustment of the mA and/or kV according to patient size and/or use of iterative reconstruction technique. CONTRAST:  69mL OMNIPAQUE IOHEXOL 350 MG/ML SOLN COMPARISON:  No priors. FINDINGS: Cardiovascular: Precontrast images demonstrate no crescentic high attenuation associated with the wall of the thoracic aorta to suggest the presence of acute intramural hemorrhage. Mild atherosclerosis is noted in the aortic arch. No aneurysm or dissection of the thoracic aorta or the great vessels of the mediastinum. Ascending thoracic aorta, mid arch and descending thoracic aorta measure 2.7 cm, 2.4 cm and 2.5 cm in diameter respectively. No definite coronary artery calcifications. Heart size is normal. There is no significant pericardial fluid, thickening or pericardial calcification. Mediastinum/Nodes: No pathologically enlarged mediastinal or hilar lymph nodes. Esophagus is unremarkable in appearance. No axillary lymphadenopathy. Lungs/Pleura: No pneumothorax. No acute consolidative airspace disease. No pleural effusions. A few scattered tiny 2-3 mm pulmonary nodules are noted in the periphery of the lungs, nonspecific, but statistically likely benign areas of mucoid impaction within terminal bronchioles. No larger more suspicious appearing pulmonary nodules or masses are noted. Upper Abdomen: Diffuse low attenuation throughout the  visualized hepatic parenchyma, indicative of severe hepatic steatosis. Musculoskeletal: There are no aggressive appearing lytic or blastic lesions noted in the visualized portions of the skeleton. Review of the MIP images confirms the above findings. IMPRESSION: 1. No acute findings are noted in the thorax to account for the patient's symptoms. Specifically, no evidence of acute aortic syndrome. 2. Tiny 2-3 mm pulmonary nodules in the lungs bilaterally, nonspecific, but statistically likely benign. No follow-up needed if patient is low-risk (and has no known or suspected primary neoplasm). Non-contrast chest CT can be considered in 12 months if patient is high-risk. This recommendation follows the consensus statement: Guidelines for Management of Incidental Pulmonary Nodules Detected on CT Images: From the Fleischner Society 2017; Radiology 2017; 284:228-243. 3. Aortic atherosclerosis (mild). 4. Severe hepatic steatosis. Aortic Atherosclerosis (ICD10-I70.0). Electronically Signed   By: Trudie Reed M.D.   On: 08/21/2021 07:28   ECHOCARDIOGRAM COMPLETE  Result Date: 08/21/2021    ECHOCARDIOGRAM REPORT   Patient Name:   ALYSEN MOUDY Date of Exam: 08/21/2021 Medical Rec #:  [id number]    Height:       60.0 in Accession #:    [id number]   Weight:       260.0 lb Date of Birth:  [birth year]    BSA:          2.087 m Patient Age:    60 years     BP:           153/63 mmHg Patient Gender: F            HR:           70 bpm. Exam Location:  Inpatient Procedure: 2D Echo, Color Doppler, Cardiac Doppler and Intracardiac            Opacification Agent Indications:    Chest pain  History:        Patient has no prior history of Echocardiogram examinations.                 Signs/Symptoms:Chest Pain; Risk Factors:Hypertension, Diabetes                 and HLD.  Sonographer:  Joette Catching RCS Referring Phys: [id number] Jonnie Finner  Sonographer Comments: Image acquisition challenging due to patient body habitus. IMPRESSIONS  1.  Left ventricular ejection fraction, by estimation, is 60 to 65%. The left ventricle has normal function. The left ventricle has no regional wall motion abnormalities. Left ventricular diastolic parameters are indeterminate.  2. Right ventricular systolic function is normal. The right ventricular size is normal.  3. Left atrial size was mildly dilated.  4. The mitral valve is normal in structure. No evidence of mitral valve regurgitation. No evidence of mitral stenosis.  5. The aortic valve is normal in structure. Aortic valve regurgitation is not visualized. Aortic valve sclerosis is present, with no evidence of aortic valve stenosis.  6. The inferior vena cava is normal in size with greater than 50% respiratory variability, suggesting right atrial pressure of 3 mmHg. FINDINGS  Left Ventricle: Left ventricular ejection fraction, by estimation, is 60 to 65%. The left ventricle has normal function. The left ventricle has no regional wall motion abnormalities. Definity contrast agent was given IV to delineate the left ventricular  endocardial borders. The left ventricular internal cavity size was normal in size. There is no left ventricular hypertrophy. Left ventricular diastolic parameters are indeterminate. Right Ventricle: The right ventricular size is normal. No increase in right ventricular wall thickness. Right ventricular systolic function is normal. Left Atrium: Left atrial size was mildly dilated. Right Atrium: Right atrial size was normal in size. Pericardium: There is no evidence of pericardial effusion. Mitral Valve: The mitral valve is normal in structure. No evidence of mitral valve regurgitation. No evidence of mitral valve stenosis. Tricuspid Valve: The tricuspid valve is normal in structure. Tricuspid valve regurgitation is not demonstrated. No evidence of tricuspid stenosis. Aortic Valve: The aortic valve is normal in structure. Aortic valve regurgitation is not visualized. Aortic valve sclerosis is  present, with no evidence of aortic valve stenosis. Aortic valve mean gradient measures 5.0 mmHg. Aortic valve peak gradient measures 8.9 mmHg. Aortic valve area, by VTI measures 2.22 cm. Pulmonic Valve: The pulmonic valve was normal in structure. Pulmonic valve regurgitation is not visualized. No evidence of pulmonic stenosis. Aorta: The aortic root is normal in size and structure. Venous: The inferior vena cava is normal in size with greater than 50% respiratory variability, suggesting right atrial pressure of 3 mmHg. IAS/Shunts: No atrial level shunt detected by color flow Doppler.  LEFT VENTRICLE PLAX 2D LVIDd:         4.10 cm      Diastology LVIDs:         3.00 cm      LV e' medial:    6.10 cm/s LV PW:         0.80 cm      LV E/e' medial:  15.5 LV IVS:        0.90 cm      LV e' lateral:   7.62 cm/s LVOT diam:     1.90 cm      LV E/e' lateral: 12.4 LV SV:         68 LV SV Index:   33 LVOT Area:     2.84 cm  LV Volumes (MOD) LV vol d, MOD A2C: 129.0 ml LV vol d, MOD A4C: 97.0 ml LV vol s, MOD A2C: 59.3 ml LV vol s, MOD A4C: 40.6 ml LV SV MOD A2C:     69.7 ml LV SV MOD A4C:     97.0 ml LV SV MOD BP:  60.5 ml RIGHT VENTRICLE RV Basal diam:  3.40 cm RV S prime:     13.90 cm/s TAPSE (M-mode): 2.3 cm LEFT ATRIUM             Index        RIGHT ATRIUM           Index LA diam:        3.90 cm 1.87 cm/m   RA Area:     11.50 cm LA Vol (A2C):   55.6 ml 26.64 ml/m  RA Volume:   21.30 ml  10.21 ml/m LA Vol (A4C):   74.2 ml 35.56 ml/m LA Biplane Vol: 68.5 ml 32.83 ml/m  AORTIC VALVE                     PULMONIC VALVE AV Area (Vmax):    2.15 cm      PV Vmax:       0.91 m/s AV Area (Vmean):   2.16 cm      PV Peak grad:  3.3 mmHg AV Area (VTI):     2.22 cm AV Vmax:           149.00 cm/s AV Vmean:          107.000 cm/s AV VTI:            0.308 m AV Peak Grad:      8.9 mmHg AV Mean Grad:      5.0 mmHg LVOT Vmax:         113.00 cm/s LVOT Vmean:        81.600 cm/s LVOT VTI:          0.241 m LVOT/AV VTI ratio: 0.78   AORTA Ao Root diam: 3.30 cm Ao Asc diam:  3.00 cm MITRAL VALVE MV Area (PHT): 4.89 cm    SHUNTS MV Decel Time: 155 msec    Systemic VTI:  0.24 m MV E velocity: 94.70 cm/s  Systemic Diam: 1.90 cm MV A velocity: 95.50 cm/s MV E/A ratio:  0.99 MV A Prime:    10.4 cm/s Dani Gobble Croitoru MD Electronically signed by Sanda Klein MD Signature Date/Time: 08/21/2021/2:41:55 PM    Final    US Abdomen Limited RUQ (LIVER/GB)  Result Date: 08/24/2021 CLINICAL DATA:  Abdominal pain. EXAM: ULTRASOUND ABDOMEN LIMITED RIGHT UPPER QUADRANT COMPARISON:  CT imaging from Aug 21, 2021. FINDINGS: Gallbladder: Small amount of sludge in the dependent gallbladder. No gallbladder distension or wall thickening. No pericholecystic fluid. Common bile duct: Diameter: 6 mm Liver: Moderate hepatic steatosis. Fatty sparing near the gallbladder fossa. Portal vein is patent on color Doppler imaging with normal direction of blood flow towards the liver. 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 IMPRESSION: Top-normal caliber of the common bile duct. No signs of cholelithiasis or acute biliary process otherwise. Correlate with laboratory values and clinical presentation to determine whether MRI/MRCP may be warranted. Moderate hepatic steatosis. RIGHT renal angiomyolipomas. Electronically Signed   By: Zetta Bills M.D.   On: 08/24/2021 08:47    Medications / Allergies: per chart  Antibiotics: Anti-infectives (From admission, onward)    Start     Dose/Rate Route Frequency Ordered Stop   08/21/21 1900  cefTRIAXone (ROCEPHIN) 1 g in sodium chloride 0.9 % 100 mL IVPB  Status:  Discontinued        1 g 200 mL/hr over 30 Minutes Intravenous Every 24 hours 08/21/21 1847 08/24/21 1444         Note: Portions of  this report may have been transcribed using voice recognition software. Every effort was made to ensure accuracy; however, inadvertent computerized transcription errors may be present.   Any transcriptional errors that  result from this process are unintentional.    Adin Hector, MD, FACS, MASCRS Esophageal, Gastrointestinal & Colorectal Surgery Robotic and Minimally Invasive Surgery  [facility], [place]  [place], [place], [place] [address] [phone] Fax [phone] Main  CONTACT INFORMATION:  Weekday (9AM-5PM): Call [facility] main office at [phone]  Weeknight (5PM-9AM) or Weekend/Holiday: Check [email or web address] (password " [email or web address]") for General Surgery [facility] coverage  (Please, do not use SecureChat as it is not reliable communication to operating surgeons for immediate patient care)       08/25/2021  1:02 PM

## 2021-08-25 NOTE — Plan of Care (Signed)

## 2021-08-25 NOTE — Plan of Care (Signed)

## 2021-08-26 DIAGNOSIS — R079 Chest pain, unspecified: Secondary | ICD-10-CM | POA: Diagnosis not present

## 2021-08-26 LAB — GLUCOSE, CAPILLARY
Glucose-Capillary: 140 mg/dL — ABNORMAL HIGH (ref 70–99)
Glucose-Capillary: 142 mg/dL — ABNORMAL HIGH (ref 70–99)
Glucose-Capillary: 150 mg/dL — ABNORMAL HIGH (ref 70–99)
Glucose-Capillary: 186 mg/dL — ABNORMAL HIGH (ref 70–99)

## 2021-08-26 NOTE — Plan of Care (Signed)

## 2021-08-26 NOTE — Progress Notes (Signed)
Subjective: Still abd pain.  Objective: Vital signs in last 24 hours: Temp:  [97.7 F (36.5 C)-98.3 F (36.8 C)] 97.7 F (36.5 C) (05/21 0538) Pulse Rate:  [58-64] 58 (05/21 0538) Resp:  [16-18] 16 (05/21 0538) BP: (146-161)/(62-81) 161/69 (05/21 0538) SpO2:  [94 %-97 %] 94 % (05/21 0538) Weight change:  Last BM Date : 08/24/21  PE: GEN:  NAD, overweight ABD:  Epigastric tenderness  Lab Results: CBC    Component Value Date/Time   WBC 4.5 08/25/2021 0406   RBC 3.99 08/25/2021 0406   HGB 10.3 (L) 08/25/2021 0406   HCT 34.8 (L) 08/25/2021 0406   PLT 355 08/25/2021 0406   MCV 87.2 08/25/2021 0406   MCH 25.8 (L) 08/25/2021 0406   MCHC 29.6 (L) 08/25/2021 0406   RDW 15.0 08/25/2021 0406   LYMPHSABS 2.4 08/09/2021 0607   MONOABS 0.5 08/09/2021 0607   EOSABS 0.2 08/09/2021 0607   BASOSABS 0.1 08/09/2021 0607  CMP     Component Value Date/Time   NA 141 08/25/2021 0406   K 3.5 08/25/2021 0406   CL 105 08/25/2021 0406   CO2 25 08/25/2021 0406   GLUCOSE 109 (H) 08/25/2021 0406   BUN 12 08/25/2021 0406   CREATININE 0.72 08/25/2021 0406   CALCIUM 9.3 08/25/2021 0406   PROT 8.1 08/24/2021 0946   ALBUMIN 4.0 08/24/2021 0946   AST 32 08/24/2021 0946   ALT 35 08/24/2021 0946   ALKPHOS 36 (L) 08/24/2021 0946   BILITOT 0.8 08/24/2021 0946   GFRNONAA >60 08/25/2021 0406   GFRAA >60 10/18/2016 1454   Assessment:  Ongoing lower chest and EG/RUQ abdominal pain.  Unrevealing endoscopy (gastritis seen was mild and would not account for patient's symptoms), CT scan, labs.  Cardiac work-up unrevealing.  Sludge seen on U/S and patient's symptoms not inconsistent with gallbladder process.  Plan:  Patient has had negative endoscopy and esophagram; I do not think this is achalasia or motility disorder. Would do sucralfate 1 gram po qac/qhs and pantoprazole 40 mg po bid. Generalized analgesic therapy to help with her pain. Consider outpatient esophageal manometry. Do not see need  for MRI/MRCP. [facility] will follow.  No further inpatient GI testing is planned.   Freddy Jaksch 08/26/2021, 8:55 AM   Cell [phone] If no answer or after 5 PM call [phone]

## 2021-08-26 NOTE — Progress Notes (Signed)
3 Days Post-Op   Subjective/Chief Complaint: Pt about the same  Wok up ongoing    Objective: Vital signs in last 24 hours: Temp:  [97.7 F (36.5 C)-98.3 F (36.8 C)] 97.7 F (36.5 C) (05/21 0538) Pulse Rate:  [58-64] 58 (05/21 0538) Resp:  [16-18] 16 (05/21 0538) BP: (146-161)/(62-81) 161/69 (05/21 0538) SpO2:  [94 %-97 %] 94 % (05/21 0538) Last BM Date : 08/24/21  Intake/Output from previous day: 05/20 0701 - 05/21 0700 In: 280 [P.O.:280] Out: 2 [Urine:2] Intake/Output this shift: Total I/O In: 120 [P.O.:120] Out: -   General appearance: alert and cooperative GI: discomfort to palpation epigastrium   Lab Results:  Recent Labs    08/25/21 0406  WBC 4.5  HGB 10.3*  HCT 34.8*  PLT 355   BMET Recent Labs    08/25/21 0406  NA 141  K 3.5  CL 105  CO2 25  GLUCOSE 109*  BUN 12  CREATININE 0.72  CALCIUM 9.3   PT/INR No results for input(s): LABPROT, INR in the last 72 hours. ABG No results for input(s): PHART, HCO3 in the last 72 hours.  Invalid input(s): PCO2, PO2  Studies/Results: NM Hepatobiliary Liver Func  Result Date: 08/24/2021 CLINICAL DATA:  Abdominal pain for 3 weeks. Borderline biliary ductal dilatation on recent ultrasound. EXAM: NUCLEAR MEDICINE HEPATOBILIARY IMAGING TECHNIQUE: Sequential images of the abdomen were obtained out to 60 minutes following intravenous administration of radiopharmaceutical. Delayed image was also obtained at 150 minutes. RADIOPHARMACEUTICALS:  5.5 mCi Tc-20m  Choletec IV COMPARISON:  None Available. FINDINGS: Prompt uptake and biliary excretion of activity by the liver is seen. Gallbladder activity is visualized, consistent with patency of cystic duct. Biliary activity passes into small bowel, consistent with patent common bile duct. IMPRESSION: Normal hepatobiliary scan, demonstrating patency of both the cystic and common bile ducts. Electronically Signed   By: Marlaine Hind M.D.   On: 08/24/2021 14:57     Anti-infectives: Anti-infectives (From admission, onward)    Start     Dose/Rate Route Frequency Ordered Stop   08/21/21 1900  cefTRIAXone (ROCEPHIN) 1 g in sodium chloride 0.9 % 100 mL IVPB  Status:  Discontinued        1 g 200 mL/hr over 30 Minutes Intravenous Every 24 hours 08/21/21 1847 08/24/21 1444       Assessment/Plan: Abdominal pain Etiology unclear  Check for dyskinesia to better decide benefit of cholecystectomy vs work up of other etiologies / other testing etc  Dr Johney Maine to follow up Monday   Total time 30 minutes    LOS: 2 days    Marcello Moores A Carnie Bruemmer 08/26/2021

## 2021-08-26 NOTE — Progress Notes (Signed)
PROGRESS NOTE    Lauren LofflerLinda Faires  ONG:[id number] DOB: 10/16/1960 DOA: 08/21/2021 PCP: No primary care provider on file.   Brief Narrative: 61 year old with past medical history significant for hypertension, hyperlipidemia, diabetes type 2, morbid obesity presented with epigastric midsternal chest pain that is started 3 weeks prior to admission.  She initially presented to the ED 08/09/2021 and her work-up was negative.  She was discharged on Protonix and Carafate with follow-up with [facility].  Barium swallow was ordered 5/9 that was negative.  She presented with worsening symptoms.  Initial troponin was 69 and subsequently down to 3.  Cardiology was consulted and patient underwent coronary CT results pending.  GI has been consulted, and is awaiting for cardiology clearance to proceed with endoscopy.    Assessment & Plan:   Principal Problem:   Chest pain Active Problems:   Morbid obesity with BMI of 50.0-59.9, adult (HCC)   HLD (hyperlipidemia)   HTN (hypertension)   DM2 (diabetes mellitus, type 2) (HCC)   Abdominal pain, chronic, epigastric   Depressive disorder   Diabetic peripheral neuropathy associated with type 2 diabetes mellitus (HCC)   Dysphagia   Gastroesophageal reflux disease   Obstructive sleep apnea (adult) (pediatric)   Sleep disturbance   Type 2 diabetes mellitus with mild nonproliferative diabetic retinopathy without macular edema, bilateral (HCC)   Gastritis  1-Chest pain/epigastric pain Mild  elevation of troponin initially at 69 down to 3. Cardiology consulted and patient underwent coronary CTA  Recent barium swallow outpatient was negative Continue with PPI, Carafate. ECHO: Normal ejection fraction CT Coronary ; Moderate coronary diseases. Cardiology recommend medical management  Endoscopy: mild gastritis. GI recommend RUQ US. Which showed top normal caliber common bile duct.  -Hida scan order, depending on results might need Surgery evaluation.  -tramadol  didn't help with pain. Try vicodin.  Awaiting recommendation from Surgery and GI regarding next step in management.  Plan for NM Hepato EF.   2-Hypertension: Continue with metoprolol and lisinopril  3-Diabetes type 2 Continue with Farxiga Sliding scale insulin  3-Morbid  obesity: Needs lifestyle modification  4-HLD; continue with fenofibrate, Zocor.  UTI: continue with ceftriaxone Urine culture insignificant growth.  Completed 3 days antibiotics 5/19      Estimated body mass index is 50.78 kg/m as calculated from the following:   Height as of this encounter: 5' (1.524 m).   Weight as of this encounter: 117.9 kg.   DVT prophylaxis: Lovenox Code Status: Full code Family Communication: Care discussed with patient Disposition Plan:  Status is: Observation The patient remains OBS appropriate and will d/c before 2 midnights.    Consultants:  GI Cardiology   Procedures:  Coronary CT  Antimicrobials:    Subjective: She is still complaining of abdominal pain, epigastric pain.     Objective: Vitals:   08/25/21 0606 08/25/21 1454 08/25/21 2046 08/26/21 0538  BP: (!) 149/69 (!) 160/81 (!) 146/62 (!) 161/69  Pulse: (!) 58 64 62 (!) 58  Resp: 19 18 16 16   Temp: 98 F (36.7 C) 98 F (36.7 C) 98.3 F (36.8 C) 97.7 F (36.5 C)  TempSrc: Oral  Oral Oral  SpO2: 94% 97% 94% 94%  Weight:      Height:        Intake/Output Summary (Last 24 hours) at 08/26/2021 1340 Last data filed at 08/26/2021 1200 Gross per 24 hour  Intake 380 ml  Output 2 ml  Net 378 ml    Filed Weights   08/21/21 1037  Weight:  117.9 kg    Examination:  General exam: NAD Respiratory system: CTA Cardiovascular system: S 1, S 2 RRR Gastrointestinal system: BS present, soft, nt Central nervous system: Alert.  Extremities: No edema   Data Reviewed: I have personally reviewed following labs and imaging studies  CBC: Recent Labs  Lab 08/21/21 0523 08/21/21 0951 08/22/21 0748  08/23/21 0406 08/25/21 0406  WBC 6.2 5.3 4.9 4.2 4.5  HGB 10.7* 10.1* 11.0* 10.5* 10.3*  HCT 36.0 32.0* 35.8* 34.6* 34.8*  MCV 85.5 83.3 85.9 86.3 87.2  PLT 404* 360 346 317 355    Basic Metabolic Panel: Recent Labs  Lab 08/21/21 0523 08/21/21 0951 08/22/21 0717 08/23/21 0406 08/25/21 0406  NA 138  --  139 140 141  K 4.2  --  3.8 3.7 3.5  CL 103  --  104 106 105  CO2 26  --  26 25 25   GLUCOSE 99  --  111* 115* 109*  BUN 18  --  12 11 12   CREATININE 0.79 0.75 0.80 0.77 0.72  CALCIUM 9.3  --  8.8* 8.7* 9.3    GFR: Estimated Creatinine Clearance: 85.8 mL/min (by C-G formula based on SCr of 0.72 mg/dL). Liver Function Tests: Recent Labs  Lab 08/21/21 0523 08/24/21 0946  AST 35 32  ALT 25 35  ALKPHOS 33* 36*  BILITOT 0.7 0.8  PROT 7.9 8.1  ALBUMIN 4.1 4.0    Recent Labs  Lab 08/21/21 0523  LIPASE 35    No results for input(s): AMMONIA in the last 168 hours. Coagulation Profile: No results for input(s): INR, PROTIME in the last 168 hours. Cardiac Enzymes: Recent Labs  Lab 08/21/21 0706  CKTOTAL 52    BNP (last 3 results) No results for input(s): PROBNP in the last 8760 hours. HbA1C: No results for input(s): HGBA1C in the last 72 hours.  CBG: Recent Labs  Lab 08/25/21 1145 08/25/21 1629 08/25/21 2042 08/26/21 0736 08/26/21 1216  GLUCAP 146* 142* 186* 140* 142*    Lipid Profile: No results for input(s): CHOL, HDL, LDLCALC, TRIG, CHOLHDL, LDLDIRECT in the last 72 hours.  Thyroid Function Tests: No results for input(s): TSH, T4TOTAL, FREET4, T3FREE, THYROIDAB in the last 72 hours. Anemia Panel: No results for input(s): VITAMINB12, FOLATE, FERRITIN, TIBC, IRON, RETICCTPCT in the last 72 hours. Sepsis Labs: No results for input(s): PROCALCITON, LATICACIDVEN in the last 168 hours.  Recent Results (from the past 240 hour(s))  Urine Culture     Status: Abnormal   Collection Time: 08/22/21  5:27 PM   Specimen: In/Out Cath Urine  Result Value  Ref Range Status   Specimen Description   Final    IN/OUT CATH URINE Performed at [facility], [address] [place], [place]    Special Requests   Final    NONE Performed at [facility], [address], [place], [place]    Culture (A)  Final    <10,000 COLONIES/mL INSIGNIFICANT GROWTH Performed at [facility], [address] [place], [address] [place]    Report Status 08/23/2021 FINAL  Final         Radiology Studies: NM Hepatobiliary Liver Func  Result Date: 08/24/2021 CLINICAL DATA:  Abdominal pain for 3 weeks. Borderline biliary ductal dilatation on recent ultrasound. EXAM: NUCLEAR MEDICINE HEPATOBILIARY IMAGING TECHNIQUE: Sequential images of the abdomen were obtained out to 60 minutes following intravenous administration of radiopharmaceutical. Delayed image was also obtained at 150 minutes. RADIOPHARMACEUTICALS:  5.5 mCi Tc-30m  Choletec IV COMPARISON:  None Available. FINDINGS: Prompt uptake and biliary excretion of activity by the liver is seen. Gallbladder activity is visualized, consistent with patency of cystic duct. Biliary activity passes into small bowel, consistent with patent common bile duct. IMPRESSION: Normal hepatobiliary scan, demonstrating patency of both the cystic and common bile ducts. Electronically Signed   By: Danae Orleans M.D.   On: 08/24/2021 14:57        Scheduled Meds:  aspirin EC  81 mg Oral Daily   dapagliflozin propanediol  5 mg Oral QHS   enoxaparin (LOVENOX) injection  40 mg Subcutaneous Q24H   fenofibrate  160 mg Oral Daily   insulin aspart  0-15 Units Subcutaneous TID WC   insulin aspart  0-5 Units Subcutaneous QHS   lisinopril  20 mg Oral Daily   metoprolol succinate  50 mg Oral Daily   pantoprazole  40 mg Oral BID   Ensure Max Protein  11 oz Oral Daily   rosuvastatin  20 mg Oral Daily   sertraline  200 mg Oral Daily   sucralfate  1 g Oral TID WC & HS   Continuous  Infusions:  lactated ringers 10 mL/hr at 08/23/21 1152     LOS: 2 days    Time spent: 35 minutes.     Alba Cory, MD [facility]   If 7PM-7AM, please contact night-coverage [email or web address]  08/26/2021, 1:40 PM

## 2021-08-27 ENCOUNTER — Inpatient Hospital Stay (HOSPITAL_COMMUNITY): Payer: 59

## 2021-08-27 ENCOUNTER — Inpatient Hospital Stay (HOSPITAL_COMMUNITY): Payer: 59 | Admitting: Anesthesiology

## 2021-08-27 ENCOUNTER — Other Ambulatory Visit: Payer: Self-pay

## 2021-08-27 ENCOUNTER — Encounter (HOSPITAL_COMMUNITY)
Admission: EM | Disposition: A | Discharge status: Home / Self Care | Payer: Self-pay | Source: Home / Self Care | Attending: Internal Medicine

## 2021-08-27 ENCOUNTER — Encounter (HOSPITAL_COMMUNITY): Payer: Self-pay | Admitting: Gastroenterology

## 2021-08-27 DIAGNOSIS — K7581 Nonalcoholic steatohepatitis (NASH): Secondary | ICD-10-CM

## 2021-08-27 DIAGNOSIS — K660 Peritoneal adhesions (postprocedural) (postinfection): Secondary | ICD-10-CM

## 2021-08-27 DIAGNOSIS — Z87891 Personal history of nicotine dependence: Secondary | ICD-10-CM

## 2021-08-27 DIAGNOSIS — K811 Chronic cholecystitis: Secondary | ICD-10-CM

## 2021-08-27 DIAGNOSIS — Z01810 Encounter for preprocedural cardiovascular examination: Secondary | ICD-10-CM

## 2021-08-27 DIAGNOSIS — I10 Essential (primary) hypertension: Secondary | ICD-10-CM

## 2021-08-27 DIAGNOSIS — R079 Chest pain, unspecified: Secondary | ICD-10-CM | POA: Diagnosis not present

## 2021-08-27 HISTORY — PX: CHOLECYSTECTOMY: SHX55

## 2021-08-27 LAB — GLUCOSE, CAPILLARY
Glucose-Capillary: 143 mg/dL — ABNORMAL HIGH (ref 70–99)
Glucose-Capillary: 168 mg/dL — ABNORMAL HIGH (ref 70–99)
Glucose-Capillary: 166 mg/dL — ABNORMAL HIGH (ref 70–99)
Glucose-Capillary: 198 mg/dL — ABNORMAL HIGH (ref 70–99)

## 2021-08-27 LAB — BASIC METABOLIC PANEL
Anion gap: 8 (ref 5–15)
BUN: 14 mg/dL (ref 8–23)
CO2: 26 mmol/L (ref 22–32)
Calcium: 9.1 mg/dL (ref 8.9–10.3)
Chloride: 107 mmol/L (ref 98–111)
Creatinine, Ser: 0.79 mg/dL (ref 0.44–1.00)
GFR, Estimated: >60 mL/min (ref >60)
Glucose, Bld: 154 mg/dL — ABNORMAL HIGH (ref 70–99)
Potassium: 4 mmol/L (ref 3.5–5.1)
Sodium: 141 mmol/L (ref 135–145)

## 2021-08-27 LAB — CBC
HCT: 35.9 % — ABNORMAL LOW (ref 36.0–46.0)
Hemoglobin: 11.1 g/dL — ABNORMAL LOW (ref 12.0–15.0)
MCH: 26.4 pg (ref 26.0–34.0)
MCHC: 30.9 g/dL (ref 30.0–36.0)
MCV: 85.3 fL (ref 80.0–100.0)
Platelets: 296 10*3/uL (ref 150–400)
RBC: 4.21 MIL/uL (ref 3.87–5.11)
RDW: 14.9 % (ref 11.5–15.5)
WBC: 4.3 10*3/uL (ref 4.0–10.5)
nRBC: 0 % (ref 0.0–0.2)

## 2021-08-27 SURGERY — LAPAROSCOPIC CHOLECYSTECTOMY WITH INTRAOPERATIVE CHOLANGIOGRAM
Anesthesia: General

## 2021-08-27 MED ORDER — SIMETHICONE 40 MG/0.6ML PO SUSP: 80.0000 mg | Freq: Four times a day (QID) | ORAL | Status: DC | PRN

## 2021-08-27 MED ORDER — PROPOFOL 10 MG/ML IV BOLUS
INTRAVENOUS | Status: DC | PRN
  Administered 2021-08-27: 140 mg via INTRAVENOUS

## 2021-08-27 MED ORDER — ACETAMINOPHEN 325 MG PO TABS: 325.0000 mg | ORAL_TABLET | ORAL | Status: DC | PRN

## 2021-08-27 MED ORDER — OXYCODONE HCL 5 MG/5ML PO SOLN: 5.0000 mg | Freq: Once | ORAL | Status: DC | PRN

## 2021-08-27 MED ORDER — LIP MEDEX EX OINT
1.0000 "application " | TOPICAL_OINTMENT | Freq: Two times a day (BID) | CUTANEOUS | Status: DC
  Administered 2021-08-27 – 2021-08-28 (×2): 1 via TOPICAL
  Filled 2021-08-27: qty 7

## 2021-08-27 MED ORDER — BISACODYL 10 MG RE SUPP: 10.0000 mg | Freq: Two times a day (BID) | RECTAL | Status: DC | PRN

## 2021-08-27 MED ORDER — ENSURE PRE-SURGERY PO LIQD
296.0000 mL | Freq: Once | ORAL | Status: DC
  Filled 2021-08-27: qty 296

## 2021-08-27 MED ORDER — ACETAMINOPHEN 500 MG PO TABS
1000.0000 mg | ORAL_TABLET | ORAL | Status: AC
  Administered 2021-08-27: 1000 mg via ORAL
  Filled 2021-08-27: qty 2

## 2021-08-27 MED ORDER — DIPHENHYDRAMINE HCL 50 MG/ML IJ SOLN: 12.5000 mg | Freq: Four times a day (QID) | INTRAMUSCULAR | Status: DC | PRN

## 2021-08-27 MED ORDER — SODIUM CHLORIDE 0.9% FLUSH: 3.0000 mL | INTRAVENOUS | Status: DC | PRN

## 2021-08-27 MED ORDER — FENTANYL CITRATE (PF) 250 MCG/5ML IJ SOLN
INTRAMUSCULAR | Status: AC
  Filled 2021-08-27: qty 5

## 2021-08-27 MED ORDER — CALCIUM POLYCARBOPHIL 625 MG PO TABS
625.0000 mg | ORAL_TABLET | Freq: Two times a day (BID) | ORAL | Status: DC
  Administered 2021-08-27 – 2021-08-28 (×2): 625 mg via ORAL
  Filled 2021-08-27 (×2): qty 1

## 2021-08-27 MED ORDER — OXYCODONE HCL 5 MG PO TABS
ORAL_TABLET | ORAL | Status: AC
Start: 2021-08-27 — End: 2021-08-28
  Filled 2021-08-27: qty 1

## 2021-08-27 MED ORDER — DEXAMETHASONE SODIUM PHOSPHATE 10 MG/ML IJ SOLN
INTRAMUSCULAR | Status: AC
  Filled 2021-08-27: qty 1

## 2021-08-27 MED ORDER — MIDAZOLAM HCL 2 MG/2ML IJ SOLN
INTRAMUSCULAR | Status: AC
  Filled 2021-08-27: qty 2

## 2021-08-27 MED ORDER — CELECOXIB 200 MG PO CAPS
200.0000 mg | ORAL_CAPSULE | ORAL | Status: AC
  Administered 2021-08-27: 200 mg via ORAL
  Filled 2021-08-27: qty 1

## 2021-08-27 MED ORDER — ONDANSETRON HCL 4 MG/2ML IJ SOLN
INTRAMUSCULAR | Status: DC | PRN
  Administered 2021-08-27: 4 mg via INTRAVENOUS

## 2021-08-27 MED ORDER — FENTANYL CITRATE PF 50 MCG/ML IJ SOSY
25.0000 ug | PREFILLED_SYRINGE | INTRAMUSCULAR | Status: DC | PRN
  Administered 2021-08-27 (×2): 50 ug via INTRAVENOUS

## 2021-08-27 MED ORDER — IOPAMIDOL (ISOVUE-300) INJECTION 61%
INTRAVENOUS | Status: DC | PRN
  Administered 2021-08-27: 10 mL

## 2021-08-27 MED ORDER — SUGAMMADEX SODIUM 500 MG/5ML IV SOLN
INTRAVENOUS | Status: AC
  Filled 2021-08-27: qty 5

## 2021-08-27 MED ORDER — DEXAMETHASONE SODIUM PHOSPHATE 10 MG/ML IJ SOLN
INTRAMUSCULAR | Status: DC | PRN
  Administered 2021-08-27: 10 mg via INTRAVENOUS

## 2021-08-27 MED ORDER — PHENOL 1.4 % MT LIQD: 2.0000 | OROMUCOSAL | Status: DC | PRN

## 2021-08-27 MED ORDER — PROPOFOL 10 MG/ML IV BOLUS
INTRAVENOUS | Status: AC
  Filled 2021-08-27: qty 20

## 2021-08-27 MED ORDER — ACETAMINOPHEN 160 MG/5ML PO SOLN: 325.0000 mg | ORAL | Status: DC | PRN

## 2021-08-27 MED ORDER — ALUM & MAG HYDROXIDE-SIMETH 200-200-20 MG/5ML PO SUSP
30.0000 mL | Freq: Four times a day (QID) | ORAL | Status: DC | PRN
Start: 2021-08-27 — End: 2021-08-28

## 2021-08-27 MED ORDER — FENTANYL CITRATE (PF) 100 MCG/2ML IJ SOLN
INTRAMUSCULAR | Status: DC | PRN
  Administered 2021-08-27 (×5): 50 ug via INTRAVENOUS

## 2021-08-27 MED ORDER — PHENYLEPHRINE HCL (PRESSORS) 10 MG/ML IV SOLN
INTRAVENOUS | Status: DC | PRN
  Administered 2021-08-27: 80 ug via INTRAVENOUS
  Administered 2021-08-27: 160 ug via INTRAVENOUS
  Administered 2021-08-27: 240 ug via INTRAVENOUS
  Administered 2021-08-27: 160 ug via INTRAVENOUS

## 2021-08-27 MED ORDER — CHLORHEXIDINE GLUCONATE CLOTH 2 % EX PADS
6.0000 | MEDICATED_PAD | Freq: Once | CUTANEOUS | Status: AC
  Administered 2021-08-27: 6 via TOPICAL

## 2021-08-27 MED ORDER — BUPIVACAINE LIPOSOME 1.3 % IJ SUSP
INTRAMUSCULAR | Status: DC | PRN
  Administered 2021-08-27: 20 mL

## 2021-08-27 MED ORDER — LIDOCAINE HCL (PF) 2 % IJ SOLN
INTRAMUSCULAR | Status: AC
  Filled 2021-08-27: qty 5

## 2021-08-27 MED ORDER — LACTATED RINGERS IV BOLUS: 1000.0000 mL | Freq: Three times a day (TID) | INTRAVENOUS | Status: DC | PRN

## 2021-08-27 MED ORDER — LACTATED RINGERS IR SOLN
Status: DC | PRN
  Administered 2021-08-27: 1000 mL

## 2021-08-27 MED ORDER — GABAPENTIN 300 MG PO CAPS
300.0000 mg | ORAL_CAPSULE | ORAL | Status: AC
  Administered 2021-08-27: 300 mg via ORAL
  Filled 2021-08-27: qty 1

## 2021-08-27 MED ORDER — SODIUM CHLORIDE 0.9 % IV SOLN
2.0000 g | INTRAVENOUS | Status: AC
  Administered 2021-08-27: 2 g via INTRAVENOUS
  Filled 2021-08-27: qty 20

## 2021-08-27 MED ORDER — BUPIVACAINE-EPINEPHRINE 0.25% -1:200000 IJ SOLN
INTRAMUSCULAR | Status: DC | PRN
  Administered 2021-08-27: 30 mL

## 2021-08-27 MED ORDER — TECHNETIUM TC 99M MEBROFENIN IV KIT
5.0000 | PACK | Freq: Once | INTRAVENOUS | Status: AC | PRN
  Administered 2021-08-27: 5 via INTRAVENOUS

## 2021-08-27 MED ORDER — ROCURONIUM BROMIDE 100 MG/10ML IV SOLN
INTRAVENOUS | Status: DC | PRN
  Administered 2021-08-27: 20 mg via INTRAVENOUS
  Administered 2021-08-27: 40 mg via INTRAVENOUS
  Administered 2021-08-27: 10 mg via INTRAVENOUS

## 2021-08-27 MED ORDER — SUGAMMADEX SODIUM 500 MG/5ML IV SOLN
INTRAVENOUS | Status: DC | PRN
  Administered 2021-08-27: 400 mg via INTRAVENOUS

## 2021-08-27 MED ORDER — MENTHOL 3 MG MT LOZG: 1.0000 | LOZENGE | OROMUCOSAL | Status: DC | PRN

## 2021-08-27 MED ORDER — FENTANYL CITRATE PF 50 MCG/ML IJ SOSY
PREFILLED_SYRINGE | INTRAMUSCULAR | Status: AC
  Filled 2021-08-27: qty 3

## 2021-08-27 MED ORDER — SODIUM CHLORIDE 0.9 % IR SOLN
Status: DC | PRN
  Administered 2021-08-27: 1000 mL

## 2021-08-27 MED ORDER — ACETAMINOPHEN 10 MG/ML IV SOLN: 1000.0000 mg | Freq: Once | INTRAVENOUS | Status: DC | PRN

## 2021-08-27 MED ORDER — MIDAZOLAM HCL 5 MG/5ML IJ SOLN
INTRAMUSCULAR | Status: DC | PRN
  Administered 2021-08-27: 2 mg via INTRAVENOUS

## 2021-08-27 MED ORDER — SUCCINYLCHOLINE CHLORIDE 200 MG/10ML IV SOSY
PREFILLED_SYRINGE | INTRAVENOUS | Status: AC
  Filled 2021-08-27: qty 10

## 2021-08-27 MED ORDER — SUCCINYLCHOLINE CHLORIDE 200 MG/10ML IV SOSY
PREFILLED_SYRINGE | INTRAVENOUS | Status: DC | PRN
  Administered 2021-08-27: 170 mg via INTRAVENOUS

## 2021-08-27 MED ORDER — ROCURONIUM BROMIDE 10 MG/ML (PF) SYRINGE
PREFILLED_SYRINGE | INTRAVENOUS | Status: AC
  Filled 2021-08-27: qty 10

## 2021-08-27 MED ORDER — SODIUM CHLORIDE 0.9% FLUSH
3.0000 mL | Freq: Two times a day (BID) | INTRAVENOUS | Status: DC
  Administered 2021-08-27: 3 mL via INTRAVENOUS

## 2021-08-27 MED ORDER — SODIUM CHLORIDE 0.9 % IV SOLN
250.0000 mL | INTRAVENOUS | Status: DC | PRN
Start: 2021-08-27 — End: 2021-08-28

## 2021-08-27 MED ORDER — OXYCODONE HCL 5 MG PO TABS
5.0000 mg | ORAL_TABLET | Freq: Once | ORAL | Status: DC | PRN
  Administered 2021-08-27: 5 mg via ORAL

## 2021-08-27 MED ORDER — METHOCARBAMOL 1000 MG/10ML IJ SOLN: 1000.0000 mg | Freq: Four times a day (QID) | INTRAMUSCULAR | Status: DC | PRN

## 2021-08-27 MED ORDER — LIDOCAINE HCL (CARDIAC) PF 100 MG/5ML IV SOSY
PREFILLED_SYRINGE | INTRAVENOUS | Status: DC | PRN
  Administered 2021-08-27: 40 mg via INTRAVENOUS

## 2021-08-27 MED ORDER — BUPIVACAINE LIPOSOME 1.3 % IJ SUSP
INTRAMUSCULAR | Status: AC
  Filled 2021-08-27: qty 20

## 2021-08-27 MED ORDER — MAGIC MOUTHWASH: 15.0000 mL | Freq: Four times a day (QID) | ORAL | Status: DC | PRN

## 2021-08-27 MED ORDER — BUPIVACAINE LIPOSOME 1.3 % IJ SUSP: 20.0000 mL | Freq: Once | INTRAMUSCULAR | Status: DC

## 2021-08-27 MED ORDER — SODIUM CHLORIDE 0.9 % IV SOLN: INTRAVENOUS | Status: DC | PRN

## 2021-08-27 MED ORDER — ONDANSETRON HCL 4 MG/2ML IJ SOLN
INTRAMUSCULAR | Status: AC
  Filled 2021-08-27: qty 2

## 2021-08-27 MED ORDER — BUPIVACAINE-EPINEPHRINE (PF) 0.25% -1:200000 IJ SOLN
INTRAMUSCULAR | Status: AC
  Filled 2021-08-27: qty 30

## 2021-08-27 MED ORDER — ACETAMINOPHEN 500 MG PO TABS
1000.0000 mg | ORAL_TABLET | Freq: Four times a day (QID) | ORAL | Status: DC
  Administered 2021-08-27 – 2021-08-28 (×2): 1000 mg via ORAL
  Filled 2021-08-27 (×2): qty 2

## 2021-08-27 MED ORDER — AMISULPRIDE (ANTIEMETIC) 5 MG/2ML IV SOLN: 10.0000 mg | Freq: Once | INTRAVENOUS | Status: DC | PRN

## 2021-08-27 SURGICAL SUPPLY — 45 items
APPLIER CLIP 5 13 M/L LIGAMAX5 (MISCELLANEOUS)
APPLIER CLIP ROT 10 11.4 M/L (STAPLE)
BAG COUNTER SPONGE SURGICOUNT (BAG) ×2 IMPLANT
CABLE HIGH FREQUENCY MONO STRZ (ELECTRODE) IMPLANT
CLIP APPLIE 5 13 M/L LIGAMAX5 (MISCELLANEOUS) IMPLANT
CLIP APPLIE ROT 10 11.4 M/L (STAPLE) IMPLANT
COVER MAYO STAND XLG (MISCELLANEOUS) ×2 IMPLANT
COVER SURGICAL LIGHT HANDLE (MISCELLANEOUS) ×2 IMPLANT
DRAPE 3/4 80X56 (DRAPES) ×2 IMPLANT
DRAPE C-ARM 42X120 X-RAY (DRAPES) ×2 IMPLANT
DRAPE UTILITY XL STRL (DRAPES) ×2 IMPLANT
DRAPE WARM FLUID 44X44 (DRAPES) ×2 IMPLANT
DRSG TEGADERM 2-3/8X2-3/4 SM (GAUZE/BANDAGES/DRESSINGS) ×4 IMPLANT
DRSG TEGADERM 6X8 (GAUZE/BANDAGES/DRESSINGS) ×2 IMPLANT
ELECT REM PT RETURN 15FT ADLT (MISCELLANEOUS) ×2 IMPLANT
ENDOLOOP SUT PDS II  0 18 (SUTURE)
ENDOLOOP SUT PDS II 0 18 (SUTURE) IMPLANT
GLOVE ECLIPSE 8.0 STRL XLNG CF (GLOVE) ×2 IMPLANT
GLOVE INDICATOR 8.0 STRL GRN (GLOVE) ×2 IMPLANT
GOWN STRL REUS W/ TWL XL LVL3 (GOWN DISPOSABLE) ×3 IMPLANT
GOWN STRL REUS W/TWL XL LVL3 (GOWN DISPOSABLE) ×6
IRRIG SUCT STRYKERFLOW 2 WTIP (MISCELLANEOUS) ×2
IRRIGATION SUCT STRKRFLW 2 WTP (MISCELLANEOUS) ×1 IMPLANT
KIT BASIN OR (CUSTOM PROCEDURE TRAY) ×2 IMPLANT
KIT TURNOVER KIT A (KITS) IMPLANT
NDL BIOPSY 14GX4.5 SOFT TIS (NEEDLE) IMPLANT
NEEDLE BIOPSY 14GX4.5 SOFT TIS (NEEDLE) ×2 IMPLANT
PENCIL SMOKE EVACUATOR (MISCELLANEOUS) IMPLANT
POUCH RETRIEVAL ECOSAC 10 (ENDOMECHANICALS) ×1 IMPLANT
POUCH RETRIEVAL ECOSAC 10MM (ENDOMECHANICALS) ×2
SCISSORS LAP 5X35 DISP (ENDOMECHANICALS) ×2 IMPLANT
SET CHOLANGIOGRAPH MIX (MISCELLANEOUS) ×2 IMPLANT
SET TUBE SMOKE EVAC HIGH FLOW (TUBING) ×2 IMPLANT
SLEEVE Z-THREAD 5X100MM (TROCAR) IMPLANT
SPIKE FLUID TRANSFER (MISCELLANEOUS) ×2 IMPLANT
SUT MNCRL AB 4-0 PS2 18 (SUTURE) ×2 IMPLANT
SUT PDS AB 1 CT  36 (SUTURE)
SUT PDS AB 1 CT 36 (SUTURE) IMPLANT
SUT PDS AB 1 TP1 96 (SUTURE) ×1 IMPLANT
SYR 20ML LL LF (SYRINGE) ×2 IMPLANT
TOWEL OR 17X26 10 PK STRL BLUE (TOWEL DISPOSABLE) ×2 IMPLANT
TOWEL OR NON WOVEN STRL DISP B (DISPOSABLE) ×2 IMPLANT
TRAY LAPAROSCOPIC (CUSTOM PROCEDURE TRAY) ×2 IMPLANT
TROCAR XCEL NON-BLD 11X100MML (ENDOMECHANICALS) ×2 IMPLANT
TROCAR Z-THREAD OPTICAL 5X100M (TROCAR) ×2 IMPLANT

## 2021-08-27 NOTE — Anesthesia Postprocedure Evaluation (Signed)
Anesthesia Post Note  Patient: Lauren Schmidt  Procedure(s) Performed: LAPAROSCOPIC CHOLECYSTECTOMY WITH INTRAOPERATIVE CHOLANGIOGRAM, LYSIS OF ADHESIONS, AND LIVER BIOPSY     Patient location during evaluation: PACU Anesthesia Type: General Level of consciousness: awake and alert Pain management: pain level controlled Vital Signs Assessment: post-procedure vital signs reviewed and stable Respiratory status: spontaneous breathing, nonlabored ventilation, respiratory function stable and patient connected to nasal cannula oxygen Cardiovascular status: blood pressure returned to baseline and stable Postop Assessment: no apparent nausea or vomiting Anesthetic complications: no   No notable events documented.  Last Vitals:  Vitals:   08/27/21 1800 08/27/21 1820  BP: 139/79 (!) 154/66  Pulse: 72 65  Resp: (!) 22 20  Temp: 36.6 C 36.5 C  SpO2: 100% 100%    Last Pain:  Vitals:   08/27/21 1820  TempSrc: Oral  PainSc:                  Effie Berkshire

## 2021-08-27 NOTE — Op Note (Signed)
08/27/2021  PATIENT:  Lauren LofflerLinda Schmidt  61 y.o. female  Patient Care Team: Willis Modenautlaw, William, MD as Consulting Physician (Gastroenterology)  PRE-OPERATIVE DIAGNOSIS:    Chronic Cholecystitis  POST-OPERATIVE DIAGNOSIS:   Chronic Cholecystitis Fatty steatohepatitis Intra-abdominal adhesions with possible partial small bowel obstruction  PROCEDURE:  Diagnostic laparoscopy with lysis of adhesions Laparoscopic cholecystectomy with intraoperative cholangiogram (CPT code [id number]) Core liver biopsies  SURGEON:  Ardeth SportsmanSteven C. Rei Contee, MD, FACS.  ASSISTANT: Barnetta ChapelKelly Osborne, PA-C    ANESTHESIA:    General with endotracheal intubation Local anesthetic as a field block  EBL:  (See Anesthesia Intraoperative Record) Total I/O In: 1150 [I.V.:1050; IV Piggyback:100] Out: 25 [Blood:25]   Delay start of Pharmacological VTE agent (>24hrs) due to surgical blood loss or risk of bleeding:  no  DRAINS: None   SPECIMEN: Gallbladder & Core liver biopsies    DISPOSITION OF SPECIMEN:  PATHOLOGY  COUNTS:  YES  PLAN OF CARE: Admit to inpatient   PATIENT DISPOSITION:  PACU - hemodynamically stable.  INDICATION: Morbidly obese woman with epigastric abdominal pain.  Equivocal for cardiac etiology.  Admitted and aggressively worked up with no evidence of any significant cardiomegaly disease or infarction.  Consult with gastroenterology.  Upper endoscopy underwhelming.  Ultrasound showed questionable sludge but no gallbladder wall thickening cholecystitis.  Initial HIDA scan showed no cystic duct obstruction.  However patient had persistent symptoms and seemed to be worse with food and even liquids.  Gallbladder ejection fraction reduced with reproduction of systems with Ensure fatty stress p.o. trial on repeat HIDA scan.  Given the fact she has had persistent symptoms with no other etiologies to be determined, cholecystectomy offered.  The anatomy & physiology of hepatobiliary & pancreatic function was discussed.   The pathophysiology of gallbladder dysfunction was discussed.  Natural history risks without surgery was discussed.   I feel the risks of no intervention will lead to serious problems that outweigh the operative risks; therefore, I recommended cholecystectomy to remove the pathology.  I explained laparoscopic techniques with possible need for an open approach.  Probable cholangiogram to evaluate the bilary tract was explained as well.    Risks such as bleeding, infection, abscess, leak, injury to other organs, need for further treatment, heart attack, death, and other risks were discussed.  I noted a good likelihood this will help address the problem.  Possibility that this will not correct all abdominal symptoms was explained.  Goals of post-operative recovery were discussed as well.  We will work to minimize complications.  An educational handout further explaining the pathology and treatment options was given as well.  Questions were answered.  The patient expresses understanding & wishes to proceed with surgery.  OR FINDINGS: Patient had moderate omental adhesions to large periumbilical mesh.  No evidence of any hernia recurrence.  Numerous bands and stranding with some colon and small bowel trapped within it.  Dense adhesions to site of small bowel anastomosis with questionable transition zone.  Complete lysis adhesions interloop and abdominal wall done.  Small bowel run without any evidence any serosal injury or other abnormality and normal caliber.  No mass or tumor  Gallbladder with some edema and thickening suspicious for some chronic calculus cholecystitis.  Very intrahepatic gallbladder with enlarged liver with fatty change.  Cholecystectomy done.  Cholangiogram showing dilated common bile duct system but no distal common bile duct obstruction/stone/stricture/leak.  No evidence of choledocholithiasis.  Radiology agrees.  DESCRIPTION:   Informed consent was confirmed.  The patient underwent  general anaesthesia without  difficulty.  The patient was positioned appropriately.  VTE prevention in place.  The patient's abdomen was clipped, prepped, & draped in a sterile fashion.  Surgical timeout confirmed our plan.  Peritoneal entry with a laparoscopic port was obtained using optical entry technique in the right upper abdomen as the patient was positioned in reverse Trendelenburg.  Entry was clean.  I induced carbon dioxide insufflation.  Camera inspection revealed no injury.  Extra ports were carefully placed under direct laparoscopic visualization.  Did free off some omental adhesions to have safe visualization.  I turned attention to the right upper quadrant.  Gallbladder was tense with some edema suspicious for chronic cholecystitis.  The gallbladder fundus was elevated cephalad.   It was very intrahepatic with an large dominant floppy lingulae & lobes.   I used hook cautery to free the peritoneal coverings between the gallbladder and the liver on the posteriolateral and anteriomedial walls.   I used careful blunt and hook dissection to help get a good critical view of the cystic artery and cystic duct. I did further dissection to free 50%of the gallbladder off the liver bed to get a good critical view of the infundibulum and cystic duct.  I dissected out the cystic artery; and, after getting a good 360 view, ligated the anterior & posterior branches of the cystic artery close on the infundibulum using the Harmonic ultrasonic dissection.  I skeletonized the cystic duct.  I placed a clip on the infundibulum. I did a partial cystic duct-otomy and ensured patency. I placed a 5 [place] cholangiocatheter through a puncture site at the right subcostal ridge of the abdominal wall and directed it into the cystic duct.  We ran a cholangiogram with dilute radio-opaque contrast and continuous fluoroscopy. Contrast flowed from a side branch consistent with cystic duct cannulization. Contrast flowed up the  common hepatic duct into the right and left intrahepatic chains out to secondary radicals. Contrast flowed down the common bile duct easily across the normal ampulla into the duodenum.  While the intern asked hepatic biliary system seems somewhat dilated, there was no strong evidence of choledocholithiasis or obstruction.  No stricture or mass leak or other concerning abnormality.  This was consistent with a normal cholangiogram.  I removed the cholangiocatheter. I placed clips on the cystic duct x4.  I completed cystic duct transection. I freed the gallbladder from its remaining attachments to the liver. I ensured hemostasis on the gallbladder fossa of the liver and elsewhere. I inspected the rest of the abdomen & detected no injury nor bleeding elsewhere.  Placed the gallbladder inside and EcoSac and removed out the subxiphoid port with gentle dilation.  Plastic #1 PDS laparoscopically around the subxiphoid port site I clamped it.  Return the port.  Then focused on diagnostics proctoscopy to make sure there were no other other possible etiologies of pain since her gallbladder did not seem that bad.  Annitta Needs off some dense omental adhesions to the periumbilical and infraumbilical mesh.  Confirmed no injury.  Located the terminal ileum and ran the small bowel proximally towards the ligament of Treitz.  I freed off some moderate interloop adhesions.  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  Carefully freed off dense interloop adhesions.  Seemed like there was a transition zone near the region.  The anastomosis itself was open and patent.  Ran the small bowel more proximally and reinspected again.  No  serosal injury or other abnormality.  Caliber seen more normal now.  No mass or tumor.  Colon looked clear.  Reinspected the right upper quadrant confirmed good hemostasis without any abnormality.  Evacuated carbon oxide  and removed the ports.  I tied the #1 PDS fascial stitch down.  We did an aggressive block along the right abdomen subcostal ridge and especially around the subxiphoid largest port site where the gallbladder had been removed and primarily repaired.  I closed the skin using 4-0 monocryl stitch.  Sterile dressings were applied. The patient was extubated & arrived in the PACU in stable condition..  I had discussed postoperative care with the patient in the holding area. I discussed operative findings, updated the patient's status, discussed probable steps to recovery, and gave postoperative recommendations to the  patient's sister , Delcie Roch.  Recommendations were made.  Questions were answered.  She expressed understanding & appreciation.  Ardeth Sportsman, M.D., F.A.C.S. Gastrointestinal and Minimally Invasive Surgery [facility] [address], [address] [place], [place] [address] [phone] Main / Paging  08/27/2021 5:24 PM

## 2021-08-27 NOTE — Discharge Instructions (Signed)
################################################################  LAPAROSCOPIC SURGERY: POST OP INSTRUCTIONS  ######################################################################  EAT Gradually transition to a high fiber diet with a fiber supplement over the next few weeks after discharge.  Start with a pureed / full liquid diet (see below)  WALK Walk an hour a day.  Control your pain to do that.    CONTROL PAIN Control pain so that you can walk, sleep, tolerate sneezing/coughing, go up/down stairs.  HAVE A BOWEL MOVEMENT DAILY Keep your bowels regular to avoid problems.  OK to try a laxative to override constipation.  OK to use an antidairrheal to slow down diarrhea.  Call if not better after 2 tries  CALL IF YOU HAVE PROBLEMS/CONCERNS Call if you are still struggling despite following these instructions. Call if you have concerns not answered by these instructions  ######################################################################    DIET: Follow a light bland diet & liquids the first 24 hours after arrival home, such as soup, liquids, starches, etc.  Be sure to drink plenty of fluids.  Quickly advance to a usual solid diet within a few days.  Avoid fast food or heavy meals as your are more likely to get nauseated or have irregular bowels.  A low-fat, high-fiber diet for the rest of your life is ideal.  Take your usually prescribed home medications unless otherwise directed.  PAIN CONTROL: Pain is best controlled by a usual combination of three different methods TOGETHER: Ice/Heat Over the counter pain medication Prescription pain medication Most patients will experience some swelling and bruising around the incisions.  Ice packs or heating pads (30-60 minutes up to 6 times a day) will help. Use ice for the first few days to help decrease swelling and bruising, then switch to heat to help relax tight/sore spots and speed recovery.  Some people prefer to use ice alone, heat  alone, alternating between ice & heat.  Experiment to what works for you.  Swelling and bruising can take several weeks to resolve.   It is helpful to take an over-the-counter pain medication regularly for the first few weeks.  Choose one of the following that works best for you: Naproxen (Aleve, etc)  Two 220mg tabs twice a day Ibuprofen (Advil, etc) Three 200mg tabs four times a day (every meal & bedtime) Acetaminophen (Tylenol, etc) 500-650mg four times a day (every meal & bedtime) A  prescription for pain medication (such as oxycodone, hydrocodone, tramadol, gabapentin, methocarbamol, etc) should be given to you upon discharge.  Take your pain medication as prescribed.  If you are having problems/concerns with the prescription medicine (does not control pain, nausea, vomiting, rash, itching, etc), please call us [phone] to see if we need to switch you to a different pain medicine that will work better for you and/or control your side effect better. If you need a refill on your pain medication, please give us 48 hour notice.  contact your pharmacy.  They will contact our office to request authorization. Prescriptions will not be filled after 5 pm or on week-ends  Avoid getting constipated.   Between the surgery and the pain medications, it is common to experience some constipation.   Increasing fluid intake and taking a fiber supplement (such as Metamucil, Citrucel, FiberCon, MiraLax, etc) 1-2 times a day regularly will usually help prevent this problem from occurring.   A mild laxative (prune juice, Milk of Magnesia, MiraLax, etc) should be taken according to package directions if there are no bowel movements after 48 hours.   Watch out for diarrhea.     If you have many loose bowel movements, simplify your diet to bland foods & liquids for a few days.   Stop any stool softeners and decrease your fiber supplement.   Switching to mild anti-diarrheal medications (Kayopectate, Pepto Bismol) can  help.   If this worsens or does not improve, please call us.  Wash / shower every day.  You may shower over the dressings as they are waterproof.  Continue to shower over incision(s) after the dressing is off.  It is good for closed incisions and even open wounds to be washed every day.  Shower every day.  Short baths are fine.  Wash the incisions and wounds clean with soap & water.    You may leave closed incisions open to air if it is dry.   You may cover the incision with clean gauze & replace it after your daily shower for comfort.  TEGADERM:  You have clear gauze band-aid dressings over your closed incision(s).  Remove the dressings 3 days after surgery.    ACTIVITIES as tolerated:   You may resume regular (light) daily activities beginning the next day--such as daily self-care, walking, climbing stairs--gradually increasing activities as tolerated.  If you can walk 30 minutes without difficulty, it is safe to try more intense activity such as jogging, treadmill, bicycling, low-impact aerobics, swimming, etc. Save the most intensive and strenuous activity for last such as sit-ups, heavy lifting, contact sports, etc  Refrain from any heavy lifting or straining until you are off narcotics for pain control.   DO NOT PUSH THROUGH PAIN.  Let pain be your guide: If it hurts to do something, don't do it.  Pain is your body warning you to avoid that activity for another week until the pain goes down. You may drive when you are no longer taking prescription pain medication, you can comfortably wear a seatbelt, and you can safely maneuver your car and apply brakes. You may have sexual intercourse when it is comfortable.  FOLLOW UP in our office Please call [facility] at [phone] to set up an appointment to see your surgeon in the office for a follow-up appointment approximately 2-3 weeks after your surgery. Make sure that you call for this appointment the day you arrive home to insure a convenient  appointment time.  10. IF YOU HAVE DISABILITY OR FAMILY LEAVE FORMS, BRING THEM TO THE OFFICE FOR PROCESSING.  DO NOT GIVE THEM TO YOUR DOCTOR.   WHEN TO CALL US [phone]: Poor pain control Reactions / problems with new medications (rash/itching, nausea, etc)  Fever over 101.5 F (38.5 C) Inability to urinate Nausea and/or vomiting Worsening swelling or bruising Continued bleeding from incision. Increased pain, redness, or drainage from the incision   The clinic staff is available to answer your questions during regular business hours (8:30am-5pm).  Please don't hesitate to call and ask to speak to one of our nurses for clinical concerns.   If you have a medical emergency, go to the nearest emergency room or call 911.  A surgeon from [facility] is always on call at the hospitals   [facility] [address], [address], [place], [place]  [address] ? MAIN: [phone] ? TOLL FREE: [phone] ?  FAX [phone] [email or web address]  ##############################################################  

## 2021-08-27 NOTE — Progress Notes (Signed)
[facility] Gastroenterology Progress Note  Lauren Schmidt 61 y.o. 12-24-1960   Subjective: Patient seen and examined sitting in chair.  Patient reports she continues to have epigastric abdominal pain.  Symptoms are unchanged.  Next bowel movement this morning no melena or hematochezia.  ROS : Review of Systems  Gastrointestinal:  Positive for abdominal pain. Negative for blood in stool, constipation, diarrhea, heartburn, melena, nausea and vomiting.  Genitourinary:  Negative for dysuria and urgency.     Objective: Vital signs in last 24 hours: Vitals:   08/26/21 2009 08/27/21 0352  BP: 140/66 124/70  Pulse: 69 77  Resp: 18 16  Temp: 98.1 F (36.7 C) 98.3 F (36.8 C)  SpO2: 97% 95%    Physical Exam:  General:  Alert, cooperative, no distress, appears stated age, obese  Head:  Normocephalic, without obvious abnormality, atraumatic  Eyes:  Anicteric sclera, EOM's intact  Lungs:   Clear to auscultation bilaterally, respirations unlabored  Heart:  Regular rate and rhythm, S1, S2 normal  Abdomen:   Soft, diffuse tenderness but worsened in the epigastric region, bowel sounds active all four quadrants,  no masses,   Extremities: Extremities normal, atraumatic, no  edema  Pulses: 2+ and symmetric    Lab Results: Recent Labs    08/25/21 0406 08/27/21 0548  NA 141 141  K 3.5 4.0  CL 105 107  CO2 25 26  GLUCOSE 109* 154*  BUN 12 14  CREATININE 0.72 0.79  CALCIUM 9.3 9.1   Recent Labs    08/24/21 0946  AST 32  ALT 35  ALKPHOS 36*  BILITOT 0.8  PROT 8.1  ALBUMIN 4.0   Recent Labs    08/25/21 0406 08/27/21 0548  WBC 4.5 4.3  HGB 10.3* 11.1*  HCT 34.8* 35.9*  MCV 87.2 85.3  PLT 355 296   No results for input(s): LABPROT, INR in the last 72 hours.    Assessment Epigastric abdominal pain/chest pain Anemia   Patient with constant and worsening epigastric pain for 3 weeks.     CT abdomen pelvis 08/21/2021 Gas present in the lumen of urinary bladder No other  potential acute findings in the abdomen to account for symptoms Severe hepatic steatosis  Right upper quadrant ultrasound 08/24/2021 Top-normal caliber of the common bile duct. No signs of cholelithiasis or acute biliary process otherwise  HIDA scan 08/24/2021 Normal hepatobiliary scan, demonstrating patency of both the cystic and common bile ducts.  EGD 08/23/2021  mild gastritis unlikely to be cause of patient's symptoms.  Cardiac work-up unrevealing.  Possible symptoms due to gallbladder etiology.  Unlikely achalasia or motility disorder.  General surgery ordered HIDA scan with ejection fraction to evaluate for need of cholecystectomy.  Patient with hemoglobin of 10.7 on admission.  Last ED visit 08/09/2021 hemoglobin was 10.6.  Today hemoglobin 11.1 and stable.  This seems to be her baseline hemoglobin.  She denies any melena or hematochezia.  I do not suspect active bleeding.     Plan: Continue supportive care Consider outpatient esophageal manometry. Continue sucralfate 3 times daily and pantoprazole 40 mg twice daily No plans for further GI testing at this time. [facility] will follow.    Arvella Nigh Kobe Jansma PA-C 08/27/2021, 9:40 AM  Contact #  [phone]

## 2021-08-27 NOTE — Transfer of Care (Signed)
Immediate Anesthesia Transfer of Care Note  Patient: Lauren Schmidt  Procedure(s) Performed: LAPAROSCOPIC CHOLECYSTECTOMY WITH INTRAOPERATIVE CHOLANGIOGRAM, LYSIS OF ADHESIONS, AND LIVER BIOPSY  Patient Location: PACU  Anesthesia Type:General  Level of Consciousness: awake, alert , oriented and patient cooperative  Airway & Oxygen Therapy: Patient Spontanous Breathing and Patient connected to face mask oxygen  Post-op Assessment: Report given to RN, Post -op Vital signs reviewed and stable and Patient moving all extremities  Post vital signs: Reviewed and stable  Last Vitals:  Vitals Value Taken Time  BP 136/55 08/27/21 1725  Temp 36.6 C 08/27/21 1725  Pulse 74 08/27/21 1727  Resp 18 08/27/21 1727  SpO2 100 % 08/27/21 1727  Vitals shown include unvalidated device data.  Last Pain:  Vitals:   08/27/21 1725  TempSrc:   PainSc: 10-Worst pain ever      Patients Stated Pain Goal: 3 ([id number])  Complications: No notable events documented.

## 2021-08-27 NOTE — Progress Notes (Signed)
PROGRESS NOTE    Lauren Schmidt  IRJ:[id number] DOB: 1960-07-31 DOA: 08/21/2021 PCP: No primary care provider on file.   Brief Narrative: 61 year old with past medical history significant for hypertension, hyperlipidemia, diabetes type 2, morbid obesity presented with epigastric midsternal chest pain that is started 3 weeks prior to admission.  She initially presented to the ED 08/09/2021 and her work-up was negative.  She was discharged on Protonix and Carafate with follow-up with [facility].  Barium swallow was ordered 5/9 that was negative.  She presented with worsening symptoms.  Initial troponin was 69 and subsequently down to 3.  Cardiology was consulted and patient underwent coronary CT moderate diseases, medical treatment recommended.   GI  consulted, endoscopy showed mild gastritis, doesn't account for patient abdominal pain. [place] borderline caliber common bile duct. HIDA scan negative, Underwent HIDA scan with EF, which was positive. She was taken to OR for cholecystectomy.   Assessment & Plan:   Principal Problem:   Chest pain Active Problems:   Morbid obesity with BMI of 50.0-59.9, adult (HCC)   HLD (hyperlipidemia)   HTN (hypertension)   DM2 (diabetes mellitus, type 2) (HCC)   Abdominal pain, chronic, epigastric   Depressive disorder   Diabetic peripheral neuropathy associated with type 2 diabetes mellitus (HCC)   Dysphagia   Gastroesophageal reflux disease   Obstructive sleep apnea (adult) (pediatric)   Sleep disturbance   Type 2 diabetes mellitus with mild nonproliferative diabetic retinopathy without macular edema, bilateral (HCC)   Gastritis  1-Chest pain/epigastric pain Mild  elevation of troponin initially at 69 down to 3. Cardiology consulted and patient underwent coronary CTA  Recent barium swallow outpatient was negative Continue with PPI, Carafate. ECHO: Normal ejection fraction CT Coronary ; Moderate coronary diseases. Cardiology recommend medical management   Endoscopy: mild gastritis. GI recommend RUQ [place]. Which showed top normal caliber common bile duct.  -Hida scan order, depending on results might need Surgery evaluation.  -tramadol didn't help with pain. Try vicodin.  Awaiting recommendation from Surgery and GI regarding next step in management.  NM Hepato EF. 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  take to OR for Cholecystectomy 5/22.  2-Hypertension: Continue with metoprolol and lisinopril  3-Diabetes type 2 Continue with Farxiga Sliding scale insulin  3-Morbid  obesity: Needs lifestyle modification  4-HLD; continue with fenofibrate, Zocor.  UTI:  Urine culture insignificant growth.  Completed 3 days antibiotics 5/19      Estimated body mass index is 50.78 kg/m as calculated from the following:   Height as of this encounter: 5' (1.524 m).   Weight as of this encounter: 117.9 kg.   DVT prophylaxis: Lovenox Code Status: Full code Family Communication: Care discussed with patient Disposition Plan:  Status is: Observation The patient remains OBS appropriate and will d/c before 2 midnights.    Consultants:  GI Cardiology   Procedures:  Coronary CT  Antimicrobials:    Subjective: Still having pain severe today.   Objective: Vitals:   08/26/21 0538 08/26/21 2009 08/27/21 0352 08/27/21 1301  BP: (!) 161/69 140/66 124/70 (!) 171/76  Pulse: (!) 58 69 77 71  Resp: 16 18 16 18   Temp: 97.7 F (36.5 C) 98.1 F (36.7 C) 98.3 F (36.8 C) 98.4 F (36.9 C)  TempSrc: Oral Oral Oral Oral  SpO2: 94% 97% 95% 99%  Weight:      Height:        Intake/Output Summary (Last 24 hours) at 08/27/2021 1455 Last data filed at  08/27/2021 0900 Gross per 24 hour  Intake 620 ml  Output --  Net 620 ml    Filed Weights   08/21/21 1037  Weight: 117.9 kg    Examination:  General exam: NAD Respiratory system: CTA Cardiovascular system: S 1, S 2 RRR Gastrointestinal  system: BS present, soft, NT  Central nervous system: Alert, Non focal.  Extremities: No edema   Data Reviewed: I have personally reviewed following labs and imaging studies  CBC: Recent Labs  Lab 08/21/21 0951 08/22/21 0748 08/23/21 0406 08/25/21 0406 08/27/21 0548  WBC 5.3 4.9 4.2 4.5 4.3  HGB 10.1* 11.0* 10.5* 10.3* 11.1*  HCT 32.0* 35.8* 34.6* 34.8* 35.9*  MCV 83.3 85.9 86.3 87.2 85.3  PLT 360 346 317 355 296    Basic Metabolic Panel: Recent Labs  Lab 08/21/21 0523 08/21/21 0951 08/22/21 0717 08/23/21 0406 08/25/21 0406 08/27/21 0548  NA 138  --  139 140 141 141  K 4.2  --  3.8 3.7 3.5 4.0  CL 103  --  104 106 105 107  CO2 26  --  26 25 25 26   GLUCOSE 99  --  111* 115* 109* 154*  BUN 18  --  12 11 12 14   CREATININE 0.79 0.75 0.80 0.77 0.72 0.79  CALCIUM 9.3  --  8.8* 8.7* 9.3 9.1    GFR: Estimated Creatinine Clearance: 85.8 mL/min (by C-G formula based on SCr of 0.79 mg/dL). Liver Function Tests: Recent Labs  Lab 08/21/21 0523 08/24/21 0946  AST 35 32  ALT 25 35  ALKPHOS 33* 36*  BILITOT 0.7 0.8  PROT 7.9 8.1  ALBUMIN 4.1 4.0    Recent Labs  Lab 08/21/21 0523  LIPASE 35    No results for input(s): AMMONIA in the last 168 hours. Coagulation Profile: No results for input(s): INR, PROTIME in the last 168 hours. Cardiac Enzymes: Recent Labs  Lab 08/21/21 0706  CKTOTAL 52    BNP (last 3 results) No results for input(s): PROBNP in the last 8760 hours. HbA1C: No results for input(s): HGBA1C in the last 72 hours.  CBG: Recent Labs  Lab 08/26/21 1216 08/26/21 1652 08/26/21 2007 08/27/21 0733 08/27/21 1257  GLUCAP 142* 150* 186* 143* 168*    Lipid Profile: No results for input(s): CHOL, HDL, LDLCALC, TRIG, CHOLHDL, LDLDIRECT in the last 72 hours.  Thyroid Function Tests: No results for input(s): TSH, T4TOTAL, FREET4, T3FREE, THYROIDAB in the last 72 hours. Anemia Panel: No results for input(s): VITAMINB12, FOLATE, FERRITIN, TIBC,  IRON, RETICCTPCT in the last 72 hours. Sepsis Labs: No results for input(s): PROCALCITON, LATICACIDVEN in the last 168 hours.  Recent Results (from the past 240 hour(s))  Urine Culture     Status: Abnormal   Collection Time: 08/22/21  5:27 PM   Specimen: In/Out Cath Urine  Result Value Ref Range Status   Specimen Description   Final    IN/OUT CATH URINE Performed at [facility], [address] [place], [place] [address]    Special Requests   Final    NONE Performed at [facility], [address] [place], [place] [address]    Culture (A)  Final    <10,000 COLONIES/mL INSIGNIFICANT GROWTH Performed at [facility], [address] [place], [place] [address]    Report Status 08/23/2021 FINAL  Final         Radiology Studies: NM Hepato W/EF  Result Date: 08/27/2021 CLINICAL DATA:  Abdominal pain EXAM: NUCLEAR  MEDICINE HEPATOBILIARY IMAGING WITH GALLBLADDER EF TECHNIQUE: Sequential images of the abdomen were obtained out to 60 minutes following intravenous administration of radiopharmaceutical. After oral ingestion of Ensure, gallbladder ejection fraction was determined. At 60 min, normal ejection fraction is greater than 33%. RADIOPHARMACEUTICALS:  5.47 mCi Tc-29m  Choletec IV COMPARISON:  None Available. FINDINGS: Liver is enlarged in size. Prompt uptake and biliary excretion of activity by the liver is seen. Gallbladder activity is visualized, consistent with patency of cystic duct. Biliary activity passes into small bowel, consistent with patent common bile duct. Calculated gallbladder ejection fraction is 26%. (Normal gallbladder ejection fraction with Ensure is greater than 33%.) IMPRESSION: There is no obstruction of cystic duct or common bile duct. Estimated gallbladder ejection fraction with fatty meal is 26% which is lower than normal range suggesting possible gallbladder motility dysfunction. Electronically Signed   By: Ernie Avena M.D.   On: 08/27/2021 13:00        Scheduled Meds:  [MAR Hold] aspirin EC  81 mg Oral Daily   bupivacaine liposome  20 mL Infiltration Once   Chlorhexidine Gluconate Cloth  6 each Topical Once   [MAR Hold] dapagliflozin propanediol  5 mg Oral QHS   [MAR Hold] enoxaparin (LOVENOX) injection  40 mg Subcutaneous Q24H   [START ON 08/28/2021] feeding supplement  296 mL Oral Once   [MAR Hold] fenofibrate  160 mg Oral Daily   [MAR Hold] insulin aspart  0-15 Units Subcutaneous TID WC   [MAR Hold] insulin aspart  0-5 Units Subcutaneous QHS   [MAR Hold] lisinopril  20 mg Oral Daily   [MAR Hold] metoprolol succinate  50 mg Oral Daily   [MAR Hold] pantoprazole  40 mg Oral BID   [MAR Hold] Ensure Max Protein  11 oz Oral Daily   [MAR Hold] rosuvastatin  20 mg Oral Daily   [MAR Hold] sertraline  200 mg Oral Daily   [MAR Hold] sucralfate  1 g Oral TID WC & HS   Continuous Infusions:  cefTRIAXone (ROCEPHIN)  IV     lactated ringers 10 mL/hr at 08/23/21 1152     LOS: 3 days    Time spent: 35 minutes.     Alba Cory, MD [facility]   If 7PM-7AM, please contact night-coverage [email or web address]  08/27/2021, 2:55 PM

## 2021-08-27 NOTE — Anesthesia Preprocedure Evaluation (Addendum)
Anesthesia Evaluation  Patient identified by MRN, date of birth, ID band Patient awake    Reviewed: Allergy & Precautions, NPO status , Patient's Chart, lab work & pertinent test results  Airway Mallampati: III  TM Distance: >3 FB Neck ROM: Full    Dental  (+) Teeth Intact, Dental Advisory Given   Pulmonary sleep apnea , former smoker,    breath sounds clear to auscultation       Cardiovascular hypertension, Pt. on medications  Rhythm:Regular Rate:Normal  Echo: 1. Left ventricular ejection fraction, by estimation, is 60 to 65%. The  left ventricle has normal function. The left ventricle has no regional  wall motion abnormalities. Left ventricular diastolic parameters are  indeterminate.  2. Right ventricular systolic function is normal. The right ventricular  size is normal.  3. Left atrial size was mildly dilated.  4. The mitral valve is normal in structure. No evidence of mitral valve  regurgitation. No evidence of mitral stenosis.  5. The aortic valve is normal in structure. Aortic valve regurgitation is  not visualized. Aortic valve sclerosis is present, with no evidence of  aortic valve stenosis.  6. The inferior vena cava is normal in size with greater than 50%  respiratory variability, suggesting right atrial pressure of 3 mmHg.    Neuro/Psych PSYCHIATRIC DISORDERS Anxiety Depression    GI/Hepatic Neg liver ROS, hiatal hernia, GERD  Medicated,  Endo/Other  diabetes, Type 2, Oral Hypoglycemic Agents  Renal/GU negative Renal ROS     Musculoskeletal negative musculoskeletal ROS (+)   Abdominal (+) + obese,   Peds  Hematology   Anesthesia Other Findings   Reproductive/Obstetrics                            Anesthesia Physical Anesthesia Plan  ASA: 3  Anesthesia Plan: General   Post-op Pain Management:    Induction: Intravenous, Rapid sequence and Cricoid pressure  planned  PONV Risk Score and Plan: 4 or greater and Ondansetron, Dexamethasone, Midazolam and Scopolamine patch - Pre-op  Airway Management Planned: Oral ETT and Video Laryngoscope Planned  Additional Equipment: None  Intra-op Plan:   Post-operative Plan: Extubation in OR  Informed Consent: I have reviewed the patients History and Physical, chart, labs and discussed the procedure including the risks, benefits and alternatives for the proposed anesthesia with the patient or authorized representative who has indicated his/her understanding and acceptance.     Dental advisory given  Plan Discussed with: CRNA  Anesthesia Plan Comments: (Ensure @ 0930)       Anesthesia Quick Evaluation

## 2021-08-27 NOTE — TOC Progression Note (Signed)
Transition of Care [facility]) - Progression Note    Patient Details  Name: Lauren Schmidt MRN: [id number] Date of Birth: 1960-09-10  [facility] CM/SW Contact  Golda Acre, RN Phone Number: 08/27/2021, 8:30 AM  Clinical Narrative:    Following for toc needs   Expected Discharge Plan: Home/Self Care Barriers to Discharge: No Barriers Identified  Expected Discharge Plan and Services Expected Discharge Plan: Home/Self Care   Discharge Planning Services: CM Consult   Living arrangements for the past 2 months: Single Family Home                                       Social Determinants of Health (SDOH) Interventions    Readmission Risk Interventions     View : No data to display.

## 2021-08-27 NOTE — Progress Notes (Signed)
    4 Days Post-Op  Subjective: Patient still with epigastric abdominal pain radiating to her left chest.  No current N/V.  Had some vomiting once several days ago only.  Says that eating and even drinking waters seems to make it worse.  ROS: See above, otherwise other systems negative  Objective: Vital signs in last 24 hours: Temp:  [98.1 F (36.7 C)-98.3 F (36.8 C)] 98.3 F (36.8 C) (05/22 0352) Pulse Rate:  [69-77] 77 (05/22 0352) Resp:  [16-18] 16 (05/22 0352) BP: (124-140)/(66-70) 124/70 (05/22 0352) SpO2:  [95 %-97 %] 95 % (05/22 0352) Last BM Date : 08/26/21  Intake/Output from previous day: 05/21 0701 - 05/22 0700 In: 880 [P.O.:880] Out: -  Intake/Output this shift: No intake/output data recorded.  PE: Abd: soft, tender in epigastrium and some mild in RUQ, obese, ND  Lab Results:  Recent Labs    08/25/21 0406 08/27/21 0548  WBC 4.5 4.3  HGB 10.3* 11.1*  HCT 34.8* 35.9*  PLT 355 296   BMET Recent Labs    08/25/21 0406 08/27/21 0548  NA 141 141  K 3.5 4.0  CL 105 107  CO2 25 26  GLUCOSE 109* 154*  BUN 12 14  CREATININE 0.72 0.79  CALCIUM 9.3 9.1   PT/INR No results for input(s): LABPROT, INR in the last 72 hours. CMP     Component Value Date/Time   NA 141 08/27/2021 0548   K 4.0 08/27/2021 0548   CL 107 08/27/2021 0548   CO2 26 08/27/2021 0548   GLUCOSE 154 (H) 08/27/2021 0548   BUN 14 08/27/2021 0548   CREATININE 0.79 08/27/2021 0548   CALCIUM 9.1 08/27/2021 0548   PROT 8.1 08/24/2021 0946   ALBUMIN 4.0 08/24/2021 0946   AST 32 08/24/2021 0946   ALT 35 08/24/2021 0946   ALKPHOS 36 (L) 08/24/2021 0946   BILITOT 0.8 08/24/2021 0946   GFRNONAA >60 08/27/2021 0548   GFRAA >60 10/18/2016 1454   Lipase     Component Value Date/Time   LIPASE 35 08/21/2021 0523       Studies/Results: No results found.  Anti-infectives: Anti-infectives (From admission, onward)    Start     Dose/Rate Route Frequency Ordered Stop   08/21/21 1900   cefTRIAXone (ROCEPHIN) 1 g in sodium chloride 0.9 % 100 mL IVPB  Status:  Discontinued        1 g 200 mL/hr over 30 Minutes Intravenous Every 24 hours 08/21/21 1847 08/24/21 1444        Assessment/Plan Epigastric abdominal pain -extensive work up has been done. -HIDA with EF pending -will make further recommendations after this has returned regarding need for cholecystectomy. -multiple family members in the room during my visit and heard discussion and expected plans.   FEN - NPO for HIDA VTE - Lovenox, ASA ID - none  HTN DM Morbid obesity HLD UTI  I reviewed hospitalist notes, last 24 h vitals and pain scores, last 48 h intake and output, last 24 h labs and trends, and last 24 h imaging results.   LOS: 3 days    Letha Cape , [facility] 08/27/2021, 9:12 AM Please see Amion for pager number during day hours 7:00am-4:30pm or 7:00am -11:30am on weekends

## 2021-08-27 NOTE — Progress Notes (Addendum)
Note: Portions of this report may have been transcribed using voice recognition software. Every effort was made to ensure accuracy; however, inadvertent computerized transcription errors may be present.   Any transcriptional errors that result from this process are unintentional.              Lauren Schmidt  May 06, 1960 [id number]  Patient Care Team: Willis Modena, MD as Consulting Physician (Gastroenterology)  Nuclear medicine HIDA scan done.  Patient had worsening pain & felt like a severe attack.  Decreased gallbladder ejection fraction.  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  Again based on the lack of cardiopulmonary etiology, foregut etiology, other hepatic pancreatic biliary etiology; gallbladder is a more suspicious for the culprit.  Therefore as we discussed before if the HIDA scan was positive, I offered cholecystectomy.  Possible cholangiogram or liver biopsy depending on intraoperative findings.  We have time today.  Patient was given Ensure this morning.  However she is clearly more comfortable and has a Murphy sign now.  I am guarded against waiting another day.  Discussed with anesthesia.  Because surgery will be most likely 6 hours from administration of Ensure, should be safe to proceed.  The anatomy & physiology of hepatobiliary & pancreatic function was discussed.  The pathophysiology of gallbladder dysfunction was discussed.  Natural history risks without surgery was discussed.   I feel the risks of no intervention will lead to serious problems that outweigh the operative risks; therefore, I recommended cholecystectomy to remove the pathology.  I explained laparoscopic techniques with possible need for an open approach.  Probable cholangiogram to evaluate the bilary tract was explained as well.    Risks such as bleeding, infection, diarrhea and other bowel changes, abscess, leak, injury to other organs, need for repair of tissues / organs, need for  further treatment, stroke, heart attack, death, and other risks were discussed.  I noted a good likelihood this will help address the problem, but there is a chance it may not help.  Possibility that this will not correct all abdominal symptoms was explained.  Goals of post-operative recovery were discussed as well.  We will work to minimize complications.  An educational handout further explaining the pathology and treatment options was given as well.  Questions were answered.  The patient expresses understanding & wishes to proceed with surgery.    Patient Active Problem List   Diagnosis Date Noted   Diabetic peripheral neuropathy associated with type 2 diabetes mellitus (HCC) 08/25/2021   Dupuytren's contracture 08/25/2021   Dysphagia 08/25/2021   Gastroesophageal reflux disease 08/25/2021   Iron deficiency anemia 08/25/2021   Obstructive sleep apnea (adult) (pediatric) 08/25/2021   Sleep disturbance 08/25/2021   Type 2 diabetes mellitus with mild nonproliferative diabetic retinopathy without macular edema, bilateral (HCC) 08/25/2021   Gastritis 08/25/2021   Chest pain 08/21/2021   Morbid obesity with BMI of 50.0-59.9, adult (HCC) 08/21/2021   HLD (hyperlipidemia) 08/21/2021   HTN (hypertension) 08/21/2021   DM2 (diabetes mellitus, type 2) (HCC) 08/21/2021   Abdominal pain, chronic, epigastric 08/21/2021   Depressive disorder 03/09/2021   SBO (small bowel obstruction) (HCC) 05/14/2016   Postoperative wound infection 02/06/2013    Past Medical History:  Diagnosis Date   Anemia    Anxiety    Bowel obstruction (HCC) 05/14/2016   Depression    Diabetes mellitus without complication (HCC)    type 2   Family history of adverse reaction to anesthesia    sister's bp dropped low during surgery  Fatty liver    GERD (gastroesophageal reflux disease)    History of hiatal hernia    Hypercholesterolemia    Hypertension    Peripheral neuropathy    feet   Sleep apnea    uses cpap    Trigger thumb of both thumbs    Umbilical hernia with gangrene and obstruction 01/31/2013   Repaired on 01/31/13. SB resection done     Past Surgical History:  Procedure Laterality Date   BIOPSY  08/23/2021   Procedure: BIOPSY;  Surgeon: Arta Silence, MD;  Location: [facility];  Service: Gastroenterology;;   BOWEL RESECTION N/A 01/31/2013   Procedure: SMALL BOWEL RESECTION;  Surgeon: Merrie Roof, MD;  Location: [place];  Service: General;  Laterality: N/A;   COLON SURGERY     COLONOSCOPY     ESOPHAGOGASTRODUODENOSCOPY N/A 08/23/2021   Procedure: ESOPHAGOGASTRODUODENOSCOPY (EGD);  Surgeon: Arta Silence, MD;  Location: [facility];  Service: Gastroenterology;  Laterality: N/A;   HERNIA REPAIR     INSERTION OF MESH N/A 07/01/2016   Procedure: INSERTION OF MESH;  Surgeon: Autumn Messing III, MD;  Location: [place];  Service: General;  Laterality: N/A;   KNEE ARTHROSCOPY Right 1990s?   LAPAROSCOPIC ASSISTED VENTRAL HERNIA REPAIR  07/01/2016   Right Knee arthroscopy     TRIGGER FINGER RELEASE Right 10/24/2016   Procedure: RELEASE TRIGGER FINGER/A-1 PULLEY RIGHT THUMB TRIGGER RELEASE;  Surgeon: Leanora Cover, MD;  Location[place];  Service: Orthopedics;  Laterality: Right;   UMBILICAL HERNIA REPAIR N/A 01/31/2013   Procedure: HERNIA REPAIR UMBILICAL ADULT;  Surgeon: Merrie Roof, MD;  Location: [place];  Service: General;  Laterality: N/A;   VENTRAL HERNIA REPAIR N/A 07/01/2016   Procedure: LAPROSCOPIC ASSISTED VENTRAL HERNIA REPAIR;  Surgeon: Autumn Messing III, MD;  Location: [place];  Service: General;  Laterality: N/A;    Social History   Socioeconomic History   Marital status: Single    Spouse name: Not on file   Number of children: Not on file   Years of education: Not on file   Highest education level: Not on file  Occupational History   Not on file  Tobacco Use   Smoking status: Former   Smokeless tobacco: Never   Tobacco comments:    Quit smoking 30 years ago   Vaping Use   Vaping Use: Never used  Substance and Sexual Activity   Alcohol use: No   Drug use: No   Sexual activity: Yes  Other Topics Concern   Not on file  Social History Narrative   Not on file   Social Determinants of Health   Financial Resource Strain: Not on file  Food Insecurity: Not on file  Transportation Needs: Not on file  Physical Activity: Not on file  Stress: Not on file  Social Connections: Not on file  Intimate Partner Violence: Not on file    Family History  Problem Relation Age of Onset   Heart failure Mother    Aneurysm Mother    Diabetes Mother     Current Facility-Administered Medications  Medication Dose Route Frequency Provider Last Rate Last Admin   [START ON 08/28/2021] acetaminophen (TYLENOL) tablet 1,000 mg  1,000 mg Oral On Call to OR Michael Boston, MD       acetaminophen (TYLENOL) tablet 650 mg  650 mg Oral Q4H PRN Arta Silence, MD   650 mg at 08/26/21 0717   ALPRAZolam (XANAX) tablet 0.5 mg  0.5 mg Oral QHS PRN  Arta Silence, MD   0.5 mg at 08/21/21 2228   aspirin EC tablet 81 mg  81 mg Oral Daily Freada Bergeron, MD   81 mg at 08/27/21 1310   bupivacaine liposome (EXPAREL) 1.3 % injection 266 mg  20 mL Infiltration Once Michael Boston, MD       Derrill Memo ON 08/28/2021] cefTRIAXone (ROCEPHIN) 2 g in sodium chloride 0.9 % 100 mL IVPB  2 g Intravenous On Call to OR Michael Boston, MD       Derrill Memo ON 08/28/2021] celecoxib (CELEBREX) capsule 200 mg  200 mg Oral On Call to OR Michael Boston, MD       Chlorhexidine Gluconate Cloth 2 % PADS 6 each  6 each Topical Once Michael Boston, MD       And   Chlorhexidine Gluconate Cloth 2 % PADS 6 each  6 each Topical Once Aaran Enberg, Remo Lipps, MD       dapagliflozin propanediol (FARXIGA) tablet 5 mg  5 mg Oral QHS Arta Silence, MD   5 mg at 08/26/21 2143   enoxaparin (LOVENOX) injection 40 mg  40 mg Subcutaneous Q24H Arta Silence, MD   40 mg at 08/26/21 1050   [START ON 08/28/2021] feeding supplement  (ENSURE PRE-SURGERY) liquid 296 mL  296 mL Oral Once Michael Boston, MD       fenofibrate tablet 160 mg  160 mg Oral Daily Arta Silence, MD   160 mg at 08/26/21 1049   [START ON 08/28/2021] gabapentin (NEURONTIN) capsule 300 mg  300 mg Oral On Call to OR Michael Boston, MD       HYDROcodone-acetaminophen (NORCO/VICODIN) 5-325 MG per tablet 1 tablet  1 tablet Oral Q6H PRN Regalado, Belkys A, MD   1 tablet at 08/26/21 1851   insulin aspart (novoLOG) injection 0-15 Units  0-15 Units Subcutaneous TID WC Arta Silence, MD   3 Units at 08/27/21 1304   insulin aspart (novoLOG) injection 0-5 Units  0-5 Units Subcutaneous QHS Arta Silence, MD       lactated ringers infusion   Intravenous Continuous Arta Silence, MD 10 mL/hr at 08/23/21 1152 Restarted at 08/23/21 1250   lisinopril (ZESTRIL) tablet 20 mg  20 mg Oral Daily Arta Silence, MD   20 mg at 08/27/21 1310   metoprolol succinate (TOPROL-XL) 24 hr tablet 50 mg  50 mg Oral Daily Arta Silence, MD   50 mg at 08/27/21 1310   morphine (PF) 2 MG/ML injection 2-4 mg  2-4 mg Intravenous Q3H PRN Arta Silence, MD   2 mg at 08/25/21 1926   ondansetron (ZOFRAN) injection 4 mg  4 mg Intravenous Q6H PRN Arta Silence, MD   4 mg at 08/22/21 0912   pantoprazole (PROTONIX) EC tablet 40 mg  40 mg Oral BID Arta Silence, MD   40 mg at 08/27/21 1310   prochlorperazine (COMPAZINE) injection 10 mg  10 mg Intravenous Q6H PRN Arta Silence, MD       protein supplement (ENSURE MAX) liquid  11 oz Oral Daily Regalado, Belkys A, MD       rosuvastatin (CRESTOR) tablet 20 mg  20 mg Oral Daily Freada Bergeron, MD   20 mg at 08/27/21 1310   sertraline (ZOLOFT) tablet 200 mg  200 mg Oral Daily Arta Silence, MD   200 mg at 08/27/21 1310   sucralfate (CARAFATE) 1 GM/10ML suspension 1 g  1 g Oral TID WC & HS Arta Silence, MD   1 g at 08/27/21 1301  Allergies  Allergen Reactions   Nitrofurantoin Macrocrystal Shortness Of Breath, Itching, Swelling and  Anaphylaxis   Tape Other (See Comments)    Leave red marks and welts   Shellfish Allergy Rash    Rash     BP (!) 171/76 (BP Location: Left Arm)   Pulse 71   Temp 98.4 F (36.9 C) (Oral)   Resp 18   Ht 5' (1.524 m)   Wt 117.9 kg   SpO2 99%   BMI 50.78 kg/m   DG Chest 2 View  Result Date: 08/21/2021 CLINICAL DATA:  61 year old female with history of chest pain and upper abdominal pain intermittently for the past 3 weeks. EXAM: CHEST - 2 VIEW COMPARISON:  Chest x-ray 08/27/2011. FINDINGS: Lung volumes are low. No consolidative airspace disease. No pleural effusions. No pneumothorax. No pulmonary nodule or mass noted. Pulmonary vasculature and the cardiomediastinal silhouette are within normal limits. IMPRESSION: 1. Low lung volumes without radiographic evidence of acute cardiopulmonary disease. Electronically Signed   By: Vinnie Langton M.D.   On: 08/21/2021 05:52   NM Hepatobiliary Liver Func  Result Date: 08/24/2021 CLINICAL DATA:  Abdominal pain for 3 weeks. Borderline biliary ductal dilatation on recent ultrasound. EXAM: NUCLEAR MEDICINE HEPATOBILIARY IMAGING TECHNIQUE: Sequential images of the abdomen were obtained out to 60 minutes following intravenous administration of radiopharmaceutical. Delayed image was also obtained at 150 minutes. RADIOPHARMACEUTICALS:  5.5 mCi Tc-22m  Choletec IV COMPARISON:  None Available. FINDINGS: Prompt uptake and biliary excretion of activity by the liver is seen. Gallbladder activity is visualized, consistent with patency of cystic duct. Biliary activity passes into small bowel, consistent with patent common bile duct. IMPRESSION: Normal hepatobiliary scan, demonstrating patency of both the cystic and common bile ducts. Electronically Signed   By: Marlaine Hind M.D.   On: 08/24/2021 14:57   CT ABDOMEN PELVIS W CONTRAST  Result Date: 08/21/2021 CLINICAL DATA:  61 year old female with history of chest and upper abdominal pain for the past 3 weeks,  worsening this morning. EXAM: CT ABDOMEN AND PELVIS WITH CONTRAST TECHNIQUE: Multidetector CT imaging of the abdomen and pelvis was performed using the standard protocol following bolus administration of intravenous contrast. RADIATION DOSE REDUCTION: This exam was performed according to the departmental dose-optimization program which includes automated exposure control, adjustment of the mA and/or kV according to patient size and/or use of iterative reconstruction technique. CONTRAST:  159mL OMNIPAQUE IOHEXOL 300 MG/ML  SOLN COMPARISON:  CT of the abdomen and pelvis 08/09/2021. FINDINGS: Lower chest: Unremarkable. Hepatobiliary: Diffuse low attenuation throughout the hepatic parenchyma, indicative of a background of severe hepatic steatosis. No suspicious cystic or solid hepatic lesions. No intra or extrahepatic biliary ductal dilatation. Gallbladder is normal in appearance. Pancreas: No pancreatic mass. No pancreatic ductal dilatation. No pancreatic or peripancreatic fluid collections or inflammatory changes. Spleen: Unremarkable. Adrenals/Urinary Tract: In the lower pole of the right kidney there are 2 fat containing lesions, indicative of angiomyolipomas. The largest of these measures 2.3 x 1.4 cm (axial image 48 of series 2). Left kidney and bilateral adrenal glands are normal in appearance. No hydroureteronephrosis. Small amount of gas non dependently in the lumen of the urinary bladder. Urinary bladder is otherwise normal in appearance. Stomach/Bowel: The appearance of the stomach is normal. There is no pathologic dilatation of small bowel or colon. The appendix is not confidently identified and may be surgically absent. Regardless, there are no inflammatory changes noted adjacent to the cecum to suggest the presence of an acute appendicitis at this time.  Vascular/Lymphatic: Minimal aortic atherosclerosis, without evidence of aneurysm or dissection in the abdominal or pelvic vasculature. No lymphadenopathy  noted in the abdomen or pelvis. Reproductive: Small coarse calcifications in the fundus of the uterus, likely tiny calcified fibroids. Ovaries are unremarkable in appearance. Other: No significant volume of ascites.  No pneumoperitoneum. Musculoskeletal: There are no aggressive appearing lytic or blastic lesions noted in the visualized portions of the skeleton. IMPRESSION: 1. Gas is present in the lumen of the urinary bladder. This could be iatrogenic in the setting of recent catheterization for urinalysis. If there has been no recent catheterization, however, correlation with urinalysis would be recommended, as the possibility of urinary tract infection with gas-forming organisms is not excluded. 2. No other potential acute findings noted elsewhere in the abdomen or pelvis to account for the patient's symptoms. 3. Severe hepatic steatosis. 4. 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 5. Mild aortic atherosclerosis. Electronically Signed   By: Vinnie Langton M.D.   On: 08/21/2021 06:45   CT ABDOMEN PELVIS W CONTRAST  Result Date: 08/09/2021 CLINICAL DATA:  61 year old female with history of generalized abdominal pain and no recent bowel movement. Suspected bowel obstruction. EXAM: CT ABDOMEN AND PELVIS WITH CONTRAST TECHNIQUE: Multidetector CT imaging of the abdomen and pelvis was performed using the standard protocol following bolus administration of intravenous contrast. RADIATION DOSE REDUCTION: This exam was performed according to the departmental dose-optimization program which includes automated exposure control, adjustment of the mA and/or kV according to patient size and/or use of iterative reconstruction technique. CONTRAST:  118mL OMNIPAQUE IOHEXOL 300 MG/ML  SOLN COMPARISON:  CT of the abdomen and pelvis 08/23/2016. FINDINGS: Lower chest: Unremarkable. Hepatobiliary: Diffuse low attenuation throughout the hepatic parenchyma, indicative of  a background of severe hepatic steatosis. No discrete cystic or solid hepatic lesions. No intra or extrahepatic biliary ductal dilatation. Gallbladder is normal in appearance. Pancreas: No pancreatic mass. No pancreatic ductal dilatation. No pancreatic or peripancreatic fluid collections or inflammatory changes. Spleen: Unremarkable. Adrenals/Urinary Tract: In the lower pole of the right kidney there are 2 small lesions both of which contain fatty attenuation, indicative angiomyolipomas, largest of which measures 1.7 cm in diameter (coronal image 77 of series 5). Left kidney and bilateral adrenal glands are normal in appearance. No hydroureteronephrosis. Urinary bladder is normal in appearance. Stomach/Bowel: The appearance of the stomach is normal. There is no pathologic dilatation of small bowel or colon. Normal appendix. Vascular/Lymphatic: Aortic atherosclerosis, without evidence of aneurysm or dissection in the abdominal or pelvic vasculature. No lymphadenopathy noted in the abdomen or pelvis. Reproductive: Uterus and ovaries are unremarkable in appearance. Other: No significant volume of ascites noted in the visualized portions of the peritoneal cavity. Musculoskeletal: There are no aggressive appearing lytic or blastic lesions noted in the visualized portions of the skeleton. IMPRESSION: 1. No acute findings are noted in the abdomen or pelvis to account for the patient's symptoms. 2. Hepatic steatosis. 3. Two fat containing lesions in the lower pole of the right kidney, compatible with angiomyolipomas, largest of which measures 1.7 cm in diameter. Electronically Signed   By: Vinnie Langton M.D.   On: 08/09/2021 07:28   NM Hepato W/EF  Result Date: 08/27/2021 CLINICAL DATA:  Abdominal pain EXAM: NUCLEAR MEDICINE HEPATOBILIARY IMAGING WITH GALLBLADDER EF TECHNIQUE: Sequential images of the abdomen were obtained out to 60 minutes following intravenous administration of radiopharmaceutical. After oral  ingestion of Ensure, gallbladder ejection fraction was determined.  At 60 min, normal ejection fraction is greater than 33%. RADIOPHARMACEUTICALS:  5.47 mCi Tc-61m  Choletec IV COMPARISON:  None Available. FINDINGS: Liver is enlarged in size. Prompt uptake and biliary excretion of activity by the liver is seen. Gallbladder activity is visualized, consistent with patency of cystic duct. Biliary activity passes into small bowel, consistent with patent common bile duct. Calculated gallbladder ejection fraction is 26%. (Normal gallbladder ejection fraction with Ensure is greater than 33%.) IMPRESSION: There is no obstruction of cystic duct or common bile duct. Estimated gallbladder ejection fraction with fatty meal is 26% which is lower than normal range suggesting possible gallbladder motility dysfunction. Electronically Signed   By: Elmer Picker M.D.   On: 08/27/2021 13:00   CT CORONARY MORPH W/CTA COR W/SCORE W/CA W/CM &/OR WO/CM  Addendum Date: 08/22/2021   ADDENDUM REPORT: 08/22/2021 14:32 HISTORY: Chest pain/anginal equiv, intermediate CAD risk, not treadmill candidate EXAM: Cardiac/Coronary  CT TECHNIQUE: The patient was scanned on a [employer]. PROTOCOL: A 120 kV prospective scan was triggered in the descending thoracic aorta at 111 HU's. Axial non-contrast 3 mm slices were carried out through the heart. The data set was analyzed on a dedicated work station and scored using the Agatston method. Gantry rotation speed was 250 msecs and collimation was .6 mm. Beta blockade and 0.8 mg of sl NTG was given. The 3D data set was reconstructed in 5% intervals of the 35-75 % of the R-R cycle. Systolic and diastolic phases were analyzed on a dedicated work station using MPR, MIP and VRT modes. The patient received 139mL OMNIPAQUE IOHEXOL 350 MG/ML SOLN contrast. FINDINGS: Image quality: Poor Noise artifact is: Moderate, signal to noise ratio is reduced. Coronary calcium score is 107, which places the  patient in the 87th percentile for age and sex matched control. Coronary arteries: Normal coronary origins.  Right dominance. Right Coronary Artery: Mild mixed atherosclerotic plaque in the proximal RCA, 25-49% stenosis. Moderate atherosclerotic plaque in the mid and distal RCA, 50-69% stenosis. Left Main Coronary Artery: No detectable plaque or stenosis. Left Anterior Descending Coronary Artery: Minimal scattered mixed atherosclerotic plaque in the LAD, <25% stenosis. Moderate atherosclerotic plaque in the distal LAD, 50-69% stenosis. Image quality is suboptimal to definitely assess lumen, probable moderate stenosis in the proximal first diagonal artery, 50-69% stenosis. Left Circumflex Artery: Small first OM with calcified plaque, may have stenosis but unable to determine definitively. No other detectable plaque or stenosis in LCx. Aorta: Normal size, 30 mm at the mid ascending aorta (level of the PA bifurcation) measured double oblique. No calcifications. No dissection. Aortic Valve: No calcifications. Other findings: Normal pulmonary vein drainage into the left atrium. Normal left atrial appendage without thrombus. Normal size of the pulmonary artery. IMPRESSION: 1. 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 CT FFR will be performed and reported separately. 2. Coronary calcium score is 107, which places the patient in the Makena percentile for age and sex matched control. 3. Normal coronary origins with right dominance. Electronically Signed   By: Cherlynn Kaiser M.D.   On: 08/22/2021 14:32   Result Date: 08/22/2021 EXAM: OVER-READ INTERPRETATION  CT CHEST The following report is a limited chest CT over-read performed by radiologist Dr. Abigail Miyamoto of [facility], [place] on 08/22/2021. This over-read does not include interpretation of cardiac or coronary anatomy or pathology. The coronary CTA interpretation by the cardiologist is attached. COMPARISON:   CTA chest of 1 day  prior FINDINGS: Vascular: Aortic atherosclerosis. No central pulmonary embolism, on this non-dedicated study. Mediastinum/Nodes: No imaged thoracic adenopathy. Lungs/Pleura: No pleural fluid. Tiny pulmonary nodules were detailed on yesterday's dedicated diagnostic chest CT. No superimposed acute lobar consolidation. Upper Abdomen: Hepatic steatosis. Normal imaged portions of the spleen, stomach. Musculoskeletal: No acute osseous abnormality. Lower thoracic spondylosis. IMPRESSION: No acute findings in the imaged extracardiac chest. Aortic Atherosclerosis (ICD10-I70.0). Hepatic steatosis. Electronically Signed: By: Abigail Miyamoto M.D. On: 08/22/2021 11:53   DG UGI W DOUBLE CM (HD BA)  Result Date: 08/17/2021 CLINICAL DATA:  A 60 year old female presents with chest discomfort while swallowing. EXAM: UPPER GI SERIES WITH KUB TECHNIQUE: After obtaining a scout radiograph a routine upper GI series was performed using thin and high density barium. FLUOROSCOPY TIME:  Fluoroscopy Time:  2 minutes 48 seconds Radiation Exposure Index (if provided by the fluoroscopic device): 72 mGy Number of Acquired Spot Images: 1 COMPARISON:  CT of the abdomen and pelvis from Aug 09, 2021. FINDINGS: Scout film of the abdomen without signs of obstruction. Thin barium was administered for assessment of swallow function, Uldine Fuster swallow function without signs of Elli Groesbeck abnormality related to swallowing. Effervescent crystals and thick barium was then administered showing normal distensibility of the esophagus. No visible lesion or signs of esophageal stricture. Study mildly compromised by patient body habitus. Contrast flowed into the stomach. After coating the stomach images were obtained which shows no Tarus Briski fold thickening or gastric lesion. Contrast flowed readily into the duodenum with normal appearance of duodenal bulb and normal duodenal jejunal anatomy. Assessment of esophageal motility shows normal primary wave with  complete stripping of the esophagus due to primary peristalsis. Full column assessment with normal caliber of the esophagus, small hiatal hernia. Barium tablet administered and passed without difficulty into the stomach through the esophagus. No gastroesophageal reflux was elicited with positioning or water siphon. IMPRESSION: Small hiatal hernia otherwise normal upper GI evaluation. Patient did experience discomfort with swallowing and related a globus sensation with the pill which passed readily from the esophagus into the stomach. Electronically Signed   By: Zetta Bills M.D.   On: 08/17/2021 11:17   CT CORONARY FRACTIONAL FLOW RESERVE DATA PREP  Result Date: 08/22/2021 EXAM: CT FFR ANALYSIS CLINICAL DATA:  abnormal coronary CT FINDINGS: FFRct analysis was performed on the original cardiac CT angiogram dataset. Diagrammatic representation of the FFRct analysis is provided in a separate PDF document in PACS. This dictation was created using the PDF document and an interactive 3D model of the results. 3D model is not available in the EMR/PACS. Normal FFR range is >0.80. Indeterminate (grey) zone is 0.76-0.80. FFR delta of 0.13 is considered significant. 1. Left Main: FFR = 0.98 2. LAD: Proximal FFR = 0.94, mid FFR = 0.90, Distal FFR = 0.56, first diagonal FFR = 0.81 3. LCX: Proximal FFR = 0.96, distal FFR = 0.93, OM1 FFR = 0.80 4. RCA: Proximal FFR = 0.96, mid FFR =0.93, Distal FFR = 0.81 IMPRESSION: 1. 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 The vessel is small caliber <54mm in this location. No other hemodynamically significant stenoses. RECOMMENDATIONS: Guideline-directed medical therapy and aggressive risk factor modification for secondary prevention of coronary artery disease. Electronically Signed   By: Cherlynn Kaiser M.D.   On: 08/22/2021 14:41   CT ANGIO CHEST AORTA W/CM &/OR WO/CM  Result Date: 08/21/2021 CLINICAL DATA:  61 year old female with chest and upper  abdominal pain for the past 3 weeks, worsening this  morning. Suspected acute aortic syndrome. EXAM: CT ANGIOGRAPHY CHEST WITH CONTRAST TECHNIQUE: Multidetector CT imaging of the chest was performed using the standard protocol during bolus administration of intravenous contrast. Multiplanar CT image reconstructions and MIPs were obtained to evaluate the vascular anatomy. RADIATION DOSE REDUCTION: This exam was performed according to the departmental dose-optimization program which includes automated exposure control, adjustment of the mA and/or kV according to patient size and/or use of iterative reconstruction technique. CONTRAST:  70mL OMNIPAQUE IOHEXOL 350 MG/ML SOLN COMPARISON:  No priors. FINDINGS: Cardiovascular: Precontrast images demonstrate no crescentic high attenuation associated with the wall of the thoracic aorta to suggest the presence of acute intramural hemorrhage. Mild atherosclerosis is noted in the aortic arch. No aneurysm or dissection of the thoracic aorta or the great vessels of the mediastinum. Ascending thoracic aorta, mid arch and descending thoracic aorta measure 2.7 cm, 2.4 cm and 2.5 cm in diameter respectively. No definite coronary artery calcifications. Heart size is normal. There is no significant pericardial fluid, thickening or pericardial calcification. Mediastinum/Nodes: No pathologically enlarged mediastinal or hilar lymph nodes. Esophagus is unremarkable in appearance. No axillary lymphadenopathy. Lungs/Pleura: No pneumothorax. No acute consolidative airspace disease. No pleural effusions. A few scattered tiny 2-3 mm pulmonary nodules are noted in the periphery of the lungs, nonspecific, but statistically likely benign areas of mucoid impaction within terminal bronchioles. No larger more suspicious appearing pulmonary nodules or masses are noted. Upper Abdomen: Diffuse low attenuation throughout the visualized hepatic parenchyma, indicative of severe hepatic steatosis.  Musculoskeletal: There are no aggressive appearing lytic or blastic lesions noted in the visualized portions of the skeleton. Review of the MIP images confirms the above findings. IMPRESSION: 1. No acute findings are noted in the thorax to account for the patient's symptoms. Specifically, no evidence of acute aortic syndrome. 2. Tiny 2-3 mm pulmonary nodules in the lungs bilaterally, nonspecific, but statistically likely benign. No follow-up needed if patient is low-risk (and has no known or suspected primary neoplasm). Non-contrast chest CT can be considered in 12 months if patient is high-risk. This recommendation follows the consensus statement: Guidelines for Management of Incidental Pulmonary Nodules Detected on CT Images: From the Fleischner Society 2017; Radiology 2017; 284:228-243. 3. Aortic atherosclerosis (mild). 4. Severe hepatic steatosis. Aortic Atherosclerosis (ICD10-I70.0). Electronically Signed   By: Vinnie Langton M.D.   On: 08/21/2021 07:28   ECHOCARDIOGRAM COMPLETE  Result Date: 08/21/2021    ECHOCARDIOGRAM REPORT   Patient Name:   Lauren Schmidt Date of Exam: 08/21/2021 Medical Rec #:  [id number]    Height:       60.0 in Accession #:    [id number]   Weight:       260.0 lb Date of Birth:  08-14-1960    BSA:          2.087 m Patient Age:    10 years     BP:           153/63 mmHg Patient Gender: F            HR:           70 bpm. Exam Location:  Inpatient Procedure: 2D Echo, Color Doppler, Cardiac Doppler and Intracardiac            Opacification Agent Indications:    Chest pain  History:        Patient has no prior history of Echocardiogram examinations.                 Signs/Symptoms:Chest Pain; Risk  Factors:Hypertension, Diabetes                 and HLD.  Sonographer:    Joette Catching RCS Referring Phys: [id number] Jonnie Finner  Sonographer Comments: Image acquisition challenging due to patient body habitus. IMPRESSIONS  1. Left ventricular ejection fraction, by estimation, is 60 to 65%. The  left ventricle has normal function. The left ventricle has no regional wall motion abnormalities. Left ventricular diastolic parameters are indeterminate.  2. Right ventricular systolic function is normal. The right ventricular size is normal.  3. Left atrial size was mildly dilated.  4. The mitral valve is normal in structure. No evidence of mitral valve regurgitation. No evidence of mitral stenosis.  5. The aortic valve is normal in structure. Aortic valve regurgitation is not visualized. Aortic valve sclerosis is present, with no evidence of aortic valve stenosis.  6. The inferior vena cava is normal in size with greater than 50% respiratory variability, suggesting right atrial pressure of 3 mmHg. FINDINGS  Left Ventricle: Left ventricular ejection fraction, by estimation, is 60 to 65%. The left ventricle has normal function. The left ventricle has no regional wall motion abnormalities. Definity contrast agent was given IV to delineate the left ventricular  endocardial borders. The left ventricular internal cavity size was normal in size. There is no left ventricular hypertrophy. Left ventricular diastolic parameters are indeterminate. Right Ventricle: The right ventricular size is normal. No increase in right ventricular wall thickness. Right ventricular systolic function is normal. Left Atrium: Left atrial size was mildly dilated. Right Atrium: Right atrial size was normal in size. Pericardium: There is no evidence of pericardial effusion. Mitral Valve: The mitral valve is normal in structure. No evidence of mitral valve regurgitation. No evidence of mitral valve stenosis. Tricuspid Valve: The tricuspid valve is normal in structure. Tricuspid valve regurgitation is not demonstrated. No evidence of tricuspid stenosis. Aortic Valve: The aortic valve is normal in structure. Aortic valve regurgitation is not visualized. Aortic valve sclerosis is present, with no evidence of aortic valve stenosis. Aortic valve mean  gradient measures 5.0 mmHg. Aortic valve peak gradient measures 8.9 mmHg. Aortic valve area, by VTI measures 2.22 cm. Pulmonic Valve: The pulmonic valve was normal in structure. Pulmonic valve regurgitation is not visualized. No evidence of pulmonic stenosis. Aorta: The aortic root is normal in size and structure. Venous: The inferior vena cava is normal in size with greater than 50% respiratory variability, suggesting right atrial pressure of 3 mmHg. IAS/Shunts: No atrial level shunt detected by color flow Doppler.  LEFT VENTRICLE PLAX 2D LVIDd:         4.10 cm      Diastology LVIDs:         3.00 cm      LV e' medial:    6.10 cm/s LV PW:         0.80 cm      LV E/e' medial:  15.5 LV IVS:        0.90 cm      LV e' lateral:   7.62 cm/s LVOT diam:     1.90 cm      LV E/e' lateral: 12.4 LV SV:         68 LV SV Index:   33 LVOT Area:     2.84 cm  LV Volumes (MOD) LV vol d, MOD A2C: 129.0 ml LV vol d, MOD A4C: 97.0 ml LV vol s, MOD A2C: 59.3 ml LV vol s, MOD A4C: 40.6  ml LV SV MOD A2C:     69.7 ml LV SV MOD A4C:     97.0 ml LV SV MOD BP:      60.5 ml RIGHT VENTRICLE RV Basal diam:  3.40 cm RV S prime:     13.90 cm/s TAPSE (M-mode): 2.3 cm LEFT ATRIUM             Index        RIGHT ATRIUM           Index LA diam:        3.90 cm 1.87 cm/m   RA Area:     11.50 cm LA Vol (A2C):   55.6 ml 26.64 ml/m  RA Volume:   21.30 ml  10.21 ml/m LA Vol (A4C):   74.2 ml 35.56 ml/m LA Biplane Vol: 68.5 ml 32.83 ml/m  AORTIC VALVE                     PULMONIC VALVE AV Area (Vmax):    2.15 cm      PV Vmax:       0.91 m/s AV Area (Vmean):   2.16 cm      PV Peak grad:  3.3 mmHg AV Area (VTI):     2.22 cm AV Vmax:           149.00 cm/s AV Vmean:          107.000 cm/s AV VTI:            0.308 m AV Peak Grad:      8.9 mmHg AV Mean Grad:      5.0 mmHg LVOT Vmax:         113.00 cm/s LVOT Vmean:        81.600 cm/s LVOT VTI:          0.241 m LVOT/AV VTI ratio: 0.78  AORTA Ao Root diam: 3.30 cm Ao Asc diam:  3.00 cm MITRAL VALVE MV Area  (PHT): 4.89 cm    SHUNTS MV Decel Time: 155 msec    Systemic VTI:  0.24 m MV E velocity: 94.70 cm/s  Systemic Diam: 1.90 cm MV A velocity: 95.50 cm/s MV E/A ratio:  0.99 MV A Prime:    10.4 cm/s Dani Gobble Croitoru MD Electronically signed by Sanda Klein MD Signature Date/Time: 08/21/2021/2:41:55 PM    Final    US Abdomen Limited RUQ (LIVER/GB)  Result Date: 08/24/2021 CLINICAL DATA:  Abdominal pain. EXAM: ULTRASOUND ABDOMEN LIMITED RIGHT UPPER QUADRANT COMPARISON:  CT imaging from Aug 21, 2021. FINDINGS: Gallbladder: Small amount of sludge in the dependent gallbladder. No gallbladder distension or wall thickening. No pericholecystic fluid. Common bile duct: Diameter: 6 mm Liver: Moderate hepatic steatosis. Fatty sparing near the gallbladder fossa. Portal vein is patent on color Doppler imaging with normal direction of blood flow towards the liver. 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 IMPRESSION: Top-normal caliber of the common bile duct. No signs of cholelithiasis or acute biliary process otherwise. Correlate with laboratory values and clinical presentation to determine whether MRI/MRCP may be warranted. Moderate hepatic steatosis. RIGHT renal angiomyolipomas. Electronically Signed   By: Zetta Bills M.D.   On: 08/24/2021 08:47      Adin Hector, MD, FACS, MASCRS Esophageal, Gastrointestinal & Colorectal Surgery Robotic and Minimally Invasive Surgery  [facility], [place] [place], [place] [address] [phone] Fax [phone] Main  CONTACT INFORMATION:  Weekday (9AM-5PM): Call [facility] main office at [phone]  Weeknight (5PM-9AM) or Weekend/Holiday: Check [email or web address] (password " [email or web address]") for General Surgery [facility] coverage  (Please, do not use SecureChat as it is not reliable communication to operating surgeons for immediate patient care)

## 2021-08-27 NOTE — Anesthesia Procedure Notes (Signed)
Procedure Name: Intubation Date/Time: 08/27/2021 3:28 PM Performed by: Lucinda Dell, CRNA Pre-anesthesia Checklist: Patient identified, Emergency Drugs available, Suction available and Patient being monitored Patient Re-evaluated:Patient Re-evaluated prior to induction Oxygen Delivery Method: Circle System Utilized Preoxygenation: Pre-oxygenation with 100% oxygen Induction Type: IV induction Laryngoscope Size: Glidescope and 3 Grade View: Grade I Tube type: Oral Tube size: 7.0 mm Number of attempts: 1 Airway Equipment and Method: Stylet and Video-laryngoscopy Placement Confirmation: ETT inserted through vocal cords under direct vision, positive ETCO2 and breath sounds checked- equal and bilateral Secured at: 20 cm Tube secured with: Tape Dental Injury: Teeth and Oropharynx as per pre-operative assessment  Comments: Glidescope used for RSI. Intubation without difficulty by [facility]. Atrauamtic, cricoid pressure applied, +etCO2, +bilateral breath sounds

## 2021-08-27 NOTE — Consult Note (Signed)
CARDIOLOGY CONSULT NOTE       Patient ID: Lauren Schmidt MRN: [id number] DOB/AGE: 1960/11/17 61 y.o.  Admit date: 08/21/2021 Referring Physician: Tyrell Antonio Primary Physician: No primary care provider on file. Primary Cardiologist: Johney Frame Reason for Consultation: Preoperative clearance  Principal Problem:   Chest pain Active Problems:   Morbid obesity with BMI of 50.0-59.9, adult (HCC)   HLD (hyperlipidemia)   HTN (hypertension)   DM2 (diabetes mellitus, type 2) (HCC)   Abdominal pain, chronic, epigastric   Depressive disorder   Diabetic peripheral neuropathy associated with type 2 diabetes mellitus (HCC)   Dysphagia   Gastroesophageal reflux disease   Obstructive sleep apnea (adult) (pediatric)   Sleep disturbance   Type 2 diabetes mellitus with mild nonproliferative diabetic retinopathy without macular edema, bilateral (HCC)   Gastritis   HPI:  61 y.o. admitted with abdominal pain history of bowel obstruction pending surgery with Dr Johney Maine for GB. Associated SSCP r/o no acute ECG changes Cardiac w/u included  TTE 08/21/21 with normal EF 60-65% no RWMA no valve dx or evidence of pulmonary HTN.  Cardiac CTA 08/22/21 no obstructive dx with FFR negative in all proximal / mid vessels only positive distal LAD 0.56.  She continues to have RUQ pain She had a normal hepatobiliary scan showing patency of cystic and CBD [place] with sludge no GB distension/wall thickening Lipase and LFTls normal   ROS All other systems reviewed and negative except as noted above  Past Medical History:  Diagnosis Date   Anemia    Anxiety    Bowel obstruction ([place]) 05/14/2016   Depression    Diabetes mellitus without complication ([place])    type 2   Family history of adverse reaction to anesthesia    sister's bp dropped low during surgery   Fatty liver    GERD (gastroesophageal reflux disease)    History of hiatal hernia    Hypercholesterolemia    Hypertension    Peripheral neuropathy    feet   Sleep  apnea    uses cpap   Trigger thumb of both thumbs    Umbilical hernia with gangrene and obstruction 01/31/2013   Repaired on 01/31/13. SB resection Schmidt     Family History  Problem Relation Age of Onset   Heart failure Mother    Aneurysm Mother    Diabetes Mother     Social History   Socioeconomic History   Marital status: Single    Spouse name: Not on file   Number of children: Not on file   Years of education: Not on file   Highest education level: Not on file  Occupational History   Not on file  Tobacco Use   Smoking status: Former   Smokeless tobacco: Never   Tobacco comments:    Quit smoking 30 years ago  Vaping Use   Vaping Use: Never used  Substance and Sexual Activity   Alcohol use: No   Drug use: No   Sexual activity: Yes  Other Topics Concern   Not on file  Social History Narrative   Not on file   Social Determinants of Health   Financial Resource Strain: Not on file  Food Insecurity: Not on file  Transportation Needs: Not on file  Physical Activity: Not on file  Stress: Not on file  Social Connections: Not on file  Intimate Partner Violence: Not on file    Past Surgical History:  Procedure Laterality Date   BOWEL RESECTION N/A 01/31/2013   Procedure: SMALL BOWEL RESECTION;  Surgeon: Robyne Askew, MD;  Location: [facility] OR;  Service: General;  Laterality: N/A;   COLON SURGERY     COLONOSCOPY     HERNIA REPAIR     INSERTION OF MESH N/A 07/01/2016   Procedure: INSERTION OF MESH;  Surgeon: Chevis Pretty III, MD;  Location: [facility] OR;  Service: General;  Laterality: N/A;   KNEE ARTHROSCOPY Right 1990s?   LAPAROSCOPIC ASSISTED VENTRAL HERNIA REPAIR  07/01/2016   Right Knee arthroscopy     TRIGGER FINGER RELEASE Right 10/24/2016   Procedure: RELEASE TRIGGER FINGER/A-1 PULLEY RIGHT THUMB TRIGGER RELEASE;  Surgeon: Betha Loa, MD;  Location: [facility];  Service: Orthopedics;  Laterality: Right;   UMBILICAL HERNIA REPAIR N/A 01/31/2013    Procedure: HERNIA REPAIR UMBILICAL ADULT;  Surgeon: Robyne Askew, MD;  Location: [facility] OR;  Service: General;  Laterality: N/A;   VENTRAL HERNIA REPAIR N/A 07/01/2016   Procedure: LAPROSCOPIC ASSISTED VENTRAL HERNIA REPAIR;  Surgeon: Chevis Pretty III, MD;  Location: [facility] OR;  Service: General;  Laterality: N/A;      Current Facility-Administered Medications:    acetaminophen (TYLENOL) tablet 650 mg, 650 mg, Oral, Q4H PRN, Willis Modena, MD, 650 mg at 08/26/21 7588   ALPRAZolam Prudy Feeler) tablet 0.5 mg, 0.5 mg, Oral, QHS PRN, Willis Modena, MD, 0.5 mg at 08/21/21 2228   aspirin EC tablet 81 mg, 81 mg, Oral, Daily, Meriam Sprague, MD, 81 mg at 08/26/21 1050   dapagliflozin propanediol (FARXIGA) tablet 5 mg, 5 mg, Oral, QHS, Willis Modena, MD, 5 mg at 08/26/21 2143   enoxaparin (LOVENOX) injection 40 mg, 40 mg, Subcutaneous, Q24H, Willis Modena, MD, 40 mg at 08/26/21 1050   fenofibrate tablet 160 mg, 160 mg, Oral, Daily, Willis Modena, MD, 160 mg at 08/26/21 1049   HYDROcodone-acetaminophen (NORCO/VICODIN) 5-325 MG per tablet 1 tablet, 1 tablet, Oral, Q6H PRN, Regalado, Belkys A, MD, 1 tablet at 08/26/21 1851   insulin aspart (novoLOG) injection 0-15 Units, 0-15 Units, Subcutaneous, TID WC, Willis Modena, MD, 2 Units at 08/27/21 0818   insulin aspart (novoLOG) injection 0-5 Units, 0-5 Units, Subcutaneous, QHS, Willis Modena, MD   lactated ringers infusion, , Intravenous, Continuous, Willis Modena, MD, Last Rate: 10 mL/hr at 08/23/21 1152, Restarted at 08/23/21 1250   lisinopril (ZESTRIL) tablet 20 mg, 20 mg, Oral, Daily, Willis Modena, MD, 20 mg at 08/26/21 1049   metoprolol succinate (TOPROL-XL) 24 hr tablet 50 mg, 50 mg, Oral, Daily, Willis Modena, MD, 50 mg at 08/26/21 1049   morphine (PF) 2 MG/ML injection 2-4 mg, 2-4 mg, Intravenous, Q3H PRN, Willis Modena, MD, 2 mg at 08/25/21 1926   ondansetron (ZOFRAN) injection 4 mg, 4 mg, Intravenous, Q6H PRN, Willis Modena, MD, 4 mg  at 08/22/21 0912   pantoprazole (PROTONIX) EC tablet 40 mg, 40 mg, Oral, BID, Willis Modena, MD, 40 mg at 08/26/21 2143   prochlorperazine (COMPAZINE) injection 10 mg, 10 mg, Intravenous, Q6H PRN, Willis Modena, MD   protein supplement (ENSURE MAX) liquid, 11 oz, Oral, Daily, Regalado, Belkys A, MD   rosuvastatin (CRESTOR) tablet 20 mg, 20 mg, Oral, Daily, Pemberton, Heather E, MD, 20 mg at 08/26/21 1049   sertraline (ZOLOFT) tablet 200 mg, 200 mg, Oral, Daily, Willis Modena, MD, 200 mg at 08/26/21 1049   sucralfate (CARAFATE) 1 GM/10ML suspension 1 g, 1 g, Oral, TID WC & HS, Willis Modena, MD, 1 g at 08/26/21 2143  aspirin EC  81 mg Oral Daily   dapagliflozin propanediol  5 mg Oral QHS   enoxaparin (LOVENOX) injection  40 mg Subcutaneous Q24H   fenofibrate  160 mg Oral Daily   insulin aspart  0-15 Units Subcutaneous TID WC   insulin aspart  0-5 Units Subcutaneous QHS   lisinopril  20 mg Oral Daily   metoprolol succinate  50 mg Oral Daily   pantoprazole  40 mg Oral BID   Ensure Max Protein  11 oz Oral Daily   rosuvastatin  20 mg Oral Daily   sertraline  200 mg Oral Daily   sucralfate  1 g Oral TID WC & HS    lactated ringers 10 mL/hr at 08/23/21 1152    Physical Exam: Blood pressure 124/70, pulse 77, temperature 98.3 F (36.8 C), temperature source Oral, resp. rate 16, height 5' (1.524 m), weight 117.9 kg, SpO2 95 %.    Affect appropriate Obese female  HEENT: normal Neck supple with no adenopathy JVP normal no bruits no thyromegaly Lungs clear with no wheezing and good diaphragmatic motion Heart:  S1/S2 no murmur, no rub, gallop or click PMI normal Abdomen: benighn, BS positve, no tenderness, no AAA no bruit.  No HSM or HJR Distal pulses intact with no bruits No edema Neuro non-focal Skin warm and dry No muscular weakness   Labs:   Lab Results  Component Value Date   WBC 4.3 08/27/2021   HGB 11.1 (L) 08/27/2021   HCT 35.9 (L) 08/27/2021   MCV 85.3  08/27/2021   PLT 296 08/27/2021    Recent Labs  Lab 08/24/21 0946 08/25/21 0406 08/27/21 0548  NA  --    < > 141  K  --    < > 4.0  CL  --    < > 107  CO2  --    < > 26  BUN  --    < > 14  CREATININE  --    < > 0.79  CALCIUM  --    < > 9.1  PROT 8.1  --   --   BILITOT 0.8  --   --   ALKPHOS 36*  --   --   ALT 35  --   --   AST 32  --   --   GLUCOSE  --    < > 154*   < > = values in this interval not displayed.   Lab Results  Component Value Date   CKTOTAL 52 08/21/2021    Lab Results  Component Value Date   CHOL 147 08/22/2021   Lab Results  Component Value Date   HDL 14 (L) 08/22/2021   Lab Results  Component Value Date   LDLCALC 93 08/22/2021   Lab Results  Component Value Date   TRIG 199 (H) 08/22/2021   Lab Results  Component Value Date   CHOLHDL 10.5 08/22/2021   No results found for: LDLDIRECT    Radiology: DG Chest 2 View  Result Date: 08/21/2021 CLINICAL DATA:  61 year old female with history of chest pain and upper abdominal pain intermittently for the past 3 weeks. EXAM: CHEST - 2 VIEW COMPARISON:  Chest x-ray 08/27/2011. FINDINGS: Lung volumes are low. No consolidative airspace disease. No pleural effusions. No pneumothorax. No pulmonary nodule or mass noted. Pulmonary vasculature and the cardiomediastinal silhouette are within normal limits. IMPRESSION: 1. Low lung volumes without radiographic evidence of acute cardiopulmonary disease. Electronically Signed   By: Vinnie Langton M.D.   On: 08/21/2021 05:52   NM Hepatobiliary Liver Func  Result  Date: 08/24/2021 CLINICAL DATA:  Abdominal pain for 3 weeks. Borderline biliary ductal dilatation on recent ultrasound. EXAM: NUCLEAR MEDICINE HEPATOBILIARY IMAGING TECHNIQUE: Sequential images of the abdomen were obtained out to 60 minutes following intravenous administration of radiopharmaceutical. Delayed image was also obtained at 150 minutes. RADIOPHARMACEUTICALS:  5.5 mCi Tc-46m  Choletec IV COMPARISON:   None Available. FINDINGS: Prompt uptake and biliary excretion of activity by the liver is seen. Gallbladder activity is visualized, consistent with patency of cystic duct. Biliary activity passes into small bowel, consistent with patent common bile duct. IMPRESSION: Normal hepatobiliary scan, demonstrating patency of both the cystic and common bile ducts. Electronically Signed   By: Marlaine Hind M.D.   On: 08/24/2021 14:57   CT ABDOMEN PELVIS W CONTRAST  Result Date: 08/21/2021 CLINICAL DATA:  61 year old female with history of chest and upper abdominal pain for the past 3 weeks, worsening this morning. EXAM: CT ABDOMEN AND PELVIS WITH CONTRAST TECHNIQUE: Multidetector CT imaging of the abdomen and pelvis was performed using the standard protocol following bolus administration of intravenous contrast. RADIATION DOSE REDUCTION: This exam was performed according to the departmental dose-optimization program which includes automated exposure control, adjustment of the mA and/or kV according to patient size and/or use of iterative reconstruction technique. CONTRAST:  140mL OMNIPAQUE IOHEXOL 300 MG/ML  SOLN COMPARISON:  CT of the abdomen and pelvis 08/09/2021. FINDINGS: Lower chest: Unremarkable. Hepatobiliary: Diffuse low attenuation throughout the hepatic parenchyma, indicative of a background of severe hepatic steatosis. No suspicious cystic or solid hepatic lesions. No intra or extrahepatic biliary ductal dilatation. Gallbladder is normal in appearance. Pancreas: No pancreatic mass. No pancreatic ductal dilatation. No pancreatic or peripancreatic fluid collections or inflammatory changes. Spleen: Unremarkable. Adrenals/Urinary Tract: In the lower pole of the right kidney there are 2 fat containing lesions, indicative of angiomyolipomas. The largest of these measures 2.3 x 1.4 cm (axial image 48 of series 2). Left kidney and bilateral adrenal glands are normal in appearance. No hydroureteronephrosis. Small amount  of gas non dependently in the lumen of the urinary bladder. Urinary bladder is otherwise normal in appearance. Stomach/Bowel: The appearance of the stomach is normal. There is no pathologic dilatation of small bowel or colon. The appendix is not confidently identified and may be surgically absent. Regardless, there are no inflammatory changes noted adjacent to the cecum to suggest the presence of an acute appendicitis at this time. Vascular/Lymphatic: Minimal aortic atherosclerosis, without evidence of aneurysm or dissection in the abdominal or pelvic vasculature. No lymphadenopathy noted in the abdomen or pelvis. Reproductive: Small coarse calcifications in the fundus of the uterus, likely tiny calcified fibroids. Ovaries are unremarkable in appearance. Other: No significant volume of ascites.  No pneumoperitoneum. Musculoskeletal: There are no aggressive appearing lytic or blastic lesions noted in the visualized portions of the skeleton. IMPRESSION: 1. Gas is present in the lumen of the urinary bladder. This could be iatrogenic in the setting of recent catheterization for urinalysis. If there has been no recent catheterization, however, correlation with urinalysis would be recommended, as the possibility of urinary tract infection with gas-forming organisms is not excluded. 2. No other potential acute findings noted elsewhere in the abdomen or pelvis to account for the patient's symptoms. 3. Severe hepatic steatosis. 4. 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 5. Mild aortic atherosclerosis. Electronically Signed   By: Vinnie Langton M.D.   On: 08/21/2021 06:45   CT ABDOMEN PELVIS W CONTRAST  Result Date: 08/09/2021 CLINICAL DATA:  61 year old female with history of generalized abdominal pain and no recent bowel movement. Suspected bowel obstruction. EXAM: CT ABDOMEN AND PELVIS WITH CONTRAST TECHNIQUE: Multidetector CT imaging of the abdomen and  pelvis was performed using the standard protocol following bolus administration of intravenous contrast. RADIATION DOSE REDUCTION: This exam was performed according to the departmental dose-optimization program which includes automated exposure control, adjustment of the mA and/or kV according to patient size and/or use of iterative reconstruction technique. CONTRAST:  172mL OMNIPAQUE IOHEXOL 300 MG/ML  SOLN COMPARISON:  CT of the abdomen and pelvis 08/23/2016. FINDINGS: Lower chest: Unremarkable. Hepatobiliary: Diffuse low attenuation throughout the hepatic parenchyma, indicative of a background of severe hepatic steatosis. No discrete cystic or solid hepatic lesions. No intra or extrahepatic biliary ductal dilatation. Gallbladder is normal in appearance. Pancreas: No pancreatic mass. No pancreatic ductal dilatation. No pancreatic or peripancreatic fluid collections or inflammatory changes. Spleen: Unremarkable. Adrenals/Urinary Tract: In the lower pole of the right kidney there are 2 small lesions both of which contain fatty attenuation, indicative angiomyolipomas, largest of which measures 1.7 cm in diameter (coronal image 77 of series 5). Left kidney and bilateral adrenal glands are normal in appearance. No hydroureteronephrosis. Urinary bladder is normal in appearance. Stomach/Bowel: The appearance of the stomach is normal. There is no pathologic dilatation of small bowel or colon. Normal appendix. Vascular/Lymphatic: Aortic atherosclerosis, without evidence of aneurysm or dissection in the abdominal or pelvic vasculature. No lymphadenopathy noted in the abdomen or pelvis. Reproductive: Uterus and ovaries are unremarkable in appearance. Other: No significant volume of ascites noted in the visualized portions of the peritoneal cavity. Musculoskeletal: There are no aggressive appearing lytic or blastic lesions noted in the visualized portions of the skeleton. IMPRESSION: 1. No acute findings are noted in the  abdomen or pelvis to account for the patient's symptoms. 2. Hepatic steatosis. 3. Two fat containing lesions in the lower pole of the right kidney, compatible with angiomyolipomas, largest of which measures 1.7 cm in diameter. Electronically Signed   By: Vinnie Langton M.D.   On: 08/09/2021 07:28   CT CORONARY MORPH W/CTA COR W/SCORE W/CA W/CM &/OR WO/CM  Addendum Date: 08/22/2021   ADDENDUM REPORT: 08/22/2021 14:32 HISTORY: Chest pain/anginal equiv, intermediate CAD risk, not treadmill candidate EXAM: Cardiac/Coronary  CT TECHNIQUE: The patient was scanned on a [employer]. PROTOCOL: A 120 kV prospective scan was triggered in the descending thoracic aorta at 111 HU's. Axial non-contrast 3 mm slices were carried out through the heart. The data set was analyzed on a dedicated work station and scored using the Agatston method. Gantry rotation speed was 250 msecs and collimation was .6 mm. Beta blockade and 0.8 mg of sl NTG was given. The 3D data set was reconstructed in 5% intervals of the 35-75 % of the R-R cycle. Systolic and diastolic phases were analyzed on a dedicated work station using MPR, MIP and VRT modes. The patient received 174mL OMNIPAQUE IOHEXOL 350 MG/ML SOLN contrast. FINDINGS: Image quality: Poor Noise artifact is: Moderate, signal to noise ratio is reduced. Coronary calcium score is 107, which places the patient in the 87th percentile for age and sex matched control. Coronary arteries: Normal coronary origins.  Right dominance. Right Coronary Artery: Mild mixed atherosclerotic plaque in the proximal RCA, 25-49% stenosis. Moderate atherosclerotic plaque in the mid and distal RCA, 50-69% stenosis. Left Main Coronary Artery: No detectable plaque or stenosis. Left Anterior Descending Coronary Artery: Minimal scattered mixed atherosclerotic plaque in the  LAD, <25% stenosis. Moderate atherosclerotic plaque in the distal LAD, 50-69% stenosis. Image quality is suboptimal to definitely assess  lumen, probable moderate stenosis in the proximal first diagonal artery, 50-69% stenosis. Left Circumflex Artery: Small first OM with calcified plaque, may have stenosis but unable to determine definitively. No other detectable plaque or stenosis in LCx. Aorta: Normal size, 30 mm at the mid ascending aorta (level of the PA bifurcation) measured double oblique. No calcifications. No dissection. Aortic Valve: No calcifications. Other findings: Normal pulmonary vein drainage into the left atrium. Normal left atrial appendage without thrombus. Normal size of the pulmonary artery. IMPRESSION: 1. 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 CT FFR will be performed and reported separately. 2. Coronary calcium score is 107, which places the patient in the [place] percentile for age and sex matched control. 3. Normal coronary origins with right dominance. Electronically Signed   By: Cherlynn Kaiser M.D.   On: 08/22/2021 14:32   Result Date: 08/22/2021 EXAM: OVER-READ INTERPRETATION  CT CHEST The following report is a limited chest CT over-read performed by radiologist Dr. Abigail Miyamoto of [facility], [place] on 08/22/2021. This over-read does not include interpretation of cardiac or coronary anatomy or pathology. The coronary CTA interpretation by the cardiologist is attached. COMPARISON:  CTA chest of 1 day prior FINDINGS: Vascular: Aortic atherosclerosis. No central pulmonary embolism, on this non-dedicated study. Mediastinum/Nodes: No imaged thoracic adenopathy. Lungs/Pleura: No pleural fluid. Tiny pulmonary nodules were detailed on yesterday's dedicated diagnostic chest CT. No superimposed acute lobar consolidation. Upper Abdomen: Hepatic steatosis. Normal imaged portions of the spleen, stomach. Musculoskeletal: No acute osseous abnormality. Lower thoracic spondylosis. IMPRESSION: No acute findings in the imaged extracardiac chest. Aortic Atherosclerosis  (ICD10-I70.0). Hepatic steatosis. Electronically Signed: By: Abigail Miyamoto M.D. On: 08/22/2021 11:53   DG UGI W DOUBLE CM (HD BA)  Result Date: 08/17/2021 CLINICAL DATA:  A 61 year old female presents with chest discomfort while swallowing. EXAM: UPPER GI SERIES WITH KUB TECHNIQUE: After obtaining a scout radiograph a routine upper GI series was performed using thin and high density barium. FLUOROSCOPY TIME:  Fluoroscopy Time:  2 minutes 48 seconds Radiation Exposure Index (if provided by the fluoroscopic device): 72 mGy Number of Acquired Spot Images: 1 COMPARISON:  CT of the abdomen and pelvis from Aug 09, 2021. FINDINGS: Scout film of the abdomen without signs of obstruction. Thin barium was administered for assessment of swallow function, gross swallow function without signs of gross abnormality related to swallowing. Effervescent crystals and thick barium was then administered showing normal distensibility of the esophagus. No visible lesion or signs of esophageal stricture. Study mildly compromised by patient body habitus. Contrast flowed into the stomach. After coating the stomach images were obtained which shows no gross fold thickening or gastric lesion. Contrast flowed readily into the duodenum with normal appearance of duodenal bulb and normal duodenal jejunal anatomy. Assessment of esophageal motility shows normal primary wave with complete stripping of the esophagus due to primary peristalsis. Full column assessment with normal caliber of the esophagus, small hiatal hernia. Barium tablet administered and passed without difficulty into the stomach through the esophagus. No gastroesophageal reflux was elicited with positioning or water siphon. IMPRESSION: Small hiatal hernia otherwise normal upper GI evaluation. Patient did experience discomfort with swallowing and related a globus sensation with the pill which passed readily from the esophagus into the stomach. Electronically Signed   By: Jewel Baize.D.  On: 08/17/2021 11:17   CT CORONARY FRACTIONAL FLOW RESERVE DATA PREP  Result Date: 08/22/2021 EXAM: CT FFR ANALYSIS CLINICAL DATA:  abnormal coronary CT FINDINGS: FFRct analysis was performed on the original cardiac CT angiogram dataset. Diagrammatic representation of the FFRct analysis is provided in a separate PDF document in PACS. This dictation was created using the PDF document and an interactive 3D model of the results. 3D model is not available in the EMR/PACS. Normal FFR range is >0.80. Indeterminate (grey) zone is 0.76-0.80. FFR delta of 0.13 is considered significant. 1. Left Main: FFR = 0.98 2. LAD: Proximal FFR = 0.94, mid FFR = 0.90, Distal FFR = 0.56, first diagonal FFR = 0.81 3. LCX: Proximal FFR = 0.96, distal FFR = 0.93, OM1 FFR = 0.80 4. RCA: Proximal FFR = 0.96, mid FFR =0.93, Distal FFR = 0.81 IMPRESSION: 1. 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 The vessel is small caliber <92mm in this location. No other hemodynamically significant stenoses. RECOMMENDATIONS: Guideline-directed medical therapy and aggressive risk factor modification for secondary prevention of coronary artery disease. Electronically Signed   By: Cherlynn Kaiser M.D.   On: 08/22/2021 14:41   CT ANGIO CHEST AORTA W/CM &/OR WO/CM  Result Date: 08/21/2021 CLINICAL DATA:  61 year old female with chest and upper abdominal pain for the past 3 weeks, worsening this morning. Suspected acute aortic syndrome. EXAM: CT ANGIOGRAPHY CHEST WITH CONTRAST TECHNIQUE: Multidetector CT imaging of the chest was performed using the standard protocol during bolus administration of intravenous contrast. Multiplanar CT image reconstructions and MIPs were obtained to evaluate the vascular anatomy. RADIATION DOSE REDUCTION: This exam was performed according to the departmental dose-optimization program which includes automated exposure control, adjustment of the mA and/or kV according to patient size  and/or use of iterative reconstruction technique. CONTRAST:  80mL OMNIPAQUE IOHEXOL 350 MG/ML SOLN COMPARISON:  No priors. FINDINGS: Cardiovascular: Precontrast images demonstrate no crescentic high attenuation associated with the wall of the thoracic aorta to suggest the presence of acute intramural hemorrhage. Mild atherosclerosis is noted in the aortic arch. No aneurysm or dissection of the thoracic aorta or the great vessels of the mediastinum. Ascending thoracic aorta, mid arch and descending thoracic aorta measure 2.7 cm, 2.4 cm and 2.5 cm in diameter respectively. No definite coronary artery calcifications. Heart size is normal. There is no significant pericardial fluid, thickening or pericardial calcification. Mediastinum/Nodes: No pathologically enlarged mediastinal or hilar lymph nodes. Esophagus is unremarkable in appearance. No axillary lymphadenopathy. Lungs/Pleura: No pneumothorax. No acute consolidative airspace disease. No pleural effusions. A few scattered tiny 2-3 mm pulmonary nodules are noted in the periphery of the lungs, nonspecific, but statistically likely benign areas of mucoid impaction within terminal bronchioles. No larger more suspicious appearing pulmonary nodules or masses are noted. Upper Abdomen: Diffuse low attenuation throughout the visualized hepatic parenchyma, indicative of severe hepatic steatosis. Musculoskeletal: There are no aggressive appearing lytic or blastic lesions noted in the visualized portions of the skeleton. Review of the MIP images confirms the above findings. IMPRESSION: 1. No acute findings are noted in the thorax to account for the patient's symptoms. Specifically, no evidence of acute aortic syndrome. 2. Tiny 2-3 mm pulmonary nodules in the lungs bilaterally, nonspecific, but statistically likely benign. No follow-up needed if patient is low-risk (and has no known or suspected primary neoplasm). Non-contrast chest CT can be considered in 12 months if patient  is high-risk. This recommendation follows the consensus statement: Guidelines for Management of Incidental Pulmonary  Nodules Detected on CT Images: From the Fleischner Society 2017; Radiology 2017; 284:228-243. 3. Aortic atherosclerosis (mild). 4. Severe hepatic steatosis. Aortic Atherosclerosis (ICD10-I70.0). Electronically Signed   By: Vinnie Langton M.D.   On: 08/21/2021 07:28   ECHOCARDIOGRAM COMPLETE  Result Date: 08/21/2021    ECHOCARDIOGRAM REPORT   Patient Name:   Lauren Schmidt Date of Exam: 08/21/2021 Medical Rec #:  [id number]    Height:       60.0 in Accession #:    [id number]   Weight:       260.0 lb Date of Birth:  25-May-1960    BSA:          2.087 m Patient Age:    50 years     BP:           153/63 mmHg Patient Gender: F            HR:           70 bpm. Exam Location:  Inpatient Procedure: 2D Echo, Color Doppler, Cardiac Doppler and Intracardiac            Opacification Agent Indications:    Chest pain  History:        Patient has no prior history of Echocardiogram examinations.                 Signs/Symptoms:Chest Pain; Risk Factors:Hypertension, Diabetes                 and HLD.  Sonographer:    Joette Catching RCS Referring Phys: [id number] Jonnie Finner  Sonographer Comments: Image acquisition challenging due to patient body habitus. IMPRESSIONS  1. Left ventricular ejection fraction, by estimation, is 60 to 65%. The left ventricle has normal function. The left ventricle has no regional wall motion abnormalities. Left ventricular diastolic parameters are indeterminate.  2. Right ventricular systolic function is normal. The right ventricular size is normal.  3. Left atrial size was mildly dilated.  4. The mitral valve is normal in structure. No evidence of mitral valve regurgitation. No evidence of mitral stenosis.  5. The aortic valve is normal in structure. Aortic valve regurgitation is not visualized. Aortic valve sclerosis is present, with no evidence of aortic valve stenosis.  6. The inferior  vena cava is normal in size with greater than 50% respiratory variability, suggesting right atrial pressure of 3 mmHg. FINDINGS  Left Ventricle: Left ventricular ejection fraction, by estimation, is 60 to 65%. The left ventricle has normal function. The left ventricle has no regional wall motion abnormalities. Definity contrast agent was given IV to delineate the left ventricular  endocardial borders. The left ventricular internal cavity size was normal in size. There is no left ventricular hypertrophy. Left ventricular diastolic parameters are indeterminate. Right Ventricle: The right ventricular size is normal. No increase in right ventricular wall thickness. Right ventricular systolic function is normal. Left Atrium: Left atrial size was mildly dilated. Right Atrium: Right atrial size was normal in size. Pericardium: There is no evidence of pericardial effusion. Mitral Valve: The mitral valve is normal in structure. No evidence of mitral valve regurgitation. No evidence of mitral valve stenosis. Tricuspid Valve: The tricuspid valve is normal in structure. Tricuspid valve regurgitation is not demonstrated. No evidence of tricuspid stenosis. Aortic Valve: The aortic valve is normal in structure. Aortic valve regurgitation is not visualized. Aortic valve sclerosis is present, with no evidence of aortic valve stenosis. Aortic valve mean gradient measures 5.0 mmHg. Aortic valve peak gradient measures 8.9  mmHg. Aortic valve area, by VTI measures 2.22 cm. Pulmonic Valve: The pulmonic valve was normal in structure. Pulmonic valve regurgitation is not visualized. No evidence of pulmonic stenosis. Aorta: The aortic root is normal in size and structure. Venous: The inferior vena cava is normal in size with greater than 50% respiratory variability, suggesting right atrial pressure of 3 mmHg. IAS/Shunts: No atrial level shunt detected by color flow Doppler.  LEFT VENTRICLE PLAX 2D LVIDd:         4.10 cm      Diastology  LVIDs:         3.00 cm      LV e' medial:    6.10 cm/s LV PW:         0.80 cm      LV E/e' medial:  15.5 LV IVS:        0.90 cm      LV e' lateral:   7.62 cm/s LVOT diam:     1.90 cm      LV E/e' lateral: 12.4 LV SV:         68 LV SV Index:   33 LVOT Area:     2.84 cm  LV Volumes (MOD) LV vol d, MOD A2C: 129.0 ml LV vol d, MOD A4C: 97.0 ml LV vol s, MOD A2C: 59.3 ml LV vol s, MOD A4C: 40.6 ml LV SV MOD A2C:     69.7 ml LV SV MOD A4C:     97.0 ml LV SV MOD BP:      60.5 ml RIGHT VENTRICLE RV Basal diam:  3.40 cm RV S prime:     13.90 cm/s TAPSE (M-mode): 2.3 cm LEFT ATRIUM             Index        RIGHT ATRIUM           Index LA diam:        3.90 cm 1.87 cm/m   RA Area:     11.50 cm LA Vol (A2C):   55.6 ml 26.64 ml/m  RA Volume:   21.30 ml  10.21 ml/m LA Vol (A4C):   74.2 ml 35.56 ml/m LA Biplane Vol: 68.5 ml 32.83 ml/m  AORTIC VALVE                     PULMONIC VALVE AV Area (Vmax):    2.15 cm      PV Vmax:       0.91 m/s AV Area (Vmean):   2.16 cm      PV Peak grad:  3.3 mmHg AV Area (VTI):     2.22 cm AV Vmax:           149.00 cm/s AV Vmean:          107.000 cm/s AV VTI:            0.308 m AV Peak Grad:      8.9 mmHg AV Mean Grad:      5.0 mmHg LVOT Vmax:         113.00 cm/s LVOT Vmean:        81.600 cm/s LVOT VTI:          0.241 m LVOT/AV VTI ratio: 0.78  AORTA Ao Root diam: 3.30 cm Ao Asc diam:  3.00 cm MITRAL VALVE MV Area (PHT): 4.89 cm    SHUNTS MV Decel Time: 155 msec    Systemic VTI:  0.24 m MV E velocity: 94.70  cm/s  Systemic Diam: 1.90 cm MV A velocity: 95.50 cm/s MV E/A ratio:  0.99 MV A Prime:    10.4 cm/s Dani Gobble Croitoru MD Electronically signed by Sanda Klein MD Signature Date/Time: 08/21/2021/2:41:55 PM    Final    US Abdomen Limited RUQ (LIVER/GB)  Result Date: 08/24/2021 CLINICAL DATA:  Abdominal pain. EXAM: ULTRASOUND ABDOMEN LIMITED RIGHT UPPER QUADRANT COMPARISON:  CT imaging from Aug 21, 2021. FINDINGS: Gallbladder: Small amount of sludge in the dependent gallbladder. No  gallbladder distension or wall thickening. No pericholecystic fluid. Common bile duct: Diameter: 6 mm Liver: Moderate hepatic steatosis. Fatty sparing near the gallbladder fossa. Portal vein is patent on color Doppler imaging with normal direction of blood flow towards the liver. 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 IMPRESSION: Top-normal caliber of the common bile duct. No signs of cholelithiasis or acute biliary process otherwise. Correlate with laboratory values and clinical presentation to determine whether MRI/MRCP may be warranted. Moderate hepatic steatosis. RIGHT renal angiomyolipomas. Electronically Signed   By: Zetta Bills M.D.   On: 08/24/2021 08:47    EKG: SR no acute changes    ASSESSMENT AND PLAN:   Preoperative:  atypical chest pian CTA low risk with non obstructive dx FFR only positive in most distal LAD. TTE with normal EF and no valve dx ok to proceed with abdominal surgery ? GB although labs and xrays do not seem to support need DM:  Discussed low carb diet.  Target hemoglobin A1c is 6.5 or less.  Continue current medications. HLD:  continue statin HTN:  continue ACE  Signed: Jenkins Rouge 08/27/2021, 9:55 AM

## 2021-08-28 ENCOUNTER — Encounter (HOSPITAL_COMMUNITY): Payer: Self-pay | Admitting: Surgery

## 2021-08-28 DIAGNOSIS — I2510 Atherosclerotic heart disease of native coronary artery without angina pectoris: Secondary | ICD-10-CM

## 2021-08-28 DIAGNOSIS — R079 Chest pain, unspecified: Secondary | ICD-10-CM | POA: Diagnosis not present

## 2021-08-28 LAB — CREATININE, SERUM
Creatinine, Ser: 0.8 mg/dL (ref 0.44–1.00)
GFR, Estimated: >60 mL/min (ref >60)

## 2021-08-28 LAB — BASIC METABOLIC PANEL
Anion gap: 10 (ref 5–15)
BUN: 12 mg/dL (ref 8–23)
CO2: 25 mmol/L (ref 22–32)
Calcium: 9.4 mg/dL (ref 8.9–10.3)
Chloride: 103 mmol/L (ref 98–111)
Creatinine, Ser: 0.8 mg/dL (ref 0.44–1.00)
GFR, Estimated: >60 mL/min (ref >60)
Glucose, Bld: 165 mg/dL — ABNORMAL HIGH (ref 70–99)
Potassium: 4.4 mmol/L (ref 3.5–5.1)
Sodium: 138 mmol/L (ref 135–145)

## 2021-08-28 LAB — GLUCOSE, CAPILLARY: Glucose-Capillary: 127 mg/dL — ABNORMAL HIGH (ref 70–99)

## 2021-08-28 LAB — CBC
HCT: 39.5 % (ref 36.0–46.0)
Hemoglobin: 11.3 g/dL — ABNORMAL LOW (ref 12.0–15.0)
MCH: 25.5 pg — ABNORMAL LOW (ref 26.0–34.0)
MCHC: 28.6 g/dL — ABNORMAL LOW (ref 30.0–36.0)
MCV: 89.2 fL (ref 80.0–100.0)
Platelets: 370 10*3/uL (ref 150–400)
RBC: 4.43 MIL/uL (ref 3.87–5.11)
RDW: 15 % (ref 11.5–15.5)
WBC: 11 10*3/uL — ABNORMAL HIGH (ref 4.0–10.5)
nRBC: 0 % (ref 0.0–0.2)

## 2021-08-28 MED ORDER — PANTOPRAZOLE SODIUM 40 MG PO TBEC: 40.0000 mg | DELAYED_RELEASE_TABLET | Freq: Two times a day (BID) | ORAL | 1 refills | Status: DC

## 2021-08-28 MED ORDER — METHOCARBAMOL 500 MG PO TABS: 1000.0000 mg | ORAL_TABLET | Freq: Three times a day (TID) | ORAL | 0 refills | Status: DC | PRN

## 2021-08-28 MED ORDER — METHOCARBAMOL 500 MG PO TABS
1000.0000 mg | ORAL_TABLET | Freq: Three times a day (TID) | ORAL | Status: DC | PRN
  Administered 2021-08-28: 1000 mg via ORAL
  Filled 2021-08-28: qty 2

## 2021-08-28 MED ORDER — OXYCODONE HCL 5 MG PO TABS
5.0000 mg | ORAL_TABLET | ORAL | Status: DC | PRN
  Administered 2021-08-28: 5 mg via ORAL
  Filled 2021-08-28: qty 1

## 2021-08-28 MED ORDER — MORPHINE SULFATE (PF) 2 MG/ML IV SOLN: 1.0000 mg | INTRAVENOUS | Status: DC | PRN

## 2021-08-28 MED ORDER — SUCRALFATE 1 GM/10ML PO SUSP
1.0000 g | Freq: Three times a day (TID) | ORAL | 0 refills | Status: DC
Start: 2021-08-28 — End: 2021-12-12

## 2021-08-28 MED ORDER — ACETAMINOPHEN 500 MG PO TABS: 1000.0000 mg | ORAL_TABLET | Freq: Four times a day (QID) | ORAL | 0 refills | Status: AC | PRN

## 2021-08-28 MED ORDER — ROSUVASTATIN CALCIUM 20 MG PO TABS: 20.0000 mg | ORAL_TABLET | Freq: Every day | ORAL | 2 refills | Status: AC

## 2021-08-28 MED ORDER — ASPIRIN 81 MG PO TBEC: 81.0000 mg | DELAYED_RELEASE_TABLET | Freq: Every day | ORAL | 12 refills | Status: AC

## 2021-08-28 MED ORDER — OXYCODONE HCL 5 MG PO TABS: 5.0000 mg | ORAL_TABLET | ORAL | 0 refills | Status: DC | PRN

## 2021-08-28 NOTE — Progress Notes (Signed)
Cardiologist:  Shari Prows  Subjective:  Abdominal pain post op but improved   Objective:  Vitals:   08/27/21 2121 08/28/21 0504 08/28/21 0818 08/28/21 0821  BP: (!) 171/78 (!) 152/67 138/75   Pulse: 81 79  78  Resp: 20 18    Temp: 98.3 F (36.8 C) 98.2 F (36.8 C)    TempSrc: Oral Oral    SpO2: 95% 93%    Weight:      Height:        Intake/Output from previous day:  Intake/Output Summary (Last 24 hours) at 08/28/2021 0943 Last data filed at 08/27/2021 1727 Gross per 24 hour  Intake 1350 ml  Output 25 ml  Net 1325 ml    Physical Exam: Obese female Post lap choly No murmur Lungs clear Trace edema   Lab Results: Basic Metabolic Panel: Recent Labs    08/27/21 0548 08/28/21 0334 08/28/21 0831  NA 141  --  138  K 4.0  --  4.4  CL 107  --  103  CO2 26  --  25  GLUCOSE 154*  --  165*  BUN 14  --  12  CREATININE 0.79 0.80 0.80  CALCIUM 9.1  --  9.4   Liver Function Tests: No results for input(s): AST, ALT, ALKPHOS, BILITOT, PROT, ALBUMIN in the last 72 hours. No results for input(s): LIPASE, AMYLASE in the last 72 hours. CBC: Recent Labs    08/27/21 0548 08/28/21 0831  WBC 4.3 11.0*  HGB 11.1* 11.3*  HCT 35.9* 39.5  MCV 85.3 89.2  PLT 296 370    Imaging: DG Cholangiogram Operative  Result Date: 08/27/2021 CLINICAL DATA:  Intraoperative cholangiogram during laparoscopic cholecystectomy. EXAM: INTRAOPERATIVE CHOLANGIOGRAM FLUOROSCOPY TIME:  10 seconds (5.2 mGy) COMPARISON:  None Available. FINDINGS: Intraoperative cholangiographic images of the right upper abdominal quadrant during laparoscopic cholecystectomy are provided for review. Surgical clips overlie the expected location of the gallbladder fossa. Contrast injection demonstrates selective cannulation of the central aspect of the cystic duct. There is passage of contrast through the central aspect of the cystic duct with filling of a non dilated common bile duct. There is passage of contrast though  the CBD and into the descending portion of the duodenum. There is minimal reflux of injected contrast into the common hepatic duct and central aspect of the non dilated intrahepatic biliary system. There are no discrete filling defects within the opacified portions of the biliary system to suggest the presence of choledocholithiasis. IMPRESSION: No evidence of choledocholithiasis. Electronically Signed   By: Simonne Come M.D.   On: 08/27/2021 16:40   NM Hepato W/EF  Result Date: 08/27/2021 CLINICAL DATA:  Abdominal pain EXAM: NUCLEAR MEDICINE HEPATOBILIARY IMAGING WITH GALLBLADDER EF TECHNIQUE: Sequential images of the abdomen were obtained out to 60 minutes following intravenous administration of radiopharmaceutical. After oral ingestion of Ensure, gallbladder ejection fraction was determined. At 60 min, normal ejection fraction is greater than 33%. RADIOPHARMACEUTICALS:  5.47 mCi Tc-69m  Choletec IV COMPARISON:  None Available. FINDINGS: Liver is enlarged in size. Prompt uptake and biliary excretion of activity by the liver is seen. Gallbladder activity is visualized, consistent with patency of cystic duct. Biliary activity passes into small bowel, consistent with patent common bile duct. Calculated gallbladder ejection fraction is 26%. (Normal gallbladder ejection fraction with Ensure is greater than 33%.) IMPRESSION: There is no obstruction of cystic duct or common bile duct. Estimated gallbladder ejection fraction with fatty meal is 26% which is lower than normal range suggesting possible  gallbladder motility dysfunction. Electronically Signed   By: Ernie Avena M.D.   On: 08/27/2021 13:00    Cardiac Studies:  ECG:  Orders placed or performed during the hospital encounter of 08/21/21   ED EKG   ED EKG   EKG 12-Lead   EKG 12-Lead   EKG 12-Lead (recurrent chest pain)     Telemetry:  NSR 08/28/2021   Echo: EF 60-65% 08/21/21  Medications:    acetaminophen  1,000 mg Oral Q6H   aspirin EC   81 mg Oral Daily   dapagliflozin propanediol  5 mg Oral QHS   enoxaparin (LOVENOX) injection  40 mg Subcutaneous Q24H   fenofibrate  160 mg Oral Daily   insulin aspart  0-15 Units Subcutaneous TID WC   insulin aspart  0-5 Units Subcutaneous QHS   lip balm  1 application. Topical BID   lisinopril  20 mg Oral Daily   metoprolol succinate  50 mg Oral Daily   pantoprazole  40 mg Oral BID   polycarbophil  625 mg Oral BID   Ensure Max Protein  11 oz Oral Daily   rosuvastatin  20 mg Oral Daily   sertraline  200 mg Oral Daily   sodium chloride flush  3 mL Intravenous Q12H   sucralfate  1 g Oral TID WC & HS      sodium chloride     lactated ringers     lactated ringers 10 mL/hr at 08/27/21 1512    Assessment/Plan:   CAD:  moderate by cardiac CTA 08/22/21 no complications with lap choly. FFR only positive in most distal LAD. Continue beta blocker and statin  HTN:  continue ACE stable DM;  Discussed low carb diet.  Target hemoglobin A1c is 6.5 or less.  Continue current medications. HLD: continue statin  Abdominal Pain:  post lap choly Dr Michaell Cowing 08/27/21 routine post op course Hct 39.5 this am   Charlton Haws 08/28/2021, 9:43 AM

## 2021-08-28 NOTE — Progress Notes (Signed)
1 Day Post-Op  Subjective: Feeling much better today except typical post op soreness.  Pain from prior to surgery is gone.  No nausea and wanting to eat.  ROS: See above, otherwise other systems negative  Objective: Vital signs in last 24 hours: Temp:  [97.7 F (36.5 C)-98.4 F (36.9 C)] 98.2 F (36.8 C) (05/23 0504) Pulse Rate:  [65-81] 78 (05/23 0821) Resp:  [13-24] 18 (05/23 0504) BP: (111-171)/(55-95) 138/75 (05/23 0818) SpO2:  [93 %-100 %] 93 % (05/23 0504) Last BM Date : 08/26/21  Intake/Output from previous day: 05/22 0701 - 05/23 0700 In: 1350 [I.V.:1250; IV Piggyback:100] Out: 25 [Blood:25] Intake/Output this shift: No intake/output data recorded.  PE: Abd: soft, appropriately tender, obese, ND, incisions c/d/I with gauze and tegaderm present  Lab Results:  Recent Labs    08/27/21 0548  WBC 4.3  HGB 11.1*  HCT 35.9*  PLT 296   BMET Recent Labs    08/27/21 0548 08/28/21 0334  NA 141  --   K 4.0  --   CL 107  --   CO2 26  --   GLUCOSE 154*  --   BUN 14  --   CREATININE 0.79 0.80  CALCIUM 9.1  --    PT/INR No results for input(s): LABPROT, INR in the last 72 hours. CMP     Component Value Date/Time   NA 141 08/27/2021 0548   K 4.0 08/27/2021 0548   CL 107 08/27/2021 0548   CO2 26 08/27/2021 0548   GLUCOSE 154 (H) 08/27/2021 0548   BUN 14 08/27/2021 0548   CREATININE 0.80 08/28/2021 0334   CALCIUM 9.1 08/27/2021 0548   PROT 8.1 08/24/2021 0946   ALBUMIN 4.0 08/24/2021 0946   AST 32 08/24/2021 0946   ALT 35 08/24/2021 0946   ALKPHOS 36 (L) 08/24/2021 0946   BILITOT 0.8 08/24/2021 0946   GFRNONAA >60 08/28/2021 0334   GFRAA >60 10/18/2016 1454   Lipase     Component Value Date/Time   LIPASE 35 08/21/2021 0523       Studies/Results: DG Cholangiogram Operative  Result Date: 08/27/2021 CLINICAL DATA:  Intraoperative cholangiogram during laparoscopic cholecystectomy. EXAM: INTRAOPERATIVE CHOLANGIOGRAM FLUOROSCOPY TIME:  10  seconds (5.2 mGy) COMPARISON:  None Available. FINDINGS: Intraoperative cholangiographic images of the right upper abdominal quadrant during laparoscopic cholecystectomy are provided for review. Surgical clips overlie the expected location of the gallbladder fossa. Contrast injection demonstrates selective cannulation of the central aspect of the cystic duct. There is passage of contrast through the central aspect of the cystic duct with filling of a non dilated common bile duct. There is passage of contrast though the CBD and into the descending portion of the duodenum. There is minimal reflux of injected contrast into the common hepatic duct and central aspect of the non dilated intrahepatic biliary system. There are no discrete filling defects within the opacified portions of the biliary system to suggest the presence of choledocholithiasis. IMPRESSION: No evidence of choledocholithiasis. Electronically Signed   By: Simonne Come M.D.   On: 08/27/2021 16:40   NM Hepato W/EF  Result Date: 08/27/2021 CLINICAL DATA:  Abdominal pain EXAM: NUCLEAR MEDICINE HEPATOBILIARY IMAGING WITH GALLBLADDER EF TECHNIQUE: Sequential images of the abdomen were obtained out to 60 minutes following intravenous administration of radiopharmaceutical. After oral ingestion of Ensure, gallbladder ejection fraction was determined. At 60 min, normal ejection fraction is greater than 33%. RADIOPHARMACEUTICALS:  5.47 mCi Tc-1m  Choletec IV COMPARISON:  None Available. FINDINGS: Liver  is enlarged in size. Prompt uptake and biliary excretion of activity by the liver is seen. Gallbladder activity is visualized, consistent with patency of cystic duct. Biliary activity passes into small bowel, consistent with patent common bile duct. Calculated gallbladder ejection fraction is 26%. (Normal gallbladder ejection fraction with Ensure is greater than 33%.) IMPRESSION: There is no obstruction of cystic duct or common bile duct. Estimated gallbladder  ejection fraction with fatty meal is 26% which is lower than normal range suggesting possible gallbladder motility dysfunction. Electronically Signed   By: Ernie Avena M.D.   On: 08/27/2021 13:00    Anti-infectives: Anti-infectives (From admission, onward)    Start     Dose/Rate Route Frequency Ordered Stop   08/27/21 1445  cefTRIAXone (ROCEPHIN) 2 g in sodium chloride 0.9 % 100 mL IVPB        2 g 200 mL/hr over 30 Minutes Intravenous On call to O.R. 08/27/21 1408 08/27/21 1534   08/21/21 1900  cefTRIAXone (ROCEPHIN) 1 g in sodium chloride 0.9 % 100 mL IVPB  Status:  Discontinued        1 g 200 mL/hr over 30 Minutes Intravenous Every 24 hours 08/21/21 1847 08/24/21 1444        Assessment/Plan POD 1, s/p lap chole and LOA secondary to biliary dyskinesia and possible partial obstruction Dr. Michaell Cowing, 5/22 -reports she is feeling much better -soft diet -pain control -mobilize -if tolerates diet, she can DC home from our standpoint -Rx, follow up, and work note has been provided to the patient  FEN - carb mod VTE - Lovenox, ASA ID - none  HTN DM Morbid obesity HLD UTI  I reviewed hospitalist notes, last 24 h vitals and pain scores, last 48 h intake and output, last 24 h labs and trends, and last 24 h imaging results.   LOS: 4 days    Letha Cape , [facility] Surgery 08/28/2021, 8:37 AM Please see Amion for pager number during day hours 7:00am-4:30pm or 7:00am -11:30am on weekends

## 2021-08-28 NOTE — Discharge Summary (Signed)
Physician Discharge Summary   Patient: Lauren Schmidt MRN: [id number] DOB: 12/15/1960  Admit date:     08/21/2021  Discharge date: 08/28/21  Discharge Physician: Elmarie Shiley   PCP: No primary care provider on file.   Recommendations at discharge:    Needs to follow up with Sx post cholecystectomy Follow up with Cardiology for CAD.   Discharge Diagnoses: Principal Problem:   Chest pain Active Problems:   Morbid obesity with BMI of 50.0-59.9, adult (HCC)   HLD (hyperlipidemia)   HTN (hypertension)   DM2 (diabetes mellitus, type 2) (HCC)   Abdominal pain, chronic, epigastric   Depressive disorder   Diabetic peripheral neuropathy associated with type 2 diabetes mellitus (HCC)   Dysphagia   Gastroesophageal reflux disease   Obstructive sleep apnea (adult) (pediatric)   Sleep disturbance   Type 2 diabetes mellitus with mild nonproliferative diabetic retinopathy without macular edema, bilateral (HCC)   Gastritis  Resolved Problems:   * No resolved hospital problems. *  Hospital Course: 61 year old with past medical history significant for hypertension, hyperlipidemia, diabetes type 2, morbid obesity presented with epigastric midsternal chest pain that is started 3 weeks prior to admission.  She initially presented to the ED 08/09/2021 and her work-up was negative.  She was discharged on Protonix and Carafate with follow-up with [facility].  Barium swallow was ordered 5/9 that was negative.  She presented with worsening symptoms.   Initial troponin was 69 and subsequently down to 3.  Cardiology was consulted and patient underwent coronary CT moderate diseases, medical treatment recommended.   GI  consulted, endoscopy showed mild gastritis, doesn't account for patient abdominal pain. [place] borderline caliber common bile duct. HIDA scan negative, Underwent HIDA scan with EF, which was positive. She was taken to OR for cholecystectomy.    Assessment and Plan:  1-Chest pain/epigastric pain:  Biliary dyskinesia and possible partial obstruction Mild  elevation of troponin initially at 69 down to 3. Cardiology consulted and patient underwent coronary CTA  Recent barium swallow outpatient was negative Continue with PPI, Carafate. ECHO: Normal ejection fraction CT Coronary ; Moderate coronary diseases. Cardiology recommend medical management  Endoscopy: mild gastritis. GI recommend RUQ [place]. Which showed top normal caliber common bile duct.  -Hida scan order, depending on results might need Surgery evaluation.  -tramadol didn't help with pain. Try vicodin.  Awaiting recommendation from Surgery and GI regarding next step in management.  NM Hepato EF. 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 Underwent Cholecystectomy 5/22. She report epigastric pain has significantly improved after Sx.  She feels well to go home.     2-Hypertension: Continue with metoprolol and lisinopril   3-Diabetes type 2 Continue with Farxiga Sliding scale insulin Hold glipizide at discharge, resume if CBG increases.   3-Morbid  obesity: Needs lifestyle modification   4-HLD; continue with fenofibrate, and crestor.     UTI:  Urine culture insignificant growth.  Completed 3 days antibiotics 5/19             Consultants: Surgery, cardiology , GI Procedures performed: Cholecystectomy Disposition: Home Diet recommendation:  Discharge Diet Orders (From admission, onward)     Start     Ordered   08/28/21 0000  Diet - low sodium heart healthy        08/28/21 1025           Cardiac diet DISCHARGE MEDICATION: Allergies as of 08/28/2021       Reactions   Nitrofurantoin Macrocrystal Shortness Of Breath,  Itching, Swelling, Anaphylaxis   Tape Other (See Comments)   Leave red marks and welts   Shellfish Allergy Rash   Rash         Medication List     STOP taking these medications    glimepiride 4 MG tablet Commonly known as: AMARYL    HYDROcodone-acetaminophen 5-325 MG tablet Commonly known as: NORCO/VICODIN   simvastatin 40 MG tablet Commonly known as: ZOCOR   sucralfate 1 g tablet Commonly known as: CARAFATE Replaced by: sucralfate 1 GM/10ML suspension       TAKE these medications    acetaminophen 500 MG tablet Commonly known as: TYLENOL Take 2 tablets (1,000 mg total) by mouth every 6 (six) hours as needed.   ALPRAZolam 0.5 MG tablet Commonly known as: XANAX Take 0.5 mg by mouth at bedtime as needed for sleep or anxiety.   aspirin EC 81 MG tablet Take 1 tablet (81 mg total) by mouth daily. Swallow whole. Start taking on: Aug 29, 2021   buPROPion 300 MG 24 hr tablet Commonly known as: WELLBUTRIN XL Take 300 mg by mouth daily.   dapagliflozin propanediol 5 MG Tabs tablet Commonly known as: FARXIGA Take 5 mg by mouth at bedtime.   fenofibrate 145 MG tablet Commonly known as: TRICOR Take 145 mg by mouth daily.   lisinopril 20 MG tablet Commonly known as: ZESTRIL Take 20 mg by mouth daily.   metFORMIN 500 MG tablet Commonly known as: GLUCOPHAGE Take 1,000 mg by mouth 2 (two) times daily with a meal. Takes 2 tablets twice a day.   methocarbamol 500 MG tablet Commonly known as: ROBAXIN Take 2 tablets (1,000 mg total) by mouth every 8 (eight) hours as needed for muscle spasms.   metoprolol succinate 50 MG 24 hr tablet Commonly known as: TOPROL-XL Take 50 mg by mouth daily. Take with or immediately following a meal.   multivitamin Liqd Take 5 mLs by mouth daily.   ondansetron 4 MG disintegrating tablet Commonly known as: ZOFRAN-ODT Take 1 tablet (4 mg total) by mouth every 8 (eight) hours as needed for nausea or vomiting.   oxyCODONE 5 MG immediate release tablet Commonly known as: Oxy IR/ROXICODONE Take 1 tablet (5 mg total) by mouth every 4 (four) hours as needed for moderate pain.   pantoprazole 40 MG tablet Commonly known as: Protonix Take 1 tablet (40 mg total) by mouth 2 (two)  times daily.   rosuvastatin 20 MG tablet Commonly known as: CRESTOR Take 1 tablet (20 mg total) by mouth daily. Start taking on: Aug 29, 2021   sertraline 100 MG tablet Commonly known as: ZOLOFT Take 200 mg by mouth daily.   sucralfate 1 GM/10ML suspension Commonly known as: CARAFATE Take 10 mLs (1 g total) by mouth 4 (four) times daily -  with meals and at bedtime. Replaces: sucralfate 1 g tablet        Follow-up Information     Meriam Sprague, MD Follow up on 09/07/2021.   Specialties: Cardiology, Radiology Why: at 10:55 AM with her NP, Rande Brunt information: [address] [place] [address] [phone]         Surgery, [facility] Follow up on 09/18/2021.   Specialty: General Surgery Why: 10am,  please arrive 30 minutes prior to your appointment for paperwork and check in process. Contact information: [address] [place] [address] [phone]                Discharge  Exam: Filed Weights   08/21/21 1037  Weight: 117.9 kg   General ; NAD Abdomen; soft, small incision with dressing.   Condition at discharge: stable  The results of significant diagnostics from this hospitalization (including imaging, microbiology, ancillary and laboratory) are listed below for reference.   Imaging Studies: DG Chest 2 View  Result Date: 08/21/2021 CLINICAL DATA:  61 year old female with history of chest pain and upper abdominal pain intermittently for the past 3 weeks. EXAM: CHEST - 2 VIEW COMPARISON:  Chest x-ray 08/27/2011. FINDINGS: Lung volumes are low. No consolidative airspace disease. No pleural effusions. No pneumothorax. No pulmonary nodule or mass noted. Pulmonary vasculature and the cardiomediastinal silhouette are within normal limits. IMPRESSION: 1. Low lung volumes without radiographic evidence of acute cardiopulmonary disease. Electronically Signed   By: Vinnie Langton M.D.   On: 08/21/2021 05:52    DG Cholangiogram Operative  Result Date: 08/27/2021 CLINICAL DATA:  Intraoperative cholangiogram during laparoscopic cholecystectomy. EXAM: INTRAOPERATIVE CHOLANGIOGRAM FLUOROSCOPY TIME:  10 seconds (5.2 mGy) COMPARISON:  None Available. FINDINGS: Intraoperative cholangiographic images of the right upper abdominal quadrant during laparoscopic cholecystectomy are provided for review. Surgical clips overlie the expected location of the gallbladder fossa. Contrast injection demonstrates selective cannulation of the central aspect of the cystic duct. There is passage of contrast through the central aspect of the cystic duct with filling of a non dilated common bile duct. There is passage of contrast though the CBD and into the descending portion of the duodenum. There is minimal reflux of injected contrast into the common hepatic duct and central aspect of the non dilated intrahepatic biliary system. There are no discrete filling defects within the opacified portions of the biliary system to suggest the presence of choledocholithiasis. IMPRESSION: No evidence of choledocholithiasis. Electronically Signed   By: Sandi Mariscal M.D.   On: 08/27/2021 16:40   NM Hepatobiliary Liver Func  Result Date: 08/24/2021 CLINICAL DATA:  Abdominal pain for 3 weeks. Borderline biliary ductal dilatation on recent ultrasound. EXAM: NUCLEAR MEDICINE HEPATOBILIARY IMAGING TECHNIQUE: Sequential images of the abdomen were obtained out to 60 minutes following intravenous administration of radiopharmaceutical. Delayed image was also obtained at 150 minutes. RADIOPHARMACEUTICALS:  5.5 mCi Tc-107m  Choletec IV COMPARISON:  None Available. FINDINGS: Prompt uptake and biliary excretion of activity by the liver is seen. Gallbladder activity is visualized, consistent with patency of cystic duct. Biliary activity passes into small bowel, consistent with patent common bile duct. IMPRESSION: Normal hepatobiliary scan, demonstrating patency of both  the cystic and common bile ducts. Electronically Signed   By: Marlaine Hind M.D.   On: 08/24/2021 14:57   CT ABDOMEN PELVIS W CONTRAST  Result Date: 08/21/2021 CLINICAL DATA:  61 year old female with history of chest and upper abdominal pain for the past 3 weeks, worsening this morning. EXAM: CT ABDOMEN AND PELVIS WITH CONTRAST TECHNIQUE: Multidetector CT imaging of the abdomen and pelvis was performed using the standard protocol following bolus administration of intravenous contrast. RADIATION DOSE REDUCTION: This exam was performed according to the departmental dose-optimization program which includes automated exposure control, adjustment of the mA and/or kV according to patient size and/or use of iterative reconstruction technique. CONTRAST:  155mL OMNIPAQUE IOHEXOL 300 MG/ML  SOLN COMPARISON:  CT of the abdomen and pelvis 08/09/2021. FINDINGS: Lower chest: Unremarkable. Hepatobiliary: Diffuse low attenuation throughout the hepatic parenchyma, indicative of a background of severe hepatic steatosis. No suspicious cystic or solid hepatic lesions. No intra or extrahepatic biliary ductal dilatation. Gallbladder is normal in appearance. Pancreas:  No pancreatic mass. No pancreatic ductal dilatation. No pancreatic or peripancreatic fluid collections or inflammatory changes. Spleen: Unremarkable. Adrenals/Urinary Tract: In the lower pole of the right kidney there are 2 fat containing lesions, indicative of angiomyolipomas. The largest of these measures 2.3 x 1.4 cm (axial image 48 of series 2). Left kidney and bilateral adrenal glands are normal in appearance. No hydroureteronephrosis. Small amount of gas non dependently in the lumen of the urinary bladder. Urinary bladder is otherwise normal in appearance. Stomach/Bowel: The appearance of the stomach is normal. There is no pathologic dilatation of small bowel or colon. The appendix is not confidently identified and may be surgically absent. Regardless, there are no  inflammatory changes noted adjacent to the cecum to suggest the presence of an acute appendicitis at this time. Vascular/Lymphatic: Minimal aortic atherosclerosis, without evidence of aneurysm or dissection in the abdominal or pelvic vasculature. No lymphadenopathy noted in the abdomen or pelvis. Reproductive: Small coarse calcifications in the fundus of the uterus, likely tiny calcified fibroids. Ovaries are unremarkable in appearance. Other: No significant volume of ascites.  No pneumoperitoneum. Musculoskeletal: There are no aggressive appearing lytic or blastic lesions noted in the visualized portions of the skeleton. IMPRESSION: 1. Gas is present in the lumen of the urinary bladder. This could be iatrogenic in the setting of recent catheterization for urinalysis. If there has been no recent catheterization, however, correlation with urinalysis would be recommended, as the possibility of urinary tract infection with gas-forming organisms is not excluded. 2. No other potential acute findings noted elsewhere in the abdomen or pelvis to account for the patient's symptoms. 3. Severe hepatic steatosis. 4. 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 5. Mild aortic atherosclerosis. Electronically Signed   By: Vinnie Langton M.D.   On: 08/21/2021 06:45   CT ABDOMEN PELVIS W CONTRAST  Result Date: 08/09/2021 CLINICAL DATA:  61 year old female with history of generalized abdominal pain and no recent bowel movement. Suspected bowel obstruction. EXAM: CT ABDOMEN AND PELVIS WITH CONTRAST TECHNIQUE: Multidetector CT imaging of the abdomen and pelvis was performed using the standard protocol following bolus administration of intravenous contrast. RADIATION DOSE REDUCTION: This exam was performed according to the departmental dose-optimization program which includes automated exposure control, adjustment of the mA and/or kV according to patient size and/or use of  iterative reconstruction technique. CONTRAST:  172mL OMNIPAQUE IOHEXOL 300 MG/ML  SOLN COMPARISON:  CT of the abdomen and pelvis 08/23/2016. FINDINGS: Lower chest: Unremarkable. Hepatobiliary: Diffuse low attenuation throughout the hepatic parenchyma, indicative of a background of severe hepatic steatosis. No discrete cystic or solid hepatic lesions. No intra or extrahepatic biliary ductal dilatation. Gallbladder is normal in appearance. Pancreas: No pancreatic mass. No pancreatic ductal dilatation. No pancreatic or peripancreatic fluid collections or inflammatory changes. Spleen: Unremarkable. Adrenals/Urinary Tract: In the lower pole of the right kidney there are 2 small lesions both of which contain fatty attenuation, indicative angiomyolipomas, largest of which measures 1.7 cm in diameter (coronal image 77 of series 5). Left kidney and bilateral adrenal glands are normal in appearance. No hydroureteronephrosis. Urinary bladder is normal in appearance. Stomach/Bowel: The appearance of the stomach is normal. There is no pathologic dilatation of small bowel or colon. Normal appendix. Vascular/Lymphatic: Aortic atherosclerosis, without evidence of aneurysm or dissection in the abdominal or pelvic vasculature. No lymphadenopathy noted in the abdomen or pelvis. Reproductive: Uterus and ovaries are unremarkable in appearance. Other: No significant volume of ascites noted  in the visualized portions of the peritoneal cavity. Musculoskeletal: There are no aggressive appearing lytic or blastic lesions noted in the visualized portions of the skeleton. IMPRESSION: 1. No acute findings are noted in the abdomen or pelvis to account for the patient's symptoms. 2. Hepatic steatosis. 3. Two fat containing lesions in the lower pole of the right kidney, compatible with angiomyolipomas, largest of which measures 1.7 cm in diameter. Electronically Signed   By: Vinnie Langton M.D.   On: 08/09/2021 07:28   NM Hepato W/EF  Result  Date: 08/27/2021 CLINICAL DATA:  Abdominal pain EXAM: NUCLEAR MEDICINE HEPATOBILIARY IMAGING WITH GALLBLADDER EF TECHNIQUE: Sequential images of the abdomen were obtained out to 60 minutes following intravenous administration of radiopharmaceutical. After oral ingestion of Ensure, gallbladder ejection fraction was determined. At 60 min, normal ejection fraction is greater than 33%. RADIOPHARMACEUTICALS:  5.47 mCi Tc-18m  Choletec IV COMPARISON:  None Available. FINDINGS: Liver is enlarged in size. Prompt uptake and biliary excretion of activity by the liver is seen. Gallbladder activity is visualized, consistent with patency of cystic duct. Biliary activity passes into small bowel, consistent with patent common bile duct. Calculated gallbladder ejection fraction is 26%. (Normal gallbladder ejection fraction with Ensure is greater than 33%.) IMPRESSION: There is no obstruction of cystic duct or common bile duct. Estimated gallbladder ejection fraction with fatty meal is 26% which is lower than normal range suggesting possible gallbladder motility dysfunction. Electronically Signed   By: Elmer Picker M.D.   On: 08/27/2021 13:00   CT CORONARY MORPH W/CTA COR W/SCORE W/CA W/CM &/OR WO/CM  Addendum Date: 08/22/2021   ADDENDUM REPORT: 08/22/2021 14:32 HISTORY: Chest pain/anginal equiv, intermediate CAD risk, not treadmill candidate EXAM: Cardiac/Coronary  CT TECHNIQUE: The patient was scanned on a [employer]. PROTOCOL: A 120 kV prospective scan was triggered in the descending thoracic aorta at 111 HU's. Axial non-contrast 3 mm slices were carried out through the heart. The data set was analyzed on a dedicated work station and scored using the Agatston method. Gantry rotation speed was 250 msecs and collimation was .6 mm. Beta blockade and 0.8 mg of sl NTG was given. The 3D data set was reconstructed in 5% intervals of the 35-75 % of the R-R cycle. Systolic and diastolic phases were analyzed on a  dedicated work station using MPR, MIP and VRT modes. The patient received 12mL OMNIPAQUE IOHEXOL 350 MG/ML SOLN contrast. FINDINGS: Image quality: Poor Noise artifact is: Moderate, signal to noise ratio is reduced. Coronary calcium score is 107, which places the patient in the 87th percentile for age and sex matched control. Coronary arteries: Normal coronary origins.  Right dominance. Right Coronary Artery: Mild mixed atherosclerotic plaque in the proximal RCA, 25-49% stenosis. Moderate atherosclerotic plaque in the mid and distal RCA, 50-69% stenosis. Left Main Coronary Artery: No detectable plaque or stenosis. Left Anterior Descending Coronary Artery: Minimal scattered mixed atherosclerotic plaque in the LAD, <25% stenosis. Moderate atherosclerotic plaque in the distal LAD, 50-69% stenosis. Image quality is suboptimal to definitely assess lumen, probable moderate stenosis in the proximal first diagonal artery, 50-69% stenosis. Left Circumflex Artery: Small first OM with calcified plaque, may have stenosis but unable to determine definitively. No other detectable plaque or stenosis in LCx. Aorta: Normal size, 30 mm at the mid ascending aorta (level of the PA bifurcation) measured double oblique. No calcifications. No dissection. Aortic Valve: No calcifications. Other findings: Normal pulmonary vein drainage into the left atrium. Normal left atrial appendage without thrombus. Normal size  of the pulmonary artery. IMPRESSION: 1. 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 CT FFR will be performed and reported separately. 2. Coronary calcium score is 107, which places the patient in the [place] percentile for age and sex matched control. 3. Normal coronary origins with right dominance. Electronically Signed   By: Cherlynn Kaiser M.D.   On: 08/22/2021 14:32   Result Date: 08/22/2021 EXAM: OVER-READ INTERPRETATION  CT CHEST The following report is a limited chest CT  over-read performed by radiologist Dr. Abigail Miyamoto of [facility], [place] on 08/22/2021. This over-read does not include interpretation of cardiac or coronary anatomy or pathology. The coronary CTA interpretation by the cardiologist is attached. COMPARISON:  CTA chest of 1 day prior FINDINGS: Vascular: Aortic atherosclerosis. No central pulmonary embolism, on this non-dedicated study. Mediastinum/Nodes: No imaged thoracic adenopathy. Lungs/Pleura: No pleural fluid. Tiny pulmonary nodules were detailed on yesterday's dedicated diagnostic chest CT. No superimposed acute lobar consolidation. Upper Abdomen: Hepatic steatosis. Normal imaged portions of the spleen, stomach. Musculoskeletal: No acute osseous abnormality. Lower thoracic spondylosis. IMPRESSION: No acute findings in the imaged extracardiac chest. Aortic Atherosclerosis (ICD10-I70.0). Hepatic steatosis. Electronically Signed: By: Abigail Miyamoto M.D. On: 08/22/2021 11:53   DG UGI W DOUBLE CM (HD BA)  Result Date: 08/17/2021 CLINICAL DATA:  A 61 year old female presents with chest discomfort while swallowing. EXAM: UPPER GI SERIES WITH KUB TECHNIQUE: After obtaining a scout radiograph a routine upper GI series was performed using thin and high density barium. FLUOROSCOPY TIME:  Fluoroscopy Time:  2 minutes 48 seconds Radiation Exposure Index (if provided by the fluoroscopic device): 72 mGy Number of Acquired Spot Images: 1 COMPARISON:  CT of the abdomen and pelvis from Aug 09, 2021. FINDINGS: Scout film of the abdomen without signs of obstruction. Thin barium was administered for assessment of swallow function, gross swallow function without signs of gross abnormality related to swallowing. Effervescent crystals and thick barium was then administered showing normal distensibility of the esophagus. No visible lesion or signs of esophageal stricture. Study mildly compromised by patient body habitus. Contrast flowed into the stomach. After coating the stomach  images were obtained which shows no gross fold thickening or gastric lesion. Contrast flowed readily into the duodenum with normal appearance of duodenal bulb and normal duodenal jejunal anatomy. Assessment of esophageal motility shows normal primary wave with complete stripping of the esophagus due to primary peristalsis. Full column assessment with normal caliber of the esophagus, small hiatal hernia. Barium tablet administered and passed without difficulty into the stomach through the esophagus. No gastroesophageal reflux was elicited with positioning or water siphon. IMPRESSION: Small hiatal hernia otherwise normal upper GI evaluation. Patient did experience discomfort with swallowing and related a globus sensation with the pill which passed readily from the esophagus into the stomach. Electronically Signed   By: Zetta Bills M.D.   On: 08/17/2021 11:17   CT CORONARY FRACTIONAL FLOW RESERVE DATA PREP  Result Date: 08/22/2021 EXAM: CT FFR ANALYSIS CLINICAL DATA:  abnormal coronary CT FINDINGS: FFRct analysis was performed on the original cardiac CT angiogram dataset. Diagrammatic representation of the FFRct analysis is provided in a separate PDF document in PACS. This dictation was created using the PDF document and an interactive 3D model of the results. 3D model is not available in the EMR/PACS. Normal FFR range is >0.80. Indeterminate (grey) zone is 0.76-0.80. FFR delta of 0.13 is considered significant. 1. Left Main: FFR = 0.98 2.  LAD: Proximal FFR = 0.94, mid FFR = 0.90, Distal FFR = 0.56, first diagonal FFR = 0.81 3. LCX: Proximal FFR = 0.96, distal FFR = 0.93, OM1 FFR = 0.80 4. RCA: Proximal FFR = 0.96, mid FFR =0.93, Distal FFR = 0.81 IMPRESSION: 1. 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 The vessel is small caliber <3mm in this location. No other hemodynamically significant stenoses. RECOMMENDATIONS: Guideline-directed medical therapy and aggressive risk factor  modification for secondary prevention of coronary artery disease. Electronically Signed   By: Cherlynn Kaiser M.D.   On: 08/22/2021 14:41   CT ANGIO CHEST AORTA W/CM &/OR WO/CM  Result Date: 08/21/2021 CLINICAL DATA:  61 year old female with chest and upper abdominal pain for the past 3 weeks, worsening this morning. Suspected acute aortic syndrome. EXAM: CT ANGIOGRAPHY CHEST WITH CONTRAST TECHNIQUE: Multidetector CT imaging of the chest was performed using the standard protocol during bolus administration of intravenous contrast. Multiplanar CT image reconstructions and MIPs were obtained to evaluate the vascular anatomy. RADIATION DOSE REDUCTION: This exam was performed according to the departmental dose-optimization program which includes automated exposure control, adjustment of the mA and/or kV according to patient size and/or use of iterative reconstruction technique. CONTRAST:  2mL OMNIPAQUE IOHEXOL 350 MG/ML SOLN COMPARISON:  No priors. FINDINGS: Cardiovascular: Precontrast images demonstrate no crescentic high attenuation associated with the wall of the thoracic aorta to suggest the presence of acute intramural hemorrhage. Mild atherosclerosis is noted in the aortic arch. No aneurysm or dissection of the thoracic aorta or the great vessels of the mediastinum. Ascending thoracic aorta, mid arch and descending thoracic aorta measure 2.7 cm, 2.4 cm and 2.5 cm in diameter respectively. No definite coronary artery calcifications. Heart size is normal. There is no significant pericardial fluid, thickening or pericardial calcification. Mediastinum/Nodes: No pathologically enlarged mediastinal or hilar lymph nodes. Esophagus is unremarkable in appearance. No axillary lymphadenopathy. Lungs/Pleura: No pneumothorax. No acute consolidative airspace disease. No pleural effusions. A few scattered tiny 2-3 mm pulmonary nodules are noted in the periphery of the lungs, nonspecific, but statistically likely benign  areas of mucoid impaction within terminal bronchioles. No larger more suspicious appearing pulmonary nodules or masses are noted. Upper Abdomen: Diffuse low attenuation throughout the visualized hepatic parenchyma, indicative of severe hepatic steatosis. Musculoskeletal: There are no aggressive appearing lytic or blastic lesions noted in the visualized portions of the skeleton. Review of the MIP images confirms the above findings. IMPRESSION: 1. No acute findings are noted in the thorax to account for the patient's symptoms. Specifically, no evidence of acute aortic syndrome. 2. Tiny 2-3 mm pulmonary nodules in the lungs bilaterally, nonspecific, but statistically likely benign. No follow-up needed if patient is low-risk (and has no known or suspected primary neoplasm). Non-contrast chest CT can be considered in 12 months if patient is high-risk. This recommendation follows the consensus statement: Guidelines for Management of Incidental Pulmonary Nodules Detected on CT Images: From the Fleischner Society 2017; Radiology 2017; 284:228-243. 3. Aortic atherosclerosis (mild). 4. Severe hepatic steatosis. Aortic Atherosclerosis (ICD10-I70.0). Electronically Signed   By: Vinnie Langton M.D.   On: 08/21/2021 07:28   ECHOCARDIOGRAM COMPLETE  Result Date: 08/21/2021    ECHOCARDIOGRAM REPORT   Patient Name:   RHONNA DRUGAN Date of Exam: 08/21/2021 Medical Rec #:  [id number]    Height:       60.0 in Accession #:    [id number]   Weight:       260.0 lb Date of Birth:  08/29/1960    BSA:          2.087 m Patient Age:    62 years     BP:           153/63 mmHg Patient Gender: F            HR:           70 bpm. Exam Location:  Inpatient Procedure: 2D Echo, Color Doppler, Cardiac Doppler and Intracardiac            Opacification Agent Indications:    Chest pain  History:        Patient has no prior history of Echocardiogram examinations.                 Signs/Symptoms:Chest Pain; Risk Factors:Hypertension, Diabetes                  and HLD.  Sonographer:    Joette Catching RCS Referring Phys: [id number] Jonnie Finner  Sonographer Comments: Image acquisition challenging due to patient body habitus. IMPRESSIONS  1. Left ventricular ejection fraction, by estimation, is 60 to 65%. The left ventricle has normal function. The left ventricle has no regional wall motion abnormalities. Left ventricular diastolic parameters are indeterminate.  2. Right ventricular systolic function is normal. The right ventricular size is normal.  3. Left atrial size was mildly dilated.  4. The mitral valve is normal in structure. No evidence of mitral valve regurgitation. No evidence of mitral stenosis.  5. The aortic valve is normal in structure. Aortic valve regurgitation is not visualized. Aortic valve sclerosis is present, with no evidence of aortic valve stenosis.  6. The inferior vena cava is normal in size with greater than 50% respiratory variability, suggesting right atrial pressure of 3 mmHg. FINDINGS  Left Ventricle: Left ventricular ejection fraction, by estimation, is 60 to 65%. The left ventricle has normal function. The left ventricle has no regional wall motion abnormalities. Definity contrast agent was given IV to delineate the left ventricular  endocardial borders. The left ventricular internal cavity size was normal in size. There is no left ventricular hypertrophy. Left ventricular diastolic parameters are indeterminate. Right Ventricle: The right ventricular size is normal. No increase in right ventricular wall thickness. Right ventricular systolic function is normal. Left Atrium: Left atrial size was mildly dilated. Right Atrium: Right atrial size was normal in size. Pericardium: There is no evidence of pericardial effusion. Mitral Valve: The mitral valve is normal in structure. No evidence of mitral valve regurgitation. No evidence of mitral valve stenosis. Tricuspid Valve: The tricuspid valve is normal in structure. Tricuspid valve regurgitation  is not demonstrated. No evidence of tricuspid stenosis. Aortic Valve: The aortic valve is normal in structure. Aortic valve regurgitation is not visualized. Aortic valve sclerosis is present, with no evidence of aortic valve stenosis. Aortic valve mean gradient measures 5.0 mmHg. Aortic valve peak gradient measures 8.9 mmHg. Aortic valve area, by VTI measures 2.22 cm. Pulmonic Valve: The pulmonic valve was normal in structure. Pulmonic valve regurgitation is not visualized. No evidence of pulmonic stenosis. Aorta: The aortic root is normal in size and structure. Venous: The inferior vena cava is normal in size with greater than 50% respiratory variability, suggesting right atrial pressure of 3 mmHg. IAS/Shunts: No atrial level shunt detected by color flow Doppler.  LEFT VENTRICLE PLAX 2D LVIDd:         4.10 cm      Diastology LVIDs:  3.00 cm      LV e' medial:    6.10 cm/s LV PW:         0.80 cm      LV E/e' medial:  15.5 LV IVS:        0.90 cm      LV e' lateral:   7.62 cm/s LVOT diam:     1.90 cm      LV E/e' lateral: 12.4 LV SV:         68 LV SV Index:   33 LVOT Area:     2.84 cm  LV Volumes (MOD) LV vol d, MOD A2C: 129.0 ml LV vol d, MOD A4C: 97.0 ml LV vol s, MOD A2C: 59.3 ml LV vol s, MOD A4C: 40.6 ml LV SV MOD A2C:     69.7 ml LV SV MOD A4C:     97.0 ml LV SV MOD BP:      60.5 ml RIGHT VENTRICLE RV Basal diam:  3.40 cm RV S prime:     13.90 cm/s TAPSE (M-mode): 2.3 cm LEFT ATRIUM             Index        RIGHT ATRIUM           Index LA diam:        3.90 cm 1.87 cm/m   RA Area:     11.50 cm LA Vol (A2C):   55.6 ml 26.64 ml/m  RA Volume:   21.30 ml  10.21 ml/m LA Vol (A4C):   74.2 ml 35.56 ml/m LA Biplane Vol: 68.5 ml 32.83 ml/m  AORTIC VALVE                     PULMONIC VALVE AV Area (Vmax):    2.15 cm      PV Vmax:       0.91 m/s AV Area (Vmean):   2.16 cm      PV Peak grad:  3.3 mmHg AV Area (VTI):     2.22 cm AV Vmax:           149.00 cm/s AV Vmean:          107.000 cm/s AV VTI:             0.308 m AV Peak Grad:      8.9 mmHg AV Mean Grad:      5.0 mmHg LVOT Vmax:         113.00 cm/s LVOT Vmean:        81.600 cm/s LVOT VTI:          0.241 m LVOT/AV VTI ratio: 0.78  AORTA Ao Root diam: 3.30 cm Ao Asc diam:  3.00 cm MITRAL VALVE MV Area (PHT): 4.89 cm    SHUNTS MV Decel Time: 155 msec    Systemic VTI:  0.24 m MV E velocity: 94.70 cm/s  Systemic Diam: 1.90 cm MV A velocity: 95.50 cm/s MV E/A ratio:  0.99 MV A Prime:    10.4 cm/s Dani Gobble Croitoru MD Electronically signed by Sanda Klein MD Signature Date/Time: 08/21/2021/2:41:55 PM    Final    US Abdomen Limited RUQ (LIVER/GB)  Result Date: 08/24/2021 CLINICAL DATA:  Abdominal pain. EXAM: ULTRASOUND ABDOMEN LIMITED RIGHT UPPER QUADRANT COMPARISON:  CT imaging from Aug 21, 2021. FINDINGS: Gallbladder: Small amount of sludge in the dependent gallbladder. No gallbladder distension or wall thickening. No pericholecystic fluid. Common bile duct: Diameter: 6 mm Liver: Moderate hepatic steatosis.  Fatty sparing near the gallbladder fossa. Portal vein is patent on color Doppler imaging with normal direction of blood flow towards the liver. 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 IMPRESSION: Top-normal caliber of the common bile duct. No signs of cholelithiasis or acute biliary process otherwise. Correlate with laboratory values and clinical presentation to determine whether MRI/MRCP may be warranted. Moderate hepatic steatosis. RIGHT renal angiomyolipomas. Electronically Signed   By: Zetta Bills M.D.   On: 08/24/2021 08:47    Microbiology: Results for orders placed or performed during the hospital encounter of 08/21/21  Urine Culture     Status: Abnormal   Collection Time: 08/22/21  5:27 PM   Specimen: In/Out Cath Urine  Result Value Ref Range Status   Specimen Description   Final    IN/OUT CATH URINE Performed at [facility], [place] [address] [place], [place] [address]    Special Requests   Final     NONE Performed at [facility], [place] [address], [place], [place] [address]    Culture (A)  Final    <10,000 COLONIES/mL INSIGNIFICANT GROWTH Performed at [facility] [address] [place], [place] [address]    Report Status 08/23/2021 FINAL  Final    Labs: CBC: Recent Labs  Lab 08/22/21 0748 08/23/21 0406 08/25/21 0406 08/27/21 0548 08/28/21 0831  WBC 4.9 4.2 4.5 4.3 11.0*  HGB 11.0* 10.5* 10.3* 11.1* 11.3*  HCT 35.8* 34.6* 34.8* 35.9* 39.5  MCV 85.9 86.3 87.2 85.3 89.2  PLT 346 317 355 296 0000000   Basic Metabolic Panel: Recent Labs  Lab 08/22/21 0717 08/23/21 0406 08/25/21 0406 08/27/21 0548 08/28/21 0334 08/28/21 0831  NA 139 140 141 141  --  138  K 3.8 3.7 3.5 4.0  --  4.4  CL 104 106 105 107  --  103  CO2 26 25 25 26   --  25  GLUCOSE 111* 115* 109* 154*  --  165*  BUN 12 11 12 14   --  12  CREATININE 0.80 0.77 0.72 0.79 0.80 0.80  CALCIUM 8.8* 8.7* 9.3 9.1  --  9.4   Liver Function Tests: Recent Labs  Lab 08/24/21 0946  AST 32  ALT 35  ALKPHOS 36*  BILITOT 0.8  PROT 8.1  ALBUMIN 4.0   CBG: Recent Labs  Lab 08/27/21 0733 08/27/21 1257 08/27/21 1732 08/27/21 2118 08/28/21 0733  GLUCAP 143* 168* 166* 198* 127*    Discharge time spent: greater than 30 minutes.  Signed: Elmarie Shiley, MD [facility] 08/28/2021

## 2021-08-28 NOTE — TOC Transition Note (Signed)
Transition of Care [facility]) - CM/SW Discharge Note   Patient Details  Name: Lauren Schmidt MRN: [id number] Date of Birth: [birth year]  Transition of Care [facility] CM/SW Contact:  Golda Acre, RN Phone Number: 08/28/2021, 11:00 AM   Clinical Narrative:    Dcd to home with no toc needs   Final next level of care: Home/Self Care Barriers to Discharge: No Barriers Identified   Patient Goals and CMS Choice Patient states their goals for this hospitalization and ongoing recovery are:: to return home CMS Medicare.gov Compare Post Acute Care list provided to:: Patient    Discharge Placement                       Discharge Plan and Services   Discharge Planning Services: CM Consult                                 Social Determinants of Health (SDOH) Interventions     Readmission Risk Interventions     View : No data to display.

## 2021-08-28 NOTE — Plan of Care (Signed)
Patient being discharged home. PIVs and tele removed per orders. Discharge teaching completed and all questions answered. Patient and all belongings escorted to lobby.    Problem: Health Behavior/Discharge Planning: Goal: Ability to manage health-related needs will improve Outcome: Completed/Met   Problem: Clinical Measurements: Goal: Ability to maintain clinical measurements within normal limits will improve Outcome: Completed/Met Goal: Will remain free from infection Outcome: Completed/Met Goal: Diagnostic test results will improve Outcome: Completed/Met Goal: Cardiovascular complication will be avoided Outcome: Completed/Met   Problem: Activity: Goal: Risk for activity intolerance will decrease Outcome: Completed/Met   Problem: Nutrition: Goal: Adequate nutrition will be maintained Outcome: Completed/Met   Problem: Coping: Goal: Level of anxiety will decrease Outcome: Completed/Met   Problem: Elimination: Goal: Will not experience complications related to bowel motility Outcome: Completed/Met Goal: Will not experience complications related to urinary retention Outcome: Completed/Met   Problem: Pain Managment: Goal: General experience of comfort will improve Outcome: Completed/Met   Problem: Safety: Goal: Ability to remain free from injury will improve Outcome: Completed/Met   Problem: Skin Integrity: Goal: Risk for impaired skin integrity will decrease Outcome: Completed/Met

## 2021-09-04 LAB — SURGICAL PATHOLOGY

## 2021-09-07 ENCOUNTER — Ambulatory Visit (INDEPENDENT_AMBULATORY_CARE_PROVIDER_SITE_OTHER): Payer: 59 | Admitting: Nurse Practitioner

## 2021-09-07 ENCOUNTER — Encounter: Payer: Self-pay | Admitting: Nurse Practitioner

## 2021-09-07 VITALS — BP 136/74 | HR 94 | Ht 60.0 in | Wt 243.8 lb

## 2021-09-07 DIAGNOSIS — I2510 Atherosclerotic heart disease of native coronary artery without angina pectoris: Secondary | ICD-10-CM | POA: Diagnosis not present

## 2021-09-07 DIAGNOSIS — Z7189 Other specified counseling: Secondary | ICD-10-CM

## 2021-09-07 DIAGNOSIS — E785 Hyperlipidemia, unspecified: Secondary | ICD-10-CM

## 2021-09-07 NOTE — Progress Notes (Signed)
Cardiology Office Note:    Date:  09/07/2021   ID:  Lauren LofflerLinda Schmidt, DOB 10/15/1960, MRN [id number]  PCP:  Renford DillsPolite, Ronald, MD   [facility] Providers Cardiologist:  Meriam SpragueHeather E Pemberton, MD     Referring MD: Legrand ComoMcMichael, Bayley M, GeorgiaPA*   Chief Complaint: hospital follow-up chest pain  History of Present Illness:    Lauren LofflerLinda Schmidt is a very pleasant 61 y.o. female with a hx of hypertension, hyperlipidemia, type 2 diabetes, and obesity.   Admission 08/09/21 for abdominal pain and constipation.  History of bowel obstruction.  Improvement with GI cocktail and Prilosec was changed to Protonix.  She was discharged home and asked to follow-up with GI.  Admission 5/16-5/23/23 for chest and abdominal pain.  Initial troponin was 69 with repeat 3, cardiology was asked to evaluate her.  She reported chest pain started 2-1/2 weeks prior after walking possibly half a mile or more.  Pain and shortness of breath did not occur during the walk but started later.  Reported a burning pain in the lower sternum and upper mid epigastric area.  No worsening with activity, no change with deep inspiration or position, no shortness of breath. No prior cardiac history. TTE with normal EF 60-65%, normal RV, no significant valve disease. Coronary CTA with moderate RCA disease and flow limiting disease in distal LAD, recommended for continued medical therapy. Simvastatin was changed to rosuvastatin, aspirin 81 mg, fenofibrate 145 mg added. Continue metoprolol 50 mg. She underwent cholecystectomy on 08/27/21.  Today, she is here for hospital follow-up for chest pain.  She states she is feeling much better and thinks that the significant portion of her pain was due to abdominal adhesions.  She denies chest pain, shortness of breath, lower extremity edema, fatigue, palpitations, melena, hematuria, hemoptysis, diaphoresis, weakness, presyncope, syncope, orthopnea, and PND.  Past Medical History:  Diagnosis Date   Anemia    Anxiety     Bowel obstruction (HCC) 05/14/2016   Depression    Diabetes mellitus without complication (HCC)    type 2   Family history of adverse reaction to anesthesia    sister's bp dropped low during surgery   Fatty liver    GERD (gastroesophageal reflux disease)    History of hiatal hernia    Hypercholesterolemia    Hypertension    Peripheral neuropathy    feet   Sleep apnea    uses cpap   Trigger thumb of both thumbs    Umbilical hernia with gangrene and obstruction 01/31/2013   Repaired on 01/31/13. SB resection done     Past Surgical History:  Procedure Laterality Date   BIOPSY  08/23/2021   Procedure: BIOPSY;  Surgeon: Willis Modenautlaw, William, MD;  Location: [facility];  Service: Gastroenterology;;   BOWEL RESECTION N/A 01/31/2013   Procedure: SMALL BOWEL RESECTION;  Surgeon: Robyne AskewPaul S Toth III, MD;  Location: [facility];  Service: General;  Laterality: N/A;   CHOLECYSTECTOMY N/A 08/27/2021   Procedure: LAPAROSCOPIC CHOLECYSTECTOMY WITH INTRAOPERATIVE CHOLANGIOGRAM, LYSIS OF ADHESIONS, AND LIVER BIOPSY;  Surgeon: Karie SodaGross, Steven, MD;  Location: [facility];  Service: General;  Laterality: N/A;   COLON SURGERY     COLONOSCOPY     ESOPHAGOGASTRODUODENOSCOPY N/A 08/23/2021   Procedure: ESOPHAGOGASTRODUODENOSCOPY (EGD);  Surgeon: Willis Modenautlaw, William, MD;  [facility];  Service: Gastroenterology;  Laterality: N/A;   HERNIA REPAIR     INSERTION OF MESH N/A 07/01/2016   Procedure: INSERTION OF MESH;  Surgeon: Chevis PrettyPaul Toth III, MD;  Location: [facility];  Service: General;  Laterality: N/A;   KNEE ARTHROSCOPY Right 1990s?   LAPAROSCOPIC ASSISTED VENTRAL HERNIA REPAIR  07/01/2016   Right Knee arthroscopy     TRIGGER FINGER RELEASE Right 10/24/2016   Procedure: RELEASE TRIGGER FINGER/A-1 PULLEY RIGHT THUMB TRIGGER RELEASE;  Surgeon: Betha Loa, MD;  Location: [facility];  Service: Orthopedics;  Laterality: Right;   UMBILICAL HERNIA REPAIR N/A 01/31/2013   Procedure: HERNIA REPAIR UMBILICAL ADULT;   Surgeon: Robyne Askew, MD;  Location: [facility];  Service: General;  Laterality: N/A;   VENTRAL HERNIA REPAIR N/A 07/01/2016   Procedure: LAPROSCOPIC ASSISTED VENTRAL HERNIA REPAIR;  Surgeon: Chevis Pretty III, MD;  Location: [facility];  Service: General;  Laterality: N/A;    Current Medications: Current Meds  Medication Sig   ACCRUFER 30 MG CAPS Take by mouth.   acetaminophen (TYLENOL) 500 MG tablet Take 2 tablets (1,000 mg total) by mouth every 6 (six) hours as needed.   ALPRAZolam (XANAX) 0.5 MG tablet Take 0.5 mg by mouth at bedtime as needed for sleep or anxiety.   aspirin EC 81 MG tablet Take 1 tablet (81 mg total) by mouth daily. Swallow whole.   buPROPion (WELLBUTRIN XL) 300 MG 24 hr tablet Take 300 mg by mouth daily.   dapagliflozin propanediol (FARXIGA) 5 MG TABS tablet Take 5 mg by mouth at bedtime.   fenofibrate (TRICOR) 145 MG tablet Take 145 mg by mouth daily.   glimepiride (AMARYL) 4 MG tablet 1 tablet   lisinopril (PRINIVIL,ZESTRIL) 20 MG tablet Take 20 mg by mouth daily.   metFORMIN (GLUCOPHAGE) 500 MG tablet Take 1,000 mg by mouth 2 (two) times daily with a meal. Takes 2 tablets twice a day.    metoprolol succinate (TOPROL-XL) 50 MG 24 hr tablet Take 50 mg by mouth daily. Take with or immediately following a meal.   Multiple Vitamins-Minerals (MULTI FOR HER 50+) CAPS    pantoprazole (PROTONIX) 40 MG tablet Take 1 tablet (40 mg total) by mouth 2 (two) times daily.   rosuvastatin (CRESTOR) 20 MG tablet Take 1 tablet (20 mg total) by mouth daily.   sertraline (ZOLOFT) 100 MG tablet Take 200 mg by mouth daily.    sucralfate (CARAFATE) 1 GM/10ML suspension Take 10 mLs (1 g total) by mouth 4 (four) times daily -  with meals and at bedtime.     Allergies:   Nitrofurantoin macrocrystal, Nitrofurantoin, Shellfish-derived products, Tape, and Shellfish allergy   Social History   Socioeconomic History   Marital status: Single    Spouse name: Not on file   Number of children: Not on file    Years of education: Not on file   Highest education level: Not on file  Occupational History   Not on file  Tobacco Use   Smoking status: Former   Smokeless tobacco: Never   Tobacco comments:    Quit smoking 30 years ago  Vaping Use   Vaping Use: Never used  Substance and Sexual Activity   Alcohol use: No   Drug use: No   Sexual activity: Yes  Other Topics Concern   Not on file  Social History Narrative   Not on file   Social Determinants of Health   Financial Resource Strain: Not on file  Food Insecurity: Not on file  Transportation Needs: Not on file  Physical Activity: Not on file  Stress: Not on file  Social Connections: Not on file     Family History: The patient's family history includes Aneurysm in her  mother; Diabetes in her mother; Heart failure in her mother.  ROS:   Please see the history of present illness.  All other systems reviewed and are negative.  Labs/Other Studies Reviewed:    The following studies were reviewed today:  Coronary CTA  Coronary calcium score is 107, which places the patient in the 87th percentile for age and sex matched control.   Coronary arteries: Normal coronary origins.  Right dominance.   Right Coronary Artery: Mild mixed atherosclerotic plaque in the proximal RCA, 25-49% stenosis. Moderate atherosclerotic plaque in the mid and distal RCA, 50-69% stenosis.   Left Main Coronary Artery: No detectable plaque or stenosis.   Left Anterior Descending Coronary Artery: Minimal scattered mixed atherosclerotic plaque in the LAD, <25% stenosis. Moderate atherosclerotic plaque in the distal LAD, 50-69% stenosis. Image quality is suboptimal to definitely assess lumen, probable moderate stenosis in the proximal first diagonal artery, 50-69% stenosis.   Left Circumflex Artery: Small first OM with calcified plaque, may have stenosis but unable to determine definitively. No other detectable plaque or stenosis in LCx.   Aorta:  Normal size, 30 mm at the mid ascending aorta (level of the PA bifurcation) measured double oblique. No calcifications. No dissection.   Aortic Valve: No calcifications.   Other findings:   Normal pulmonary vein drainage into the left atrium.   Normal left atrial appendage without thrombus.   Normal size of the pulmonary artery.   IMPRESSION: 1. 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 CT FFR will be performed and reported separately.   2. Coronary calcium score is 107, which places the patient in the 87th percentile for age and sex matched control.   3. Normal coronary origins with right dominance.  Echo 08/21/21   1. Left ventricular ejection fraction, by estimation, is 60 to 65%. The  left ventricle has normal function. The left ventricle has no regional  wall motion abnormalities. Left ventricular diastolic parameters are  indeterminate.   2. Right ventricular systolic function is normal. The right ventricular  size is normal.   3. Left atrial size was mildly dilated.   4. The mitral valve is normal in structure. No evidence of mitral valve  regurgitation. No evidence of mitral stenosis.   5. The aortic valve is normal in structure. Aortic valve regurgitation is  not visualized. Aortic valve sclerosis is present, with no evidence of  aortic valve stenosis.   6. The inferior vena cava is normal in size with greater than 50%  respiratory variability, suggesting right atrial pressure of 3 mmHg.   Recent Labs: 08/24/2021: ALT 35 08/28/2021: BUN 12; Creatinine, Ser 0.80; Hemoglobin 11.3; Platelets 370; Potassium 4.4; Sodium 138  Recent Lipid Panel    Component Value Date/Time   CHOL 147 08/22/2021 0358   TRIG 199 (H) 08/22/2021 0358   HDL 14 (L) 08/22/2021 0358   CHOLHDL 10.5 08/22/2021 0358   VLDL 40 08/22/2021 0358   LDLCALC 93 08/22/2021 0358     Risk Assessment/Calculations:         Physical Exam:     VS:  BP 136/74   Pulse 94   Ht 5' (1.524 m)   Wt 243 lb 12.8 oz (110.6 kg)   SpO2 97%   BMI 47.61 kg/m     Wt Readings from Last 3 Encounters:  09/07/21 243 lb 12.8 oz (110.6 kg)  08/21/21 260 lb (117.9 kg)  08/09/21 260 lb (117.9 kg)  GEN:  Well developed obese female in no acute distress HEENT: Normal NECK: No JVD; No carotid bruits CARDIAC: RRR, no murmurs, rubs, gallops RESPIRATORY:  Clear to auscultation without rales, wheezing or rhonchi  ABDOMEN: Soft, non-tender, non-distended MUSCULOSKELETAL:  No edema; No deformity. 2+ pedal pulses, equal bilaterally SKIN: Warm and dry. Bruises in various stages of healing to bilateral forearms from recent phlebotomy NEUROLOGIC:  Alert and oriented x 3 PSYCHIATRIC:  Normal affect   EKG:  EKG is not ordered today.    Diagnoses:    1. Hyperlipidemia LDL goal <70   2. Coronary artery disease involving native coronary artery of native heart without angina pectoris   3. Cardiac risk counseling    Assessment and Plan:     CAD without angina: Nonobstructive moderate CAD in mid and distal RCA and in distal LAD. Likely moderate disease in the proximal first diagonal.  We discussed the importance of risk factor reduction. She denies chest pain, dyspnea, or other symptoms concerning for angina.  No indication for further ischemic evaluation at this time  Hyperlipidemia LDL goal < 70: Coronary calcium score of 107, 87th percentile for age/sex matched control. LDL 93 on 08/22/21.  Encouraged mostly plant-based diet, and regular physical activity.  We will plan to recheck lipid and liver panel in 1 month.  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 Continue rosuvastatin.  Cardiac risk factor counseling: We discussed the importance of healthy lifestyle in the setting of nonobstructive CAD.  Encouraged heart healthy, mostly plant-based diet, regular physical activity to achieve 150 minutes of moderate exercise each  week.   Disposition: 3 months with Dr. Shari Prows   Medication Adjustments/Labs and Tests Ordered: Current medicines are reviewed at length with the patient today.  Concerns regarding medicines are outlined above.  Orders Placed This Encounter  Procedures   Lipid panel   Hepatic function panel   No orders of the defined types were placed in this encounter.   Patient Instructions  Medication Instructions:  Your physician recommends that you continue on your current medications as directed. Please refer to the Current Medication list given to you today.  *If you need a refill on your cardiac medications before your next appointment, please call your pharmacy*   Lab Work: Lipid and liver in July If you have labs (blood work) drawn today and your tests are completely normal, you will receive your results only by: MyChart Message (if you have MyChart) OR A paper copy in the mail If you have any lab test that is abnormal or we need to change your treatment, we will call you to review the results.   Follow-Up: At [facility], you and your health needs are our priority.  As part of our continuing mission to provide you with exceptional heart care, we have created designated Provider Care Teams.  These Care Teams include your primary Cardiologist (physician) and Advanced Practice Providers (APPs -  Physician Assistants and Nurse Practitioners) who all work together to provide you with the care you need, when you need it.   Your next appointment:   03/08/22 at 9:40 AM  The format for your next appointment:   In Person  Provider:   Dr Shari Prows  Important Information About Sugar         Signed, Daaiel Starlin, Zachary George, NP  09/07/2021 5:51 PM    [facility]

## 2021-09-07 NOTE — Patient Instructions (Signed)
Medication Instructions:  Your physician recommends that you continue on your current medications as directed. Please refer to the Current Medication list given to you today.  *If you need a refill on your cardiac medications before your next appointment, please call your pharmacy*   Lab Work: Lipid and liver in July If you have labs (blood work) drawn today and your tests are completely normal, you will receive your results only by: MyChart Message (if you have MyChart) OR A paper copy in the mail If you have any lab test that is abnormal or we need to change your treatment, we will call you to review the results.   Follow-Up: At [facility], you and your health needs are our priority.  As part of our continuing mission to provide you with exceptional heart care, we have created designated Provider Care Teams.  These Care Teams include your primary Cardiologist (physician) and Advanced Practice Providers (APPs -  Physician Assistants and Nurse Practitioners) who all work together to provide you with the care you need, when you need it.   Your next appointment:   03/08/22 at 9:40 AM  The format for your next appointment:   In Person  Provider:   Dr Shari Prows  Important Information About Sugar

## 2021-12-04 ENCOUNTER — Encounter (HOSPITAL_COMMUNITY): Payer: Self-pay | Admitting: Gastroenterology

## 2021-12-11 NOTE — Anesthesia Preprocedure Evaluation (Signed)
Anesthesia Evaluation  Patient identified by MRN, date of birth, ID band Patient awake    Reviewed: Allergy & Precautions, NPO status , Patient's Chart, lab work & pertinent test results  History of Anesthesia Complications Negative for: history of anesthetic complications  Airway Mallampati: II  TM Distance: >3 FB Neck ROM: Full    Dental no notable dental hx.    Pulmonary sleep apnea and Continuous Positive Airway Pressure Ventilation , former smoker,    Pulmonary exam normal        Cardiovascular hypertension, Pt. on medications Normal cardiovascular exam     Neuro/Psych Anxiety Depression negative neurological ROS     GI/Hepatic Neg liver ROS, hiatal hernia, GERD  Medicated and Controlled,  Endo/Other  diabetes, Type 2, Oral Hypoglycemic AgentsMorbid obesity (BMI 49)  Renal/GU negative Renal ROS  negative genitourinary   Musculoskeletal negative musculoskeletal ROS (+)   Abdominal   Peds  Hematology  (+) Blood dyscrasia, anemia ,   Anesthesia Other Findings Day of surgery medications reviewed with patient.  Reproductive/Obstetrics negative OB ROS                            Anesthesia Physical Anesthesia Plan  ASA: 3  Anesthesia Plan: MAC   Post-op Pain Management: Minimal or no pain anticipated   Induction:   PONV Risk Score and Plan: 2 and Treatment may vary due to age or medical condition and Propofol infusion  Airway Management Planned: Natural Airway and Nasal Cannula  Additional Equipment: None  Intra-op Plan:   Post-operative Plan:   Informed Consent: I have reviewed the patients History and Physical, chart, labs and discussed the procedure including the risks, benefits and alternatives for the proposed anesthesia with the patient or authorized representative who has indicated his/her understanding and acceptance.       Plan Discussed with: CRNA  Anesthesia  Plan Comments:        Anesthesia Quick Evaluation

## 2021-12-12 ENCOUNTER — Encounter (HOSPITAL_COMMUNITY): Payer: Self-pay | Admitting: Gastroenterology

## 2021-12-12 ENCOUNTER — Ambulatory Visit (HOSPITAL_COMMUNITY): Payer: 59 | Admitting: Anesthesiology

## 2021-12-12 ENCOUNTER — Ambulatory Visit (HOSPITAL_COMMUNITY)
Admission: RE | Admit: 2021-12-12 | Discharge: 2021-12-12 | Disposition: A | Discharge status: Home / Self Care | Payer: 59 | Attending: Gastroenterology | Admitting: Gastroenterology

## 2021-12-12 ENCOUNTER — Ambulatory Visit (HOSPITAL_BASED_OUTPATIENT_CLINIC_OR_DEPARTMENT_OTHER): Payer: 59 | Admitting: Anesthesiology

## 2021-12-12 ENCOUNTER — Encounter (HOSPITAL_COMMUNITY)
Admission: RE | Disposition: A | Discharge status: Home / Self Care | Payer: Self-pay | Source: Home / Self Care | Attending: Gastroenterology

## 2021-12-12 ENCOUNTER — Other Ambulatory Visit: Payer: Self-pay

## 2021-12-12 DIAGNOSIS — E119 Type 2 diabetes mellitus without complications: Secondary | ICD-10-CM | POA: Diagnosis not present

## 2021-12-12 DIAGNOSIS — K635 Polyp of colon: Secondary | ICD-10-CM | POA: Diagnosis not present

## 2021-12-12 DIAGNOSIS — Z1211 Encounter for screening for malignant neoplasm of colon: Secondary | ICD-10-CM | POA: Insufficient documentation

## 2021-12-12 DIAGNOSIS — Z6841 Body Mass Index (BMI) 40.0 and over, adult: Secondary | ICD-10-CM | POA: Insufficient documentation

## 2021-12-12 DIAGNOSIS — F418 Other specified anxiety disorders: Secondary | ICD-10-CM | POA: Diagnosis not present

## 2021-12-12 DIAGNOSIS — K219 Gastro-esophageal reflux disease without esophagitis: Secondary | ICD-10-CM | POA: Diagnosis not present

## 2021-12-12 DIAGNOSIS — G4730 Sleep apnea, unspecified: Secondary | ICD-10-CM | POA: Diagnosis not present

## 2021-12-12 DIAGNOSIS — K648 Other hemorrhoids: Secondary | ICD-10-CM

## 2021-12-12 DIAGNOSIS — I10 Essential (primary) hypertension: Secondary | ICD-10-CM | POA: Insufficient documentation

## 2021-12-12 DIAGNOSIS — Z79899 Other long term (current) drug therapy: Secondary | ICD-10-CM | POA: Diagnosis not present

## 2021-12-12 DIAGNOSIS — Z7984 Long term (current) use of oral hypoglycemic drugs: Secondary | ICD-10-CM | POA: Diagnosis not present

## 2021-12-12 DIAGNOSIS — Z87891 Personal history of nicotine dependence: Secondary | ICD-10-CM | POA: Insufficient documentation

## 2021-12-12 HISTORY — PX: COLONOSCOPY WITH PROPOFOL: SHX5780

## 2021-12-12 HISTORY — PX: POLYPECTOMY: SHX5525

## 2021-12-12 LAB — GLUCOSE, CAPILLARY: Glucose-Capillary: 178 mg/dL — ABNORMAL HIGH (ref 70–99)

## 2021-12-12 SURGERY — COLONOSCOPY WITH PROPOFOL
Anesthesia: Monitor Anesthesia Care

## 2021-12-12 MED ORDER — PROPOFOL 10 MG/ML IV BOLUS
INTRAVENOUS | Status: DC | PRN
  Administered 2021-12-12: 30 mg via INTRAVENOUS

## 2021-12-12 MED ORDER — PROPOFOL 500 MG/50ML IV EMUL
INTRAVENOUS | Status: AC
  Filled 2021-12-12: qty 50

## 2021-12-12 MED ORDER — PROPOFOL 500 MG/50ML IV EMUL
INTRAVENOUS | Status: DC | PRN
  Administered 2021-12-12: 100 ug/kg/min via INTRAVENOUS

## 2021-12-12 MED ORDER — PROPOFOL 1000 MG/100ML IV EMUL
INTRAVENOUS | Status: AC
  Filled 2021-12-12: qty 100

## 2021-12-12 MED ORDER — LACTATED RINGERS IV SOLN: INTRAVENOUS | Status: DC

## 2021-12-12 MED ORDER — PHENYLEPHRINE 80 MCG/ML (10ML) SYRINGE FOR IV PUSH (FOR BLOOD PRESSURE SUPPORT)
PREFILLED_SYRINGE | INTRAVENOUS | Status: DC | PRN
  Administered 2021-12-12: 160 ug via INTRAVENOUS

## 2021-12-12 MED ORDER — SODIUM CHLORIDE 0.9 % IV SOLN: INTRAVENOUS | Status: DC

## 2021-12-12 SURGICAL SUPPLY — 22 items

## 2021-12-12 NOTE — Anesthesia Postprocedure Evaluation (Signed)
Anesthesia Post Note  Patient: Lauren Schmidt  Procedure(s) Performed: COLONOSCOPY WITH PROPOFOL POLYPECTOMY     Patient location during evaluation: PACU Anesthesia Type: MAC Level of consciousness: awake and alert Pain management: pain level controlled Vital Signs Assessment: post-procedure vital signs reviewed and stable Respiratory status: spontaneous breathing, nonlabored ventilation and respiratory function stable Cardiovascular status: blood pressure returned to baseline Postop Assessment: no apparent nausea or vomiting Anesthetic complications: no   No notable events documented.  Last Vitals:  Vitals:   12/12/21 0910 12/12/21 0920  BP: (!) 102/44 (!) 110/50  Pulse: 67 71  Resp: 17 14  Temp: 36.6 C   SpO2: 100% 100%    Last Pain:  Vitals:   12/12/21 0920  TempSrc:   PainSc: 0-No pain                 Marthenia Rolling

## 2021-12-12 NOTE — H&P (Signed)
[facility] H/P Note  Chief Complaint: screening for colon cancer  HPI: Lauren Schmidt is an 61 y.o. female.  Here for colon cancer screening (BMI over 50).  No abdominal pain, blood in stool, change in bowel habits, unintentional weight loss.  Last colonoscopy 2012 unrevealing.  No known family history of colon cancer or polyps.  Past Medical History:  Diagnosis Date   Anemia    Anxiety    Bowel obstruction ([place]) 05/14/2016   Depression    Diabetes mellitus without complication ([place])    type 2   Family history of adverse reaction to anesthesia    sister's bp dropped low during surgery   Fatty liver    GERD (gastroesophageal reflux disease)    History of hiatal hernia    Hypercholesterolemia    Hypertension    Peripheral neuropathy    feet   Sleep apnea    uses cpap   Trigger thumb of both thumbs    Umbilical hernia with gangrene and obstruction 01/31/2013   Repaired on 01/31/13. SB resection done     Past Surgical History:  Procedure Laterality Date   BIOPSY  08/23/2021   Procedure: BIOPSY;  Surgeon: Arta Silence, MD;  Location: [facility];  Service: Gastroenterology;;   BOWEL RESECTION N/A 01/31/2013   Procedure: SMALL BOWEL RESECTION;  Surgeon: Merrie Roof, MD;  Location: [facility];  Service: General;  Laterality: N/A;   CHOLECYSTECTOMY N/A 08/27/2021   Procedure: LAPAROSCOPIC CHOLECYSTECTOMY WITH INTRAOPERATIVE CHOLANGIOGRAM, LYSIS OF ADHESIONS, AND LIVER BIOPSY;  Surgeon: Michael Boston, MD;  Location: [facility];  Service: General;  Laterality: N/A;   COLON SURGERY     COLONOSCOPY     ESOPHAGOGASTRODUODENOSCOPY N/A 08/23/2021   Procedure: ESOPHAGOGASTRODUODENOSCOPY (EGD);  Surgeon: Arta Silence, MD;  Location: [facility];  Service: Gastroenterology;  Laterality: N/A;   HERNIA REPAIR     INSERTION OF MESH N/A 07/01/2016   Procedure: INSERTION OF MESH;  Surgeon: Autumn Messing III, MD;  Location: [facility];  Service: General;  Laterality: N/A;   KNEE ARTHROSCOPY Right  1990s?   LAPAROSCOPIC ASSISTED VENTRAL HERNIA REPAIR  07/01/2016   Right Knee arthroscopy     TRIGGER FINGER RELEASE Right 10/24/2016   Procedure: RELEASE TRIGGER FINGER/A-1 PULLEY RIGHT THUMB TRIGGER RELEASE;  Surgeon: Leanora Cover, MD;  Location: [facility];  Service: Orthopedics;  Laterality: Right;   UMBILICAL HERNIA REPAIR N/A 01/31/2013   Procedure: HERNIA REPAIR UMBILICAL ADULT;  Surgeon: Merrie Roof, MD;  Location: [facility];  Service: General;  Laterality: N/A;   VENTRAL HERNIA REPAIR N/A 07/01/2016   Procedure: LAPROSCOPIC ASSISTED VENTRAL HERNIA REPAIR;  Surgeon: Autumn Messing III, MD;  Location: [facility];  Service: General;  Laterality: N/A;    Medications Prior to Admission  Medication Sig Dispense Refill   ACCRUFER 30 MG CAPS Take 30 mg by mouth in the morning and at bedtime.     ALPRAZolam (XANAX) 0.25 MG tablet Take 0.25 mg by mouth daily as needed for anxiety.     aspirin EC 81 MG tablet Take 1 tablet (81 mg total) by mouth daily. Swallow whole. (Patient taking differently: Take 81 mg by mouth every evening. Swallow whole.) 30 tablet 12   buPROPion (WELLBUTRIN XL) 300 MG 24 hr tablet Take 300 mg by mouth in the morning.     Cholecalciferol (VITAMIN D-3) 125 MCG (5000 UT) TABS Take 5,000 Units by mouth in the morning.     dapagliflozin propanediol (FARXIGA) 5 MG TABS tablet  Take 5 mg by mouth at bedtime.     fenofibrate (TRICOR) 145 MG tablet Take 145 mg by mouth in the morning.     glimepiride (AMARYL) 4 MG tablet Take 4 mg by mouth in the morning.     lisinopril (PRINIVIL,ZESTRIL) 20 MG tablet Take 20 mg by mouth in the morning.     metFORMIN (GLUCOPHAGE) 500 MG tablet Take 1,000 mg by mouth 2 (two) times daily with a meal. Takes 2 tablets twice a day.      metoprolol succinate (TOPROL-XL) 50 MG 24 hr tablet Take 50 mg by mouth in the morning. Take with or immediately following a meal.     Multiple Vitamin (MULTIVITAMIN WITH MINERALS) TABS tablet Take 1 tablet by mouth  in the morning.     omeprazole (PRILOSEC) 20 MG capsule Take 20 mg by mouth daily before breakfast.     rosuvastatin (CRESTOR) 20 MG tablet Take 1 tablet (20 mg total) by mouth daily. (Patient taking differently: Take 20 mg by mouth every evening.) 30 tablet 2   sertraline (ZOLOFT) 100 MG tablet Take 150 mg by mouth in the morning.     acetaminophen (TYLENOL) 500 MG tablet Take 2 tablets (1,000 mg total) by mouth every 6 (six) hours as needed. 30 tablet 0   methocarbamol (ROBAXIN) 500 MG tablet Take 2 tablets (1,000 mg total) by mouth every 8 (eight) hours as needed for muscle spasms. (Patient not taking: Reported on 12/05/2021) 30 tablet 0   ondansetron (ZOFRAN-ODT) 4 MG disintegrating tablet Take 1 tablet (4 mg total) by mouth every 8 (eight) hours as needed for nausea or vomiting. (Patient not taking: Reported on 12/05/2021) 10 tablet 0   oxyCODONE (OXY IR/ROXICODONE) 5 MG immediate release tablet Take 1 tablet (5 mg total) by mouth every 4 (four) hours as needed for moderate pain. (Patient not taking: Reported on 12/05/2021) 15 tablet 0   pantoprazole (PROTONIX) 40 MG tablet Take 1 tablet (40 mg total) by mouth 2 (two) times daily. (Patient not taking: Reported on 12/05/2021) 60 tablet 1   sucralfate (CARAFATE) 1 GM/10ML suspension Take 10 mLs (1 g total) by mouth 4 (four) times daily -  with meals and at bedtime. (Patient not taking: Reported on 12/05/2021) 420 mL 0    Allergies:  Allergies  Allergen Reactions   Nitrofurantoin Macrocrystal Shortness Of Breath, Itching, Swelling and Anaphylaxis   Tape Other (See Comments)    Leave red marks and welts   Shellfish Allergy Swelling and Rash    Family History  Problem Relation Age of Onset   Heart failure Mother    Aneurysm Mother    Diabetes Mother     Social History:  reports that she has quit smoking. She has never used smokeless tobacco. She reports that she does not drink alcohol and does not use drugs.   ROS As per HPI, all others  negative   Blood pressure (!) 115/42, pulse 76, temperature 98.2 F (36.8 C), temperature source Temporal, resp. rate 15, height 4\' 11"  (1.499 m), weight 111.1 kg, SpO2 96 %. General appearance: NAD, obese HEENT:  [place]/AT, thick neck, anicteric NEURO:  A/O, no lateralizing signs ABD:  Protuberant, soft CV:  Regular  Results for orders placed or performed during the hospital encounter of 12/12/21 (from the past 48 hour(s))  Glucose, capillary     Status: Abnormal   Collection Time: 12/12/21  7:32 AM  Result Value Ref Range   Glucose-Capillary 178 (H) 70 - 99 mg/dL  Comment: Glucose reference range applies only to samples taken after fasting for at least 8 hours.   No results found.  Assessment/Plan   Screening for colon cancer, average risk. Risks (bleeding, infection, bowel perforation that could require surgery, sedation-related changes in cardiopulmonary systems), benefits (identification and possible treatment of source of symptoms, exclusion of certain causes of symptoms), and alternatives (watchful waiting, radiographic imaging studies, empiric medical treatment) of colonoscopy were explained to patient/family in detail and patient wishes to proceed.   Freddy Jaksch 12/12/2021, 8:35 AM

## 2021-12-12 NOTE — Op Note (Signed)
[facility] Patient Name: Lauren Schmidt Procedure Date: 12/12/2021 MRN: [id number] Attending MD: Willis Modena , MD Date of Birth: 11-18-1960 CSN: [id number] Age: 61 Admit Type: Outpatient Procedure:                Colonoscopy Indications:              Screening for colorectal malignant neoplasm, Last                            colonoscopy: 2012 Providers:                Willis Modena, MD, Lorenza Evangelist, RN, Salley Scarlet, Technician, Judeth Cornfield [place], CRNA Referring MD:              Medicines:                Monitored Anesthesia Care Complications:            No immediate complications. Estimated Blood Loss:     Estimated blood loss: none. Procedure:                Pre-Anesthesia Assessment:                           - Prior to the procedure, a History and Physical                            was performed, and patient medications and                            allergies were reviewed. The patient's tolerance of                            previous anesthesia was also reviewed. The risks                            and benefits of the procedure and the sedation                            options and risks were discussed with the patient.                            All questions were answered, and informed consent                            was obtained. Prior Anticoagulants: The patient has                            taken no previous anticoagulant or antiplatelet                            agents except for aspirin. ASA Grade Assessment:                            III - A  patient with severe systemic disease. After                            reviewing the risks and benefits, the patient was                            deemed in satisfactory condition to undergo the                            procedure.                           After obtaining informed consent, the colonoscope                            was passed under direct vision.  Throughout the                            procedure, the patient's blood pressure, pulse, and                            oxygen saturations were monitored continuously. The                            CF-HQ190L ([id number]) Olympus colonoscope was                            introduced through the anus and advanced to the the                            cecum, identified by appendiceal orifice and                            ileocecal valve. The ileocecal valve, appendiceal                            orifice, and rectum were photographed. The entire                            colon was examined. The colonoscopy was performed                            without difficulty. The patient tolerated the                            procedure well. The quality of the bowel                            preparation was good. Scope In: 8:47:40 AM Scope Out: 9:03:18 AM Scope Withdrawal Time: 0 hours 11 minutes 28 seconds  Total Procedure Duration: 0 hours 15 minutes 38 seconds  Findings:      Hemorrhoids were found on perianal exam.      A 5 mm polyp was found in the transverse colon. The polyp was sessile.  The polyp was removed with a hot snare. Resection and retrieval were       complete.      Internal hemorrhoids were found during retroflexion. The hemorrhoids       were moderate.      The exam was otherwise without abnormality on direct and retroflexion       views. Impression:               - Hemorrhoids found on perianal exam.                           - One 5 mm polyp in the transverse colon, removed                            with a hot snare. Resected and retrieved.                           - Internal hemorrhoids.                           - The examination was otherwise normal on direct                            and retroflexion views.                           - The examination was otherwise normal. Moderate Sedation:      None Recommendation:           - Patient has a contact number  available for                            emergencies. The signs and symptoms of potential                            delayed complications were discussed with the                            patient. Return to normal activities tomorrow.                            Written discharge instructions were provided to the                            patient.                           - Discharge patient to home (via wheelchair).                           - High fiber diet indefinitely.                           - Continue present medications.                           - Await pathology results.                           -  Repeat colonoscopy (date not yet determined) for                            surveillance based on pathology results.                           - Return to GI clinic PRN.                           - Return to referring physician as previously                            scheduled. Procedure Code(s):        --- Professional ---                           [phone], Colonoscopy, flexible; with removal of                            tumor(s), polyp(s), or other lesion(s) by snare                            technique Diagnosis Code(s):        --- Professional ---                           Z12.11, Encounter for screening for malignant                            neoplasm of colon                           K64.8, Other hemorrhoids                           K63.5, Polyp of colon CPT copyright 2019 [employer]. All rights reserved. The codes documented in this report are preliminary and upon coder review may  be revised to meet current compliance requirements. Willis Modena, MD 12/12/2021 9:10:21 AM This report has been signed electronically. Number of Addenda: 0

## 2021-12-12 NOTE — Discharge Instructions (Signed)

## 2021-12-12 NOTE — Transfer of Care (Signed)
Immediate Anesthesia Transfer of Care Note  Patient: Lauren Schmidt  Procedure(s) Performed: COLONOSCOPY WITH PROPOFOL POLYPECTOMY  Patient Location: PACU and Endoscopy Unit  Anesthesia Type:MAC  Level of Consciousness: awake, alert  and oriented  Airway & Oxygen Therapy: Patient Spontanous Breathing and Patient connected to face mask oxygen  Post-op Assessment: Report given to RN and Post -op Vital signs reviewed and stable  Post vital signs: Reviewed and stable  Last Vitals:  Vitals Value Taken Time  BP    Temp    Pulse    Resp    SpO2      Last Pain:  Vitals:   12/12/21 0720  TempSrc: Temporal  PainSc: 0-No pain         Complications: No notable events documented.

## 2021-12-13 LAB — SURGICAL PATHOLOGY

## 2021-12-14 ENCOUNTER — Encounter (HOSPITAL_COMMUNITY): Payer: Self-pay | Admitting: Gastroenterology

## 2022-02-07 ENCOUNTER — Ambulatory Visit: Payer: 59 | Admitting: Cardiology

## 2022-03-07 NOTE — Progress Notes (Signed)
Cardiology Office Note:    Date:  03/08/2022   ID:  Lauren LofflerLinda Schmidt, DOB 07/02/1960, MRN [id number]  PCP:  Lauren Schmidt, Ronald, MD   [facility] Providers Cardiologist:  Meriam SpragueHeather E Revia Nghiem, MD {  Referring MD: Lauren Schmidt, Ronald, MD    History of Present Illness:    Lauren Schmidt is a 61 y.o. female with a hx of DMII, HTN, HLD, GERD, morbid obesity and history of bowel obstruction who presents to clinic for follow-up.  Patient was admitted in 08/2021 for persistent chest pain x 3 weeks with mild trop elevation at 69. TTE 08/21/21 with normal EF 60-65% no RWMA no valve dx or evidence of pulmonary HTN. Cardiac CTA 08/22/21 with moderate RCA disease and only positive FFR in distal LAD, recommended for continued medical therapy. Simvastatin was changed to rosuvastatin, aspirin 81 mg, fenofibrate 145 mg added. She underwent cholecystectomy on 08/27/21.   Was last seen in clinic by Eligha BridegroomMichelle Schmidt on 09/07/21 where she was doing better. No chest pain or abdominal pain.  Today, the patient overall feels well. No chest pain, SOB, orthopnea or PND. No LE edema, palpitations, lightheadedness or dizziness. Does not monitor blood pressure at home but states it is usually better than current in MD office.   Past Medical History:  Diagnosis Date   Anemia    Anxiety    Bowel obstruction (HCC) 05/14/2016   Depression    Diabetes mellitus without complication (HCC)    type 2   Family history of adverse reaction to anesthesia    sister's bp dropped low during surgery   Fatty liver    GERD (gastroesophageal reflux disease)    History of hiatal hernia    Hypercholesterolemia    Hypertension    Peripheral neuropathy    feet   Sleep apnea    uses cpap   Trigger thumb of both thumbs    Umbilical hernia with gangrene and obstruction 01/31/2013   Repaired on 01/31/13. SB resection done     Past Surgical History:  Procedure Laterality Date   BIOPSY  08/23/2021   Procedure: BIOPSY;  Surgeon: Lauren Schmidt,  William, MD;  Location: [facility];  Service: Gastroenterology;;   BOWEL RESECTION N/A 01/31/2013   Procedure: SMALL BOWEL RESECTION;  Surgeon: Lauren AskewPaul S Toth III, MD;  Location: [facility];  Service: General;  Laterality: N/A;   CHOLECYSTECTOMY N/A 08/27/2021   Procedure: LAPAROSCOPIC CHOLECYSTECTOMY WITH INTRAOPERATIVE CHOLANGIOGRAM, LYSIS OF ADHESIONS, AND LIVER BIOPSY;  Surgeon: Lauren Schmidt, Steven, MD;  Location: [facility];  Service: General;  Laterality: N/A;   COLON SURGERY     COLONOSCOPY     COLONOSCOPY WITH PROPOFOL N/A 12/12/2021   Procedure: COLONOSCOPY WITH PROPOFOL;  Surgeon: Lauren Schmidt, William, MD;  Location: [facility];  Service: Gastroenterology;  Laterality: N/A;   ESOPHAGOGASTRODUODENOSCOPY N/A 08/23/2021   Procedure: ESOPHAGOGASTRODUODENOSCOPY (EGD);  Surgeon: Lauren Schmidt, William, MD;  [facility];  Service: Gastroenterology;  Laterality: N/A;   HERNIA REPAIR     INSERTION OF MESH N/A 07/01/2016   Procedure: INSERTION OF MESH;  Surgeon: Lauren PrettyPaul Toth III, MD;  Location: [facility];  Service: General;  Laterality: N/A;   KNEE ARTHROSCOPY Right 1990s?   LAPAROSCOPIC ASSISTED VENTRAL HERNIA REPAIR  07/01/2016   POLYPECTOMY  12/12/2021   Procedure: POLYPECTOMY;  Surgeon: Lauren Schmidt, William, MD;  Location: [facility];  Service: Gastroenterology;;   Right Knee arthroscopy     TRIGGER FINGER RELEASE Right 10/24/2016   Procedure: RELEASE TRIGGER FINGER/A-1 PULLEY RIGHT THUMB TRIGGER RELEASE;  Surgeon: Lauren Schmidt, Kevin,  MD;  Location: [facility];  Service: Orthopedics;  Laterality: Right;   UMBILICAL HERNIA REPAIR N/A 01/31/2013   Procedure: HERNIA REPAIR UMBILICAL ADULT;  Surgeon: Lauren Askew, MD;  Location: [facility];  Service: General;  Laterality: N/A;   VENTRAL HERNIA REPAIR N/A 07/01/2016   Procedure: LAPROSCOPIC ASSISTED VENTRAL HERNIA REPAIR;  Surgeon: Lauren Pretty III, MD;  Location: [facility];  Service: General;  Laterality: N/A;    Current Medications: Current Meds  Medication Sig    ACCRUFER 30 MG CAPS Take 30 mg by mouth in the morning and at bedtime.   acetaminophen (TYLENOL) 500 MG tablet Take 2 tablets (1,000 mg total) by mouth every 6 (six) hours as needed.   ALPRAZolam (XANAX) 0.25 MG tablet Take 0.25 mg by mouth daily as needed for anxiety.   aspirin EC 81 MG tablet Take 1 tablet (81 mg total) by mouth daily. Swallow whole. (Patient taking differently: Take 81 mg by mouth every evening. Swallow whole.)   buPROPion (WELLBUTRIN XL) 300 MG 24 hr tablet Take 300 mg by mouth in the morning.   Cholecalciferol (VITAMIN D-3) 125 MCG (5000 UT) TABS Take 5,000 Units by mouth in the morning.   dapagliflozin propanediol (FARXIGA) 5 MG TABS tablet Take 5 mg by mouth at bedtime.   ezetimibe (ZETIA) 10 MG tablet Take 1 tablet (10 mg total) by mouth daily.   fenofibrate (TRICOR) 145 MG tablet Take 145 mg by mouth in the morning.   glimepiride (AMARYL) 4 MG tablet Take 4 mg by mouth in the morning.   lisinopril (PRINIVIL,ZESTRIL) 20 MG tablet Take 20 mg by mouth in the morning.   metFORMIN (GLUCOPHAGE) 500 MG tablet Take 1,000 mg by mouth 2 (two) times daily with a meal. Takes 2 tablets twice a day.    metoprolol succinate (TOPROL-XL) 50 MG 24 hr tablet Take 50 mg by mouth in the morning. Take with or immediately following a meal.   Multiple Vitamin (MULTIVITAMIN WITH MINERALS) TABS tablet Take 1 tablet by mouth in the morning.   omeprazole (PRILOSEC) 20 MG capsule Take 20 mg by mouth daily before breakfast.   rosuvastatin (CRESTOR) 20 MG tablet Take 1 tablet (20 mg total) by mouth daily. (Patient taking differently: Take 20 mg by mouth every evening.)   sertraline (ZOLOFT) 100 MG tablet Take 150 mg by mouth in the morning.     Allergies:   Nitrofurantoin macrocrystal, Tape, and Shellfish allergy   Social History   Socioeconomic History   Marital status: Single    Spouse name: Not on file   Number of children: Not on file   Years of education: Not on file   Highest education  level: Not on file  Occupational History   Not on file  Tobacco Use   Smoking status: Former   Smokeless tobacco: Never   Tobacco comments:    Quit smoking 30 years ago  Vaping Use   Vaping Use: Never used  Substance and Sexual Activity   Alcohol use: No   Drug use: No   Sexual activity: Yes  Other Topics Concern   Not on file  Social History Narrative   Not on file   Social Determinants of Health   Financial Resource Strain: Not on file  Food Insecurity: Not on file  Transportation Needs: Not on file  Physical Activity: Not on file  Stress: Not on file  Social Connections: Not on file     Family History: The patient's family history  includes Aneurysm in her mother; Diabetes in her mother; Heart failure in her mother.  ROS:   Please see the history of present illness.     All other systems reviewed and are negative.  EKGs/Labs/Other Studies Reviewed:    The following studies were reviewed today: Coronary CTA 08/22/21: FINDINGS: Image quality: Poor   Noise artifact is: Moderate, signal to noise ratio is reduced.   Coronary calcium score is 107, which places the patient in the 87th percentile for age and sex matched control.   Coronary arteries: Normal coronary origins.  Right dominance.   Right Coronary Artery: Mild mixed atherosclerotic plaque in the proximal RCA, 25-49% stenosis. Moderate atherosclerotic plaque in the mid and distal RCA, 50-69% stenosis.   Left Main Coronary Artery: No detectable plaque or stenosis.   Left Anterior Descending Coronary Artery: Minimal scattered mixed atherosclerotic plaque in the LAD, <25% stenosis. Moderate atherosclerotic plaque in the distal LAD, 50-69% stenosis. Image quality is suboptimal to definitely assess lumen, probable moderate stenosis in the proximal first diagonal artery, 50-69% stenosis.   Left Circumflex Artery: Small first OM with calcified plaque, may have stenosis but unable to determine definitively.  No other detectable plaque or stenosis in LCx.   Aorta: Normal size, 30 mm at the mid ascending aorta (level of the PA bifurcation) measured double oblique. No calcifications. No dissection.   Aortic Valve: No calcifications.   Other findings:   Normal pulmonary vein drainage into the left atrium.   Normal left atrial appendage without thrombus.   Normal size of the pulmonary artery.   IMPRESSION: 1. 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 CT FFR will be performed and reported separately.   2. Coronary calcium score is 107, which places the patient in the 87th percentile for age and sex matched control.   3. Normal coronary origins with right dominance.   FFR 08/22/21: FINDINGS: FFRct analysis was performed on the original cardiac CT angiogram dataset. Diagrammatic representation of the FFRct analysis is provided in a separate PDF document in PACS. This dictation was created using the PDF document and an interactive 3D model of the results. 3D model is not available in the EMR/PACS. Normal FFR range is >0.80. Indeterminate (grey) zone is 0.76-0.80. FFR delta of 0.13 is considered significant.   1. Left Main: FFR = 0.98   2. LAD: Proximal FFR = 0.94, mid FFR = 0.90, Distal FFR = 0.56, first diagonal FFR = 0.81 3. LCX: Proximal FFR = 0.96, distal FFR = 0.93, OM1 FFR = 0.80 4. RCA: Proximal FFR = 0.96, mid FFR =0.93, Distal FFR = 0.81   IMPRESSION: 1. 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 The vessel is small caliber <39mm in this location. No other hemodynamically significant stenoses.   RECOMMENDATIONS: Guideline-directed medical therapy and aggressive risk factor modification for secondary prevention of coronary artery disease.     TTE 08/21/21: IMPRESSIONS     1. Left ventricular ejection fraction, by estimation, is 60 to 65%. The  left ventricle has normal function.  The left ventricle has no regional  wall motion abnormalities. Left ventricular diastolic parameters are  indeterminate.   2. Right ventricular systolic function is normal. The right ventricular  size is normal.   3. Left atrial size was mildly dilated.   4. The mitral valve is normal in structure. No evidence of mitral valve  regurgitation. No evidence of mitral stenosis.  5. The aortic valve is normal in structure. Aortic valve regurgitation is  not visualized. Aortic valve sclerosis is present, with no evidence of  aortic valve stenosis.   6. The inferior vena cava is normal in size with greater than 50%  respiratory variability, suggesting right atrial pressure of 3 mmHg.   EKG:  EKG is not ordered today.    Recent Labs: 08/24/2021: ALT 35 08/28/2021: BUN 12; Creatinine, Ser 0.80; Hemoglobin 11.3; Platelets 370; Potassium 4.4; Sodium 138  Recent Lipid Panel    Component Value Date/Time   CHOL 147 08/22/2021 0358   TRIG 199 (H) 08/22/2021 0358   HDL 14 (L) 08/22/2021 0358   CHOLHDL 10.5 08/22/2021 0358   VLDL 40 08/22/2021 0358   LDLCALC 93 08/22/2021 0358     Risk Assessment/Calculations:                Physical Exam:    VS:  BP 129/85   Pulse 70   Ht 4\' 11"  (1.499 m)   Wt 245 lb 12.8 oz (111.5 kg)   SpO2 98%   BMI 49.65 kg/m     Wt Readings from Last 3 Encounters:  03/08/22 245 lb 12.8 oz (111.5 kg)  12/12/21 245 lb (111.1 kg)  09/07/21 243 lb 12.8 oz (110.6 kg)     GEN:  Well nourished, well developed in no acute distress HEENT: Normal NECK: No JVD; No carotid bruits CARDIAC: RRR, 1/6 systolic murmur RESPIRATORY:  Clear to auscultation without rales, wheezing or rhonchi  ABDOMEN: Soft, non-tender, non-distended MUSCULOSKELETAL:  No edema; No deformity  SKIN: Warm and dry NEUROLOGIC:  Alert and oriented x 3 PSYCHIATRIC:  Normal affect   ASSESSMENT:    1. Coronary artery disease involving native coronary artery of native heart without angina  pectoris   2. Hyperlipidemia LDL goal <70   3. Primary hypertension   4. Morbid obesity with BMI of 50.0-59.9, adult (HCC)   5. Diabetes mellitus with coincident hypertension (HCC)    PLAN:    In order of problems listed above:  #CAD: Coronary CTA with moderate disease in RCA and moderate disease in distal LAD with positive FFR in distal LAD. Recommended for continued medical management.  -Continue crestor 20mg  daily -Start zetia 10mg  daily -Conitnue ASA 81mg  daily -Continue fenofibtrate 145mg  daily -Continue metop 50mg  XL daily  #HTN: -Continue metop 50mg  XL and lisinopril 20mg  daily -BP well controlled on re-check today; she will monitor at home   #DMII: -Continue farxiga 5mg  daily -Follow-up with PCP as scheduled   #HLD: LDL 103 which is above goal of <70. -Continue fenofibtrate 145mg  daily -Continue crestor 20mg  daily -Start zetia 10mg  daily -Will repeat lipids with PCP in 05/2022 per patient preference  #Morbid Obesity: BMI 50. Continue to work on lifestyle modifications           Medication Adjustments/Labs and Tests Ordered: Current medicines are reviewed at length with the patient today.  Concerns regarding medicines are outlined above.  No orders of the defined types were placed in this encounter.  Meds ordered this encounter  Medications   ezetimibe (ZETIA) 10 MG tablet    Sig: Take 1 tablet (10 mg total) by mouth daily.    Dispense:  90 tablet    Refill:  3    Patient Instructions  Medication Instructions:   START TAKING ZETIA 10 MG BY MOUTH DAILY  *If you need a refill on your cardiac medications before your next appointment, please call your pharmacy*  Lab Work:  HAVE YOUR PCP SEND [place] THE RESULTS OF YOUR LIPIDS WHEN THEY ARE COMPLETE--THEY CAN FAX THEM TO OUR OFFICE AT [phone] ATTN: DR. Shari Prows  If you have labs (blood work) drawn today and your tests are completely normal, you will receive your results only by: MyChart Message  (if you have MyChart) OR A paper copy in the mail If you have any lab test that is abnormal or we need to change your treatment, we will call you to review the results.    Follow-Up: At [facility], you and your health needs are our priority.  As part of our continuing mission to provide you with exceptional heart care, we have created designated Provider Care Teams.  These Care Teams include your primary Cardiologist (physician) and Advanced Practice Providers (APPs -  Physician Assistants and Nurse Practitioners) who all work together to provide you with the care you need, when you need it.  We recommend signing up for the patient portal called "MyChart".  Sign up information is provided on this After Visit Summary.  MyChart is used to connect with patients for Virtual Visits (Telemedicine).  Patients are able to view lab/test results, encounter notes, upcoming appointments, etc.  Non-urgent messages can be sent to your provider as well.   To learn more about what you can do with MyChart, go to [email or web address].    Your next appointment:   6 month(s)  The format for your next appointment:   In Person  Provider:   Meriam Sprague, MD       Important Information About Sugar         Signed, Meriam Sprague, MD  03/08/2022 10:11 AM    [facility]

## 2022-03-08 ENCOUNTER — Encounter: Payer: Self-pay | Admitting: Cardiology

## 2022-03-08 ENCOUNTER — Ambulatory Visit: Payer: 59 | Attending: Cardiology | Admitting: Cardiology

## 2022-03-08 VITALS — BP 129/85 | HR 70 | Ht 59.0 in | Wt 245.8 lb

## 2022-03-08 DIAGNOSIS — I10 Essential (primary) hypertension: Secondary | ICD-10-CM

## 2022-03-08 DIAGNOSIS — E785 Hyperlipidemia, unspecified: Secondary | ICD-10-CM | POA: Diagnosis not present

## 2022-03-08 DIAGNOSIS — I2510 Atherosclerotic heart disease of native coronary artery without angina pectoris: Secondary | ICD-10-CM

## 2022-03-08 DIAGNOSIS — E119 Type 2 diabetes mellitus without complications: Secondary | ICD-10-CM

## 2022-03-08 DIAGNOSIS — Z6841 Body Mass Index (BMI) 40.0 and over, adult: Secondary | ICD-10-CM

## 2022-03-08 MED ORDER — EZETIMIBE 10 MG PO TABS: 10.0000 mg | ORAL_TABLET | Freq: Every day | ORAL | 3 refills | Status: DC

## 2022-03-08 NOTE — Patient Instructions (Signed)
Medication Instructions:   START TAKING ZETIA 10 MG BY MOUTH DAILY  *If you need a refill on your cardiac medications before your next appointment, please call your pharmacy*   Lab Work:  HAVE YOUR PCP SEND [place] THE RESULTS OF YOUR LIPIDS WHEN THEY ARE COMPLETE--THEY CAN FAX THEM TO OUR OFFICE AT [phone] ATTN: DR. Shari Prows  If you have labs (blood work) drawn today and your tests are completely normal, you will receive your results only by: MyChart Message (if you have MyChart) OR A paper copy in the mail If you have any lab test that is abnormal or we need to change your treatment, we will call you to review the results.    Follow-Up: At [facility], you and your health needs are our priority.  As part of our continuing mission to provide you with exceptional heart care, we have created designated Provider Care Teams.  These Care Teams include your primary Cardiologist (physician) and Advanced Practice Providers (APPs -  Physician Assistants and Nurse Practitioners) who all work together to provide you with the care you need, when you need it.  We recommend signing up for the patient portal called "MyChart".  Sign up information is provided on this After Visit Summary.  MyChart is used to connect with patients for Virtual Visits (Telemedicine).  Patients are able to view lab/test results, encounter notes, upcoming appointments, etc.  Non-urgent messages can be sent to your provider as well.   To learn more about what you can do with MyChart, go to [email or web address].    Your next appointment:   6 month(s)  The format for your next appointment:   In Person  Provider:   Meriam Sprague, MD       Important Information About Sugar

## 2022-03-28 ENCOUNTER — Other Ambulatory Visit: Payer: Self-pay

## 2022-03-28 MED ORDER — EZETIMIBE 10 MG PO TABS: 10.0000 mg | ORAL_TABLET | Freq: Every day | ORAL | 3 refills | Status: DC

## 2022-06-05 LAB — LAB REPORT - SCANNED
A1c: 6.8
EGFR: 79

## 2022-09-17 ENCOUNTER — Telehealth: Payer: Self-pay

## 2022-09-17 ENCOUNTER — Encounter: Payer: Self-pay | Admitting: Internal Medicine

## 2022-09-17 NOTE — Telephone Encounter (Signed)
Patient aware provider reviewed labs and provider message. She has appointment Friday

## 2022-09-17 NOTE — Telephone Encounter (Signed)
-----   Message from Meriam Sprague, MD sent at 09/17/2022  1:47 PM EDT ----- Thank you for sending labs! Cholesterol looks excellent!

## 2022-09-18 NOTE — Progress Notes (Signed)
Cardiology Office Note:    Date:  09/20/2022   ID:  Lauren Schmidt, DOB 02-Aug-1960, MRN [id number]  PCP:  Renford Dills, MD   [facility] Providers Cardiologist:  Meriam Sprague, MD {  Referring MD: Renford Dills, MD    History of Present Illness:    Lauren Schmidt is a 62 y.o. female with a hx of DMII, HTN, HLD, GERD, morbid obesity and history of bowel obstruction who presents to clinic for follow-up.  Patient was admitted in 08/2021 for persistent chest pain x 3 weeks with mild trop elevation at 69. TTE 08/21/21 with normal EF 60-65% no RWMA no valve dx or evidence of pulmonary HTN. Cardiac CTA 08/22/21 with moderate RCA disease and only positive FFR in distal LAD, recommended for continued medical therapy. Simvastatin was changed to rosuvastatin, aspirin 81 mg, fenofibrate 145 mg added. She underwent cholecystectomy on 08/27/21.   Was last seen in clinic in 03/2022 where she was doing well from a CV standpoint.  Today, the patient overall feels well. No chest pain, SOB, orthopnea, PND, or LE edema. Admits that she is more sedentary job and is not as active as she would like to be. She is able to work in the yard on the weekends without significant symptoms. Blood pressure is not being monitored at home but is 150s in the office.   Past Medical History:  Diagnosis Date   Anemia    Anxiety    Bowel obstruction (HCC) 05/14/2016   Depression    Diabetes mellitus without complication (HCC)    type 2   Family history of adverse reaction to anesthesia    sister's bp dropped low during surgery   Fatty liver    GERD (gastroesophageal reflux disease)    History of hiatal hernia    Hypercholesterolemia    Hypertension    Peripheral neuropathy    feet   Sleep apnea    uses cpap   Trigger thumb of both thumbs    Umbilical hernia with gangrene and obstruction 01/31/2013   Repaired on 01/31/13. SB resection done     Past Surgical History:  Procedure Laterality Date    BIOPSY  08/23/2021   Procedure: BIOPSY;  Surgeon: Willis Modena, MD;  Location: [facility];  Service: Gastroenterology;;   BOWEL RESECTION N/A 01/31/2013   Procedure: SMALL BOWEL RESECTION;  Surgeon: Robyne Askew, MD;  Location: [facility];  Service: General;  Laterality: N/A;   CHOLECYSTECTOMY N/A 08/27/2021   Procedure: LAPAROSCOPIC CHOLECYSTECTOMY WITH INTRAOPERATIVE CHOLANGIOGRAM, LYSIS OF ADHESIONS, AND LIVER BIOPSY;  Surgeon: Karie Soda, MD;  Location: [facility];  Service: General;  Laterality: N/A;   COLON SURGERY     COLONOSCOPY     COLONOSCOPY WITH PROPOFOL N/A 12/12/2021   Procedure: COLONOSCOPY WITH PROPOFOL;  Surgeon: Willis Modena, MD;  Location: [facility];  Service: Gastroenterology;  Laterality: N/A;   ESOPHAGOGASTRODUODENOSCOPY N/A 08/23/2021   Procedure: ESOPHAGOGASTRODUODENOSCOPY (EGD);  Surgeon: Willis Modena, MD;  Location: [facility];  Service: Gastroenterology;  Laterality: N/A;   HERNIA REPAIR     INSERTION OF MESH N/A 07/01/2016   Procedure: INSERTION OF MESH;  Surgeon: Chevis Pretty III, MD;  Location: [facility];  Service: General;  Laterality: N/A;   KNEE ARTHROSCOPY Right 1990s?   LAPAROSCOPIC ASSISTED VENTRAL HERNIA REPAIR  07/01/2016   POLYPECTOMY  12/12/2021   Procedure: POLYPECTOMY;  Surgeon: Willis Modena, MD;  Location: [facility];  Service: Gastroenterology;;   Right Knee arthroscopy     TRIGGER FINGER  RELEASE Right 10/24/2016   Procedure: RELEASE TRIGGER FINGER/A-1 PULLEY RIGHT THUMB TRIGGER RELEASE;  Surgeon: Betha Loa, MD;  Location: [facility];  Service: Orthopedics;  Laterality: Right;   UMBILICAL HERNIA REPAIR N/A 01/31/2013   Procedure: HERNIA REPAIR UMBILICAL ADULT;  Surgeon: Robyne Askew, MD;  Location: [facility];  Service: General;  Laterality: N/A;   VENTRAL HERNIA REPAIR N/A 07/01/2016   Procedure: LAPROSCOPIC ASSISTED VENTRAL HERNIA REPAIR;  Surgeon: Chevis Pretty III, MD;  Location: [facility];  Service: General;  Laterality: N/A;     Current Medications: Current Meds  Medication Sig   acetaminophen (TYLENOL) 500 MG tablet Take 2 tablets (1,000 mg total) by mouth every 6 (six) hours as needed.   ALPRAZolam (XANAX) 0.25 MG tablet Take 0.25 mg by mouth daily as needed for anxiety.   aspirin EC 81 MG tablet Take 1 tablet (81 mg total) by mouth daily. Swallow whole. (Patient taking differently: Take 81 mg by mouth every evening. Swallow whole.)   buPROPion (WELLBUTRIN XL) 300 MG 24 hr tablet Take 300 mg by mouth in the morning.   Cholecalciferol (VITAMIN D-3) 125 MCG (5000 UT) TABS Take 5,000 Units by mouth in the morning.   dapagliflozin propanediol (FARXIGA) 5 MG TABS tablet Take 5 mg by mouth at bedtime.   ezetimibe (ZETIA) 10 MG tablet Take 1 tablet (10 mg total) by mouth daily.   fenofibrate (TRICOR) 145 MG tablet Take 145 mg by mouth in the morning.   glimepiride (AMARYL) 4 MG tablet Take 4 mg by mouth in the morning.   lisinopril (PRINIVIL,ZESTRIL) 20 MG tablet Take 20 mg by mouth in the morning.   metFORMIN (GLUCOPHAGE) 500 MG tablet Take 1,000 mg by mouth 2 (two) times daily with a meal. Takes 2 tablets twice a day.    metoprolol succinate (TOPROL-XL) 50 MG 24 hr tablet Take 50 mg by mouth in the morning. Take with or immediately following a meal.   Multiple Vitamin (MULTIVITAMIN WITH MINERALS) TABS tablet Take 1 tablet by mouth in the morning.   omeprazole (PRILOSEC) 20 MG capsule Take 20 mg by mouth daily before breakfast.   rosuvastatin (CRESTOR) 20 MG tablet Take 1 tablet (20 mg total) by mouth daily. (Patient taking differently: Take 20 mg by mouth every evening.)   sertraline (ZOLOFT) 100 MG tablet Take 150 mg by mouth in the morning.   valACYclovir (VALTREX) 500 MG tablet Take 1 tablet by mouth daily.     Allergies:   Nitrofurantoin macrocrystal, Tape, and Shellfish allergy   Social History   Socioeconomic History   Marital status: Single    Spouse name: Not on file   Number of children: Not on file    Years of education: Not on file   Highest education level: Not on file  Occupational History   Not on file  Tobacco Use   Smoking status: Former   Smokeless tobacco: Never   Tobacco comments:    Quit smoking 30 years ago  Vaping Use   Vaping Use: Never used  Substance and Sexual Activity   Alcohol use: No   Drug use: No   Sexual activity: Yes  Other Topics Concern   Not on file  Social History Narrative   Not on file   Social Determinants of Health   Financial Resource Strain: Not on file  Food Insecurity: Not on file  Transportation Needs: Not on file  Physical Activity: Not on file  Stress: Not on file  Social  Connections: Not on file     Family History: The patient's family history includes Aneurysm in her mother; Diabetes in her mother; Heart failure in her mother.  ROS:   Please see the history of present illness.     All other systems reviewed and are negative.  EKGs/Labs/Other Studies Reviewed:    The following studies were reviewed today: Cardiac Studies & Procedures       ECHOCARDIOGRAM  ECHOCARDIOGRAM COMPLETE 08/21/2021  Narrative ECHOCARDIOGRAM REPORT    Patient Name:   KYLYN BENKO Date of Exam: 08/21/2021 Medical Rec #:  [id number]    Height:       60.0 in Accession #:    [id number]   Weight:       260.0 lb Date of Birth:  26-Mar-1961    BSA:          2.087 m Patient Age:    60 years     BP:           153/63 mmHg Patient Gender: F            HR:           70 bpm. Exam Location:  Inpatient  Procedure: 2D Echo, Color Doppler, Cardiac Doppler and Intracardiac Opacification Agent  Indications:    Chest pain  History:        Patient has no prior history of Echocardiogram examinations. Signs/Symptoms:Chest Pain; Risk Factors:Hypertension, Diabetes and HLD.  Sonographer:    Rodrigo Ran RCS Referring Phys: [id number] Teddy Spike   Sonographer Comments: Image acquisition challenging due to patient body habitus. IMPRESSIONS   1. Left  ventricular ejection fraction, by estimation, is 60 to 65%. The left ventricle has normal function. The left ventricle has no regional wall motion abnormalities. Left ventricular diastolic parameters are indeterminate. 2. Right ventricular systolic function is normal. The right ventricular size is normal. 3. Left atrial size was mildly dilated. 4. The mitral valve is normal in structure. No evidence of mitral valve regurgitation. No evidence of mitral stenosis. 5. The aortic valve is normal in structure. Aortic valve regurgitation is not visualized. Aortic valve sclerosis is present, with no evidence of aortic valve stenosis. 6. The inferior vena cava is normal in size with greater than 50% respiratory variability, suggesting right atrial pressure of 3 mmHg.  FINDINGS Left Ventricle: Left ventricular ejection fraction, by estimation, is 60 to 65%. The left ventricle has normal function. The left ventricle has no regional wall motion abnormalities. Definity contrast agent was given IV to delineate the left ventricular endocardial borders. The left ventricular internal cavity size was normal in size. There is no left ventricular hypertrophy. Left ventricular diastolic parameters are indeterminate.  Right Ventricle: The right ventricular size is normal. No increase in right ventricular wall thickness. Right ventricular systolic function is normal.  Left Atrium: Left atrial size was mildly dilated.  Right Atrium: Right atrial size was normal in size.  Pericardium: There is no evidence of pericardial effusion.  Mitral Valve: The mitral valve is normal in structure. No evidence of mitral valve regurgitation. No evidence of mitral valve stenosis.  Tricuspid Valve: The tricuspid valve is normal in structure. Tricuspid valve regurgitation is not demonstrated. No evidence of tricuspid stenosis.  Aortic Valve: The aortic valve is normal in structure. Aortic valve regurgitation is not visualized. Aortic  valve sclerosis is present, with no evidence of aortic valve stenosis. Aortic valve mean gradient measures 5.0 mmHg. Aortic valve peak gradient measures 8.9 mmHg. Aortic valve area, by  VTI measures 2.22 cm.  Pulmonic Valve: The pulmonic valve was normal in structure. Pulmonic valve regurgitation is not visualized. No evidence of pulmonic stenosis.  Aorta: The aortic root is normal in size and structure.  Venous: The inferior vena cava is normal in size with greater than 50% respiratory variability, suggesting right atrial pressure of 3 mmHg.  IAS/Shunts: No atrial level shunt detected by color flow Doppler.   LEFT VENTRICLE PLAX 2D LVIDd:         4.10 cm      Diastology LVIDs:         3.00 cm      LV e' medial:    6.10 cm/s LV PW:         0.80 cm      LV E/e' medial:  15.5 LV IVS:        0.90 cm      LV e' lateral:   7.62 cm/s LVOT diam:     1.90 cm      LV E/e' lateral: 12.4 LV SV:         68 LV SV Index:   33 LVOT Area:     2.84 cm  LV Volumes (MOD) LV vol d, MOD A2C: 129.0 ml LV vol d, MOD A4C: 97.0 ml LV vol s, MOD A2C: 59.3 ml LV vol s, MOD A4C: 40.6 ml LV SV MOD A2C:     69.7 ml LV SV MOD A4C:     97.0 ml LV SV MOD BP:      60.5 ml  RIGHT VENTRICLE RV Basal diam:  3.40 cm RV S prime:     13.90 cm/s TAPSE (M-mode): 2.3 cm  LEFT ATRIUM             Index        RIGHT ATRIUM           Index LA diam:        3.90 cm 1.87 cm/m   RA Area:     11.50 cm LA Vol (A2C):   55.6 ml 26.64 ml/m  RA Volume:   21.30 ml  10.21 ml/m LA Vol (A4C):   74.2 ml 35.56 ml/m LA Biplane Vol: 68.5 ml 32.83 ml/m AORTIC VALVE                     PULMONIC VALVE AV Area (Vmax):    2.15 cm      PV Vmax:       0.91 m/s AV Area (Vmean):   2.16 cm      PV Peak grad:  3.3 mmHg AV Area (VTI):     2.22 cm AV Vmax:           149.00 cm/s AV Vmean:          107.000 cm/s AV VTI:            0.308 m AV Peak Grad:      8.9 mmHg AV Mean Grad:      5.0 mmHg LVOT Vmax:         113.00 cm/s LVOT  Vmean:        81.600 cm/s LVOT VTI:          0.241 m LVOT/AV VTI ratio: 0.78  AORTA Ao Root diam: 3.30 cm Ao Asc diam:  3.00 cm  MITRAL VALVE MV Area (PHT): 4.89 cm    SHUNTS MV Decel Time: 155 msec    Systemic VTI:  0.24 m MV E  velocity: 94.70 cm/s  Systemic Diam: 1.90 cm MV A velocity: 95.50 cm/s MV E/A ratio:  0.99 MV A Prime:    10.4 cm/s  Rachelle Hora Croitoru MD Electronically signed by Thurmon Fair MD Signature Date/Time: 08/21/2021/2:41:55 PM    Final     CT SCANS  CT CORONARY MORPH W/CTA COR W/SCORE 08/22/2021  Addendum 08/22/2021  2:35 PM ADDENDUM REPORT: 08/22/2021 14:32  HISTORY: Chest pain/anginal equiv, intermediate CAD risk, not treadmill candidate  EXAM: Cardiac/Coronary  CT  TECHNIQUE: The patient was scanned on a [employer].  PROTOCOL: A 120 kV prospective scan was triggered in the descending thoracic aorta at 111 HU's. Axial non-contrast 3 mm slices were carried out through the heart. The data set was analyzed on a dedicated work station and scored using the Agatston method. Gantry rotation speed was 250 msecs and collimation was .6 mm. Beta blockade and 0.8 mg of sl NTG was given. The 3D data set was reconstructed in 5% intervals of the 35-75 % of the R-R cycle. Systolic and diastolic phases were analyzed on a dedicated work station using MPR, MIP and VRT modes. The patient received OMNIPAQUE IOHEXOL 350 MG/ML SOLN contrast.  FINDINGS: Image quality: Poor  Noise artifact is: Moderate, signal to noise ratio is reduced.  Coronary calcium score is 107, which places the patient in the 87th percentile for age and sex matched control.  Coronary arteries: Normal coronary origins.  Right dominance.  Right Coronary Artery: Mild mixed atherosclerotic plaque in the proximal RCA, 25-49% stenosis. Moderate atherosclerotic plaque in the mid and distal RCA, 50-69% stenosis.  Left Main Coronary Artery: No detectable plaque or  stenosis.  Left Anterior Descending Coronary Artery: Minimal scattered mixed atherosclerotic plaque in the LAD, <25% stenosis. Moderate atherosclerotic plaque in the distal LAD, 50-69% stenosis. Image quality is suboptimal to definitely assess lumen, probable moderate stenosis in the proximal first diagonal artery, 50-69% stenosis.  Left Circumflex Artery: Small first OM with calcified plaque, may have stenosis but unable to determine definitively. No other detectable plaque or stenosis in LCx.  Aorta: Normal size, 30 mm at the mid ascending aorta (level of the PA bifurcation) measured double oblique. No calcifications. No dissection.  Aortic Valve: No calcifications.  Other findings:  Normal pulmonary vein drainage into the left atrium.  Normal left atrial appendage without thrombus.  Normal size of the pulmonary artery.  IMPRESSION: 1. 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 CT FFR will be performed and reported separately.  2. Coronary calcium score is 107, which places the patient in the 87th percentile for age and sex matched control.  3. Normal coronary origins with right dominance.   Electronically Signed By: Weston Brass M.D. On: 08/22/2021 14:32  Narrative EXAM: OVER-READ INTERPRETATION  CT CHEST  The following report is a limited chest CT over-read performed by radiologist Dr. Jeronimo Greaves of [facility] on 08/22/2021. This over-read does not include interpretation of cardiac or coronary anatomy or pathology. The coronary CTA interpretation by the cardiologist is attached.  COMPARISON:  CTA chest of 1 day prior  FINDINGS: Vascular: Aortic atherosclerosis. No central pulmonary embolism, on this non-dedicated study.  Mediastinum/Nodes: No imaged thoracic adenopathy.  Lungs/Pleura: No pleural fluid. Tiny pulmonary nodules were detailed on yesterday's dedicated diagnostic chest  CT. No superimposed acute lobar consolidation.  Upper Abdomen: Hepatic steatosis. Normal imaged portions of the spleen, stomach.  Musculoskeletal: No acute osseous abnormality. Lower  thoracic spondylosis.  IMPRESSION: No acute findings in the imaged extracardiac chest.  Aortic Atherosclerosis (ICD10-I70.0).  Hepatic steatosis.  Electronically Signed: By: Jeronimo Greaves M.D. On: 08/22/2021 11:53           EKG:  NSR, moderate LVH, inferior q waves-personally reviewed  Recent Labs: No results found for requested labs within last 365 days.  Recent Lipid Panel    Component Value Date/Time   CHOL 147 08/22/2021 0358   TRIG 199 (H) 08/22/2021 0358   HDL 14 (L) 08/22/2021 0358   CHOLHDL 10.5 08/22/2021 0358   VLDL 40 08/22/2021 0358   LDLCALC 93 08/22/2021 0358     Risk Assessment/Calculations:                Physical Exam:    VS:  BP 120/76   Pulse 80   Ht 4\' 11"  (1.499 m)   Wt 255 lb 12.8 oz (116 kg)   SpO2 96%   BMI 51.67 kg/m     Wt Readings from Last 3 Encounters:  09/20/22 255 lb 12.8 oz (116 kg)  03/08/22 245 lb 12.8 oz (111.5 kg)  12/12/21 245 lb (111.1 kg)     GEN:  Well nourished, well developed in no acute distress HEENT: Normal NECK: No JVD; No carotid bruits CARDIAC: RRR, 1/6 systolic murmur RESPIRATORY:  CTAB, no wheezes ABDOMEN: Soft, non-tender, non-distended MUSCULOSKELETAL:  No edema; No deformity  SKIN: Warm and dry NEUROLOGIC:  Alert and oriented x 3 PSYCHIATRIC:  Normal affect   ASSESSMENT:    1. Coronary artery disease involving native coronary artery of native heart without angina pectoris   2. Hyperlipidemia LDL goal <70   3. Primary hypertension   4. Diabetes mellitus with coincident hypertension (HCC)   5. Morbid obesity with BMI of 50.0-59.9, adult (HCC)     PLAN:    In order of problems listed above:  #CAD: Coronary CTA with moderate disease in RCA and moderate disease in distal LAD with positive FFR in distal  LAD. Recommended for continued medical management.  -Continue crestor 20mg  daily -Continue zetia 10mg  daily -Conitnue ASA 81mg  daily -Continue fenofibtrate 145mg  daily -Continue metop 50mg  XL daily  #HTN: -Continue metop 50mg  XL and lisinopril 20mg  daily -BP well controlled on re-check today; she will monitor at home   #DMII: -Continue farxiga 5mg  daily -Declined ozempic or GLP-1 due to baseline GI issues -Follow-up with PCP as scheduled   #HLD: LDL 37 on most recent labs. -Continue fenofibtrate 145mg  daily -Continue crestor 20mg  daily -Continue zetia 10mg  daily  #Morbid Obesity: BMI 51. Continue to work on lifestyle modifications as discussed below  Exercise recommendations: Goal of exercising for at least 30 minutes a day, at least 5 times per week.  Please exercise to a moderate exertion.  This means that while exercising it is difficult to speak in full sentences, however you are not so short of breath that you feel you must stop, and not so comfortable that you can carry on a full conversation.  Exertion level should be approximately a 5/10, if 10 is the most exertion you can perform.  Diet recommendations: Recommend a heart healthy diet such as the Mediterranean diet.  This diet consists of plant based foods, healthy fats, lean meats, olive oil.  It suggests limiting the intake of simple carbohydrates such as white breads, pastries, and pastas.  It also limits the amount of red meat, wine, and dairy products such as cheese that one should consume on a daily  basis.           Medication Adjustments/Labs and Tests Ordered: Current medicines are reviewed at length with the patient today.  Concerns regarding medicines are outlined above.  Orders Placed This Encounter  Procedures   EKG 12-Lead   No orders of the defined types were placed in this encounter.   Patient Instructions  Medication Instructions:   Your physician recommends that you continue on your current  medications as directed. Please refer to the Current Medication list given to you today.  *If you need a refill on your cardiac medications before your next appointment, please call your pharmacy*    Follow-Up: At [facility], you and your health needs are our priority.  As part of our continuing mission to provide you with exceptional heart care, we have created designated Provider Care Teams.  These Care Teams include your primary Cardiologist (physician) and Advanced Practice Providers (APPs -  Physician Assistants and Nurse Practitioners) who all work together to provide you with the care you need, when you need it.  We recommend signing up for the patient portal called "MyChart".  Sign up information is provided on this After Visit Summary.  MyChart is used to connect with patients for Virtual Visits (Telemedicine).  Patients are able to view lab/test results, encounter notes, upcoming appointments, etc.  Non-urgent messages can be sent to your provider as well.   To learn more about what you can do with MyChart, go to [email or web address].    Your next appointment:   1 year(s)  Provider:   Jodelle Red, MD        Signed, Meriam Sprague, MD  09/20/2022 10:58 AM     [facility]

## 2022-09-20 ENCOUNTER — Encounter: Payer: Self-pay | Admitting: Cardiology

## 2022-09-20 ENCOUNTER — Ambulatory Visit: Payer: 59 | Attending: Cardiology | Admitting: Cardiology

## 2022-09-20 VITALS — BP 120/76 | HR 80 | Ht 59.0 in | Wt 255.8 lb

## 2022-09-20 DIAGNOSIS — I2510 Atherosclerotic heart disease of native coronary artery without angina pectoris: Secondary | ICD-10-CM | POA: Diagnosis not present

## 2022-09-20 DIAGNOSIS — E785 Hyperlipidemia, unspecified: Secondary | ICD-10-CM | POA: Diagnosis not present

## 2022-09-20 DIAGNOSIS — E119 Type 2 diabetes mellitus without complications: Secondary | ICD-10-CM

## 2022-09-20 DIAGNOSIS — I10 Essential (primary) hypertension: Secondary | ICD-10-CM | POA: Diagnosis not present

## 2022-09-20 DIAGNOSIS — Z6841 Body Mass Index (BMI) 40.0 and over, adult: Secondary | ICD-10-CM

## 2022-09-20 DIAGNOSIS — Z7984 Long term (current) use of oral hypoglycemic drugs: Secondary | ICD-10-CM

## 2022-09-20 NOTE — Patient Instructions (Signed)
Medication Instructions:  Your physician recommends that you continue on your current medications as directed. Please refer to the Current Medication list given to you today.  *If you need a refill on your cardiac medications before your next appointment, please call your pharmacy*  Follow-Up: At [facility], you and your health needs are our priority.  As part of our continuing mission to provide you with exceptional heart care, we have created designated Provider Care Teams.  These Care Teams include your primary Cardiologist (physician) and Advanced Practice Providers (APPs -  Physician Assistants and Nurse Practitioners) who all work together to provide you with the care you need, when you need it.  We recommend signing up for the patient portal called "MyChart".  Sign up information is provided on this After Visit Summary.  MyChart is used to connect with patients for Virtual Visits (Telemedicine).  Patients are able to view lab/test results, encounter notes, upcoming appointments, etc.  Non-urgent messages can be sent to your provider as well.   To learn more about what you can do with MyChart, go to [email or web address].    Your next appointment:   1 year(s)  Provider:   Bridgette Christopher, MD    

## 2023-01-07 ENCOUNTER — Other Ambulatory Visit: Payer: Self-pay

## 2023-01-07 MED ORDER — EZETIMIBE 10 MG PO TABS: 10.0000 mg | ORAL_TABLET | Freq: Every day | ORAL | 2 refills | Status: DC

## 2023-10-22 ENCOUNTER — Other Ambulatory Visit: Payer: Self-pay | Admitting: Nurse Practitioner

## 2023-11-25 ENCOUNTER — Other Ambulatory Visit: Payer: Self-pay | Admitting: Nurse Practitioner

## 2023-12-04 NOTE — Progress Notes (Unsigned)
 Cardiology Office Note    Date:  12/05/2023  ID:  Lauren Schmidt, DOB 17-Sep-1960, MRN [id number] PCP:  Rexanne Ingle, MD  Cardiologist:  Shelda Bruckner, MD  Electrophysiologist:  None   Chief Complaint: Follow up for CAD   History of Present Illness: .    Lauren Schmidt is a 63 y.o. female with visit-pertinent history of type 2 diabetes mellitus, hypertension, hyperlipidemia, GERD, obesity and history of bowel obstruction.  In 08/2021 patient was admitted for persistent chest pain for 3 weeks with mild troponin elevation 69.  TTE on 08/21/2021 with normal EF 6065%, no RWMA, no valve valve disease or evidence of pulmonary hypertension.  Cardiac CTA on 08/22/2021 with moderate RCA disease and only positive FFR in distal LAD, recommended for continued medical therapy.  Patient was last seen in clinic on 09/20/2022 by Dr. Hobart.  She had remained stable from a cardiac standpoint.  No medication changes were made at that time.   Today she presents for follow-up.  She reports that she has been doing very well overall.  She denies any chest pain, shortness of breath, lower extremity edema, orthopnea or PND.  She denies any palpitations, presyncope or syncope.  She reports that she regularly works in her yard at least 3 days a week.  She reports that earlier today she had worked in her yard for 4 hours, clearing out old brush.  Patient reports that she tolerated this very well, denied any anginal symptoms.  Labwork independently reviewed: 11/26/2023: Creatinine 0.8, sodium 138, potassium 4.7, AST 22, ALT 19 ROS: .   Today she denies chest pain, shortness of breath, lower extremity edema, fatigue, palpitations, melena, hematuria, hemoptysis, diaphoresis, weakness, presyncope, syncope, orthopnea, and PND.  All other systems are reviewed and otherwise negative. Studies Reviewed: SABRA   EKG:  EKG is ordered today, personally reviewed, demonstrating  EKG Interpretation Date/Time:  Friday December 05 2023 14:04:01 EDT Ventricular Rate:  81 PR Interval:  150 QRS Duration:  104 QT Interval:  382 QTC Calculation: 443 R Axis:   2  Text Interpretation: Normal sinus rhythm Inferior infarct , age undetermined Inferior Q waves noted present on prior EKG Moderate voltage criteria for LVH, may be normal variant Confirmed by Khalani Novoa [phone]) on 12/05/2023 2:44:34 PM   CV Studies: Cardiac studies reviewed are outlined and summarized above. Otherwise please see EMR for full report. Cardiac Studies & Procedures   ______________________________________________________________________________________________     ECHOCARDIOGRAM  ECHOCARDIOGRAM COMPLETE 08/21/2021  Narrative ECHOCARDIOGRAM REPORT    Patient Name:   Lauren Schmidt Date of Exam: 08/21/2021 Medical Rec #:  [id number]    Height:       60.0 in Accession #:    [id number]   Weight:       260.0 lb Date of Birth:  16-Aug-1960    BSA:          2.087 m Patient Age:    60 years     BP:           153/63 mmHg Patient Gender: F            HR:           70 bpm. Exam Location:  Inpatient  Procedure: 2D Echo, Color Doppler, Cardiac Doppler and Intracardiac Opacification Agent  Indications:    Chest pain  History:        Patient has no prior history of Echocardiogram examinations. Signs/Symptoms:Chest Pain; Risk Factors:Hypertension, Diabetes and HLD.  Sonographer:  Verla Snowman RCS Referring Phys: [id number] DENECE DELENA DUNCANS   Sonographer Comments: Image acquisition challenging due to patient body habitus. IMPRESSIONS   1. Left ventricular ejection fraction, by estimation, is 60 to 65%. The left ventricle has normal function. The left ventricle has no regional wall motion abnormalities. Left ventricular diastolic parameters are indeterminate. 2. Right ventricular systolic function is normal. The right ventricular size is normal. 3. Left atrial size was mildly dilated. 4. The mitral valve is normal in structure. No evidence of mitral  valve regurgitation. No evidence of mitral stenosis. 5. The aortic valve is normal in structure. Aortic valve regurgitation is not visualized. Aortic valve sclerosis is present, with no evidence of aortic valve stenosis. 6. The inferior vena cava is normal in size with greater than 50% respiratory variability, suggesting right atrial pressure of 3 mmHg.  FINDINGS Left Ventricle: Left ventricular ejection fraction, by estimation, is 60 to 65%. The left ventricle has normal function. The left ventricle has no regional wall motion abnormalities. Definity  contrast agent was given IV to delineate the left ventricular endocardial borders. The left ventricular internal cavity size was normal in size. There is no left ventricular hypertrophy. Left ventricular diastolic parameters are indeterminate.  Right Ventricle: The right ventricular size is normal. No increase in right ventricular wall thickness. Right ventricular systolic function is normal.  Left Atrium: Left atrial size was mildly dilated.  Right Atrium: Right atrial size was normal in size.  Pericardium: There is no evidence of pericardial effusion.  Mitral Valve: The mitral valve is normal in structure. No evidence of mitral valve regurgitation. No evidence of mitral valve stenosis.  Tricuspid Valve: The tricuspid valve is normal in structure. Tricuspid valve regurgitation is not demonstrated. No evidence of tricuspid stenosis.  Aortic Valve: The aortic valve is normal in structure. Aortic valve regurgitation is not visualized. Aortic valve sclerosis is present, with no evidence of aortic valve stenosis. Aortic valve mean gradient measures 5.0 mmHg. Aortic valve peak gradient measures 8.9 mmHg. Aortic valve area, by VTI measures 2.22 cm.  Pulmonic Valve: The pulmonic valve was normal in structure. Pulmonic valve regurgitation is not visualized. No evidence of pulmonic stenosis.  Aorta: The aortic root is normal in size and  structure.  Venous: The inferior vena cava is normal in size with greater than 50% respiratory variability, suggesting right atrial pressure of 3 mmHg.  IAS/Shunts: No atrial level shunt detected by color flow Doppler.   LEFT VENTRICLE PLAX 2D LVIDd:         4.10 cm      Diastology LVIDs:         3.00 cm      LV e' medial:    6.10 cm/s LV PW:         0.80 cm      LV E/e' medial:  15.5 LV IVS:        0.90 cm      LV e' lateral:   7.62 cm/s LVOT diam:     1.90 cm      LV E/e' lateral: 12.4 LV SV:         68 LV SV Index:   33 LVOT Area:     2.84 cm  LV Volumes (MOD) LV vol d, MOD A2C: 129.0 ml LV vol d, MOD A4C: 97.0 ml LV vol s, MOD A2C: 59.3 ml LV vol s, MOD A4C: 40.6 ml LV SV MOD A2C:     69.7 ml LV SV MOD A4C:  97.0 ml LV SV MOD BP:      60.5 ml  RIGHT VENTRICLE RV Basal diam:  3.40 cm RV S prime:     13.90 cm/s TAPSE (M-mode): 2.3 cm  LEFT ATRIUM             Index        RIGHT ATRIUM           Index LA diam:        3.90 cm 1.87 cm/m   RA Area:     11.50 cm LA Vol (A2C):   55.6 ml 26.64 ml/m  RA Volume:   21.30 ml  10.21 ml/m LA Vol (A4C):   74.2 ml 35.56 ml/m LA Biplane Vol: 68.5 ml 32.83 ml/m AORTIC VALVE                     PULMONIC VALVE AV Area (Vmax):    2.15 cm      PV Vmax:       0.91 m/s AV Area (Vmean):   2.16 cm      PV Peak grad:  3.3 mmHg AV Area (VTI):     2.22 cm AV Vmax:           149.00 cm/s AV Vmean:          107.000 cm/s AV VTI:            0.308 m AV Peak Grad:      8.9 mmHg AV Mean Grad:      5.0 mmHg LVOT Vmax:         113.00 cm/s LVOT Vmean:        81.600 cm/s LVOT VTI:          0.241 m LVOT/AV VTI ratio: 0.78  AORTA Ao Root diam: 3.30 cm Ao Asc diam:  3.00 cm  MITRAL VALVE MV Area (PHT): 4.89 cm    SHUNTS MV Decel Time: 155 msec    Systemic VTI:  0.24 m MV E velocity: 94.70 cm/s  Systemic Diam: 1.90 cm MV A velocity: 95.50 cm/s MV E/A ratio:  0.99 MV A Prime:    10.4 cm/s  Jerel Croitoru MD Electronically signed by  Jerel Balding MD Signature Date/Time: 08/21/2021/2:41:55 PM    Final      CT SCANS  CT CORONARY MORPH W/CTA COR W/SCORE 08/22/2021  Addendum 08/22/2021  2:35 PM ADDENDUM REPORT: 08/22/2021 14:32  HISTORY: Chest pain/anginal equiv, intermediate CAD risk, not treadmill candidate  EXAM: Cardiac/Coronary  CT  TECHNIQUE: The patient was scanned on a [employer].  PROTOCOL: A 120 kV prospective scan was triggered in the descending thoracic aorta at 111 HU's. Axial non-contrast 3 mm slices were carried out through the heart. The data set was analyzed on a dedicated work station and scored using the Agatston method. Gantry rotation speed was 250 msecs and collimation was .6 mm. Beta blockade and 0.8 mg of sl NTG was given. The 3D data set was reconstructed in 5% intervals of the 35-75 % of the R-R cycle. Systolic and diastolic phases were analyzed on a dedicated work station using MPR, MIP and VRT modes. The patient received 100mL OMNIPAQUE  IOHEXOL  350 MG/ML SOLN contrast.  FINDINGS: Image quality: Poor  Noise artifact is: Moderate, signal to noise ratio is reduced.  Coronary calcium  score is 107, which places the patient in the 87th percentile for age and sex matched control.  Coronary arteries: Normal coronary origins.  Right dominance.  Right Coronary Artery:  Mild mixed atherosclerotic plaque in the proximal RCA, 25-49% stenosis. Moderate atherosclerotic plaque in the mid and distal RCA, 50-69% stenosis.  Left Main Coronary Artery: No detectable plaque or stenosis.  Left Anterior Descending Coronary Artery: Minimal scattered mixed atherosclerotic plaque in the LAD, <25% stenosis. Moderate atherosclerotic plaque in the distal LAD, 50-69% stenosis. Image quality is suboptimal to definitely assess lumen, probable moderate stenosis in the proximal first diagonal artery, 50-69% stenosis.  Left Circumflex Artery: Small first OM with calcified plaque, may have  stenosis but unable to determine definitively. No other detectable plaque or stenosis in LCx.  Aorta: Normal size, 30 mm at the mid ascending aorta (level of the PA bifurcation) measured double oblique. No calcifications. No dissection.  Aortic Valve: No calcifications.  Other findings:  Normal pulmonary vein drainage into the left atrium.  Normal left atrial appendage without thrombus.  Normal size of the pulmonary artery.  IMPRESSION: 1. 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 CT FFR will be performed and reported separately.  2. Coronary calcium  score is 107, which places the patient in the 87th percentile for age and sex matched control.  3. Normal coronary origins with right dominance.   Electronically Signed By: Soyla Merck M.D. On: 08/22/2021 14:32  Narrative EXAM: OVER-READ INTERPRETATION  CT CHEST  The following report is a limited chest CT over-read performed by radiologist Dr. Rockey Kilts of [facility] on 08/22/2021. This over-read does not include interpretation of cardiac or coronary anatomy or pathology. The coronary CTA interpretation by the cardiologist is attached.  COMPARISON:  CTA chest of 1 day prior  FINDINGS: Vascular: Aortic atherosclerosis. No central pulmonary embolism, on this non-dedicated study.  Mediastinum/Nodes: No imaged thoracic adenopathy.  Lungs/Pleura: No pleural fluid. Tiny pulmonary nodules were detailed on yesterday's dedicated diagnostic chest CT. No superimposed acute lobar consolidation.  Upper Abdomen: Hepatic steatosis. Normal imaged portions of the spleen, stomach.  Musculoskeletal: No acute osseous abnormality. Lower thoracic spondylosis.  IMPRESSION: No acute findings in the imaged extracardiac chest.  Aortic Atherosclerosis (ICD10-I70.0).  Hepatic steatosis.  Electronically Signed: By: Rockey Kilts M.D. On: 08/22/2021  11:53   CT SCANS  CT CORONARY FRACTIONAL FLOW RESERVE DATA PREP 08/22/2021  Narrative EXAM: CT FFR ANALYSIS  CLINICAL DATA:  abnormal coronary CT  FINDINGS: FFRct analysis was performed on the original cardiac CT angiogram dataset. Diagrammatic representation of the FFRct analysis is provided in a separate PDF document in PACS. This dictation was created using the PDF document and an interactive 3D model of the results. 3D model is not available in the EMR/PACS. Normal FFR range is >0.80. Indeterminate (grey) zone is 0.76-0.80. FFR delta of 0.13 is considered significant.  1. Left Main: FFR = 0.98  2. LAD: Proximal FFR = 0.94, mid FFR = 0.90, Distal FFR = 0.56, first diagonal FFR = 0.81 3. LCX: Proximal FFR = 0.96, distal FFR = 0.93, OM1 FFR = 0.80 4. RCA: Proximal FFR = 0.96, mid FFR =0.93, Distal FFR = 0.81  IMPRESSION: 1. 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 The vessel is small caliber <85mm in this location. No other hemodynamically significant stenoses.  RECOMMENDATIONS: Guideline-directed medical therapy and aggressive risk factor modification for secondary prevention of coronary artery disease.   Electronically Signed By: Soyla Merck M.D. On: 08/22/2021 14:41     ______________________________________________________________________________________________       Current Reported Medications:.  Current Meds  Medication Sig   acetaminophen  (TYLENOL ) 500 MG tablet Take 2 tablets (1,000 mg total) by mouth every 6 (six) hours as needed.   ALPRAZolam  (XANAX ) 0.25 MG tablet Take 0.25 mg by mouth daily as needed for anxiety.   aspirin  EC 81 MG tablet Take 1 tablet (81 mg total) by mouth daily. Swallow whole.   buPROPion (WELLBUTRIN XL) 300 MG 24 hr tablet Take 300 mg by mouth in the morning.   Cholecalciferol (VITAMIN D-3) 125 MCG (5000 UT) TABS Take 5,000 Units by mouth in the morning.   dapagliflozin  propanediol  (FARXIGA ) 5 MG TABS tablet Take 5 mg by mouth at bedtime.   fenofibrate  (TRICOR ) 145 MG tablet Take 145 mg by mouth in the morning.   lisinopril  (PRINIVIL ,ZESTRIL ) 20 MG tablet Take 20 mg by mouth in the morning.   metFORMIN  (GLUCOPHAGE ) 500 MG tablet Take 1,000 mg by mouth 2 (two) times daily with a meal. Takes 2 tablets twice a day.    metoprolol  succinate (TOPROL -XL) 50 MG 24 hr tablet Take 50 mg by mouth in the morning. Take with or immediately following a meal.   Multiple Vitamin (MULTIVITAMIN WITH MINERALS) TABS tablet Take 1 tablet by mouth in the morning.   omeprazole (PRILOSEC) 20 MG capsule Take 20 mg by mouth daily before breakfast.   rosuvastatin  (CRESTOR ) 20 MG tablet Take 1 tablet (20 mg total) by mouth daily.   sertraline  (ZOLOFT ) 100 MG tablet Take 150 mg by mouth in the morning.   valACYclovir (VALTREX) 500 MG tablet Take 1 tablet by mouth daily.   [DISCONTINUED] ezetimibe  (ZETIA ) 10 MG tablet Take 1 tablet (10 mg total) by mouth daily. Pt needs to schedule appt with provider for further refills    Physical Exam:    VS:  BP 118/80   Pulse 81   Ht 5' (1.524 m)   Wt 234 lb (106.1 kg)   SpO2 98%   BMI 45.70 kg/m    Wt Readings from Last 3 Encounters:  12/05/23 234 lb (106.1 kg)  09/20/22 255 lb 12.8 oz (116 kg)  03/08/22 245 lb 12.8 oz (111.5 kg)    GEN: Well nourished, well developed in no acute distress NECK: No JVD; No carotid bruits CARDIAC: RRR, no murmurs, rubs, gallops RESPIRATORY:  Clear to auscultation without rales, wheezing or rhonchi  ABDOMEN: Soft, non-tender, non-distended EXTREMITIES:  No edema; No acute deformity     Asessement and Plan:.    CAD: Coronary CTA with moderate disease in RCA and moderate disease in distal LAD with positive FFR in distal LAD, recommended for continued medical management. Stable with no anginal symptoms. No indication for ischemic evaluation.  Heart healthy diet and regular cardiovascular exercise encouraged.  Reviewed ED  precautions. Continue aspirin  81 mg daily, Zetia  10 mg daily, Tricor  145 mg daily, lisinopril  20 mg daily, metoprolol  succinate 50 mg daily and Crestor  20 mg daily.  Hypertension: Blood pressure today 118/80.  Continue current antihypertensive regimen.  Type 2 diabetes mellitus: Last hemoglobin A1c 7.4% on 11/26/2023.  Monitor managed per PCP.  On metformin  and Farxiga .  Hyperlipidemia: Last lipid profile on 11/26/2023 indicated total cholesterol 85, HDL 14, glycerides 170 and LDL 42.  Continue Zetia  10 mg daily, Tricor  145 mg daily and Crestor  20 mg daily.   Disposition: F/u with Dr. Lonni in 6 months to establish care.   Signed, Alann Avey D Trenee Igoe, NP

## 2023-12-05 ENCOUNTER — Ambulatory Visit: Attending: Cardiology | Admitting: Cardiology

## 2023-12-05 ENCOUNTER — Encounter: Payer: Self-pay | Admitting: Cardiology

## 2023-12-05 VITALS — BP 118/80 | HR 81 | Ht 60.0 in | Wt 234.0 lb

## 2023-12-05 DIAGNOSIS — E785 Hyperlipidemia, unspecified: Secondary | ICD-10-CM

## 2023-12-05 DIAGNOSIS — E119 Type 2 diabetes mellitus without complications: Secondary | ICD-10-CM

## 2023-12-05 DIAGNOSIS — I10 Essential (primary) hypertension: Secondary | ICD-10-CM | POA: Diagnosis not present

## 2023-12-05 DIAGNOSIS — I2510 Atherosclerotic heart disease of native coronary artery without angina pectoris: Secondary | ICD-10-CM | POA: Diagnosis not present

## 2023-12-05 MED ORDER — EZETIMIBE 10 MG PO TABS: 10.0000 mg | ORAL_TABLET | Freq: Every day | ORAL | 3 refills | Status: AC

## 2023-12-05 NOTE — Patient Instructions (Signed)
 Medication Instructions:  No changes *If you need a refill on your cardiac medications before your next appointment, please call your pharmacy*  Lab Work: No labs If you have labs (blood work) drawn today and your tests are completely normal, you will receive your results only by: MyChart Message (if you have MyChart) OR A paper copy in the mail If you have any lab test that is abnormal or we need to change your treatment, we will call you to review the results.  Testing/Procedures: No testing  Follow-Up: At [facility], you and your health needs are our priority.  As part of our continuing mission to provide you with exceptional heart care, our providers are all part of one team.  This team includes your primary Cardiologist (physician) and Advanced Practice Providers or APPs (Physician Assistants and Nurse Practitioners) who all work together to provide you with the care you need, when you need it.  Your next appointment:   6 month(s)  Provider:   Shelda Bruckner, MD

## 2024-02-10 ENCOUNTER — Emergency Department (HOSPITAL_COMMUNITY)
Admission: EM | Admit: 2024-02-10 | Discharge: 2024-02-10 | Disposition: A | Discharge status: Home / Self Care | Attending: Emergency Medicine | Admitting: Emergency Medicine

## 2024-02-10 ENCOUNTER — Other Ambulatory Visit: Payer: Self-pay

## 2024-02-10 ENCOUNTER — Emergency Department (HOSPITAL_COMMUNITY)

## 2024-02-10 ENCOUNTER — Encounter (HOSPITAL_COMMUNITY): Payer: Self-pay

## 2024-02-10 DIAGNOSIS — Z7982 Long term (current) use of aspirin: Secondary | ICD-10-CM | POA: Insufficient documentation

## 2024-02-10 DIAGNOSIS — R1084 Generalized abdominal pain: Secondary | ICD-10-CM | POA: Insufficient documentation

## 2024-02-10 DIAGNOSIS — R110 Nausea: Secondary | ICD-10-CM | POA: Insufficient documentation

## 2024-02-10 LAB — URINALYSIS, ROUTINE W REFLEX MICROSCOPIC
Bilirubin Urine: NEGATIVE
Glucose, UA: >=500 mg/dL — AB
Hgb urine dipstick: NEGATIVE
Ketones, ur: NEGATIVE mg/dL
Leukocytes,Ua: NEGATIVE
Nitrite: NEGATIVE
Protein, ur: NEGATIVE mg/dL
Specific Gravity, Urine: 1.032 — ABNORMAL HIGH (ref 1.005–1.030)
pH: 5 (ref 5.0–8.0)

## 2024-02-10 LAB — COMPREHENSIVE METABOLIC PANEL WITH GFR
ALT: 17 U/L (ref 0–44)
AST: 25 U/L (ref 15–41)
Albumin: 4.3 g/dL (ref 3.5–5.0)
Alkaline Phosphatase: 42 U/L (ref 38–126)
Anion gap: 12 (ref 5–15)
BUN: 18 mg/dL (ref 8–23)
CO2: 24 mmol/L (ref 22–32)
Calcium: 9.4 mg/dL (ref 8.9–10.3)
Chloride: 102 mmol/L (ref 98–111)
Creatinine, Ser: 0.76 mg/dL (ref 0.44–1.00)
GFR, Estimated: >60 mL/min (ref >60)
Glucose, Bld: 170 mg/dL — ABNORMAL HIGH (ref 70–99)
Potassium: 4.1 mmol/L (ref 3.5–5.1)
Sodium: 139 mmol/L (ref 135–145)
Total Bilirubin: 0.4 mg/dL (ref 0.0–1.2)
Total Protein: 7.6 g/dL (ref 6.5–8.1)

## 2024-02-10 LAB — CBC WITH DIFFERENTIAL/PLATELET
Abs Immature Granulocytes: 0.01 K/uL (ref 0.00–0.07)
Basophils Absolute: 0 K/uL (ref 0.0–0.1)
Basophils Relative: 1 %
Eosinophils Absolute: 0.2 K/uL (ref 0.0–0.5)
Eosinophils Relative: 5 %
HCT: 38.6 % (ref 36.0–46.0)
Hemoglobin: 11.2 g/dL — ABNORMAL LOW (ref 12.0–15.0)
Immature Granulocytes: 0 %
Lymphocytes Relative: 30 %
Lymphs Abs: 1.6 K/uL (ref 0.7–4.0)
MCH: 26.9 pg (ref 26.0–34.0)
MCHC: 29 g/dL — ABNORMAL LOW (ref 30.0–36.0)
MCV: 92.8 fL (ref 80.0–100.0)
Monocytes Absolute: 0.4 K/uL (ref 0.1–1.0)
Monocytes Relative: 7 %
Neutro Abs: 3 K/uL (ref 1.7–7.7)
Neutrophils Relative %: 57 %
Platelets: 328 K/uL (ref 150–400)
RBC: 4.16 MIL/uL (ref 3.87–5.11)
RDW: 13.6 % (ref 11.5–15.5)
WBC: 5.3 K/uL (ref 4.0–10.5)
nRBC: 0 % (ref 0.0–0.2)

## 2024-02-10 LAB — I-STAT CG4 LACTIC ACID, ED: Lactic Acid, Venous: 0.9 mmol/L (ref 0.5–1.9)

## 2024-02-10 LAB — LIPASE, BLOOD: Lipase: 26 U/L (ref 11–51)

## 2024-02-10 MED ORDER — DICYCLOMINE HCL 10 MG PO CAPS
20.0000 mg | ORAL_CAPSULE | Freq: Once | ORAL | Status: AC
  Administered 2024-02-10: 20 mg via ORAL
  Filled 2024-02-10: qty 2

## 2024-02-10 MED ORDER — MORPHINE SULFATE (PF) 4 MG/ML IV SOLN
4.0000 mg | Freq: Once | INTRAVENOUS | Status: AC
  Administered 2024-02-10: 4 mg via INTRAVENOUS
  Filled 2024-02-10: qty 1

## 2024-02-10 MED ORDER — DICYCLOMINE HCL 20 MG PO TABS: 20.0000 mg | ORAL_TABLET | Freq: Two times a day (BID) | ORAL | 0 refills | Status: AC

## 2024-02-10 MED ORDER — LACTATED RINGERS IV BOLUS
1000.0000 mL | Freq: Once | INTRAVENOUS | Status: AC
  Administered 2024-02-10: 1000 mL via INTRAVENOUS

## 2024-02-10 MED ORDER — IOHEXOL 300 MG/ML  SOLN
100.0000 mL | Freq: Once | INTRAMUSCULAR | Status: AC | PRN
  Administered 2024-02-10: 100 mL via INTRAVENOUS

## 2024-02-10 MED ORDER — FAMOTIDINE IN NACL 20-0.9 MG/50ML-% IV SOLN
20.0000 mg | Freq: Once | INTRAVENOUS | Status: AC
  Administered 2024-02-10: 20 mg via INTRAVENOUS
  Filled 2024-02-10: qty 50

## 2024-02-10 MED ORDER — LACTATED RINGERS IV BOLUS
1000.0000 mL | Freq: Once | INTRAVENOUS | Status: AC
Start: 2024-02-10 — End: 2024-02-10
  Administered 2024-02-10: 1000 mL via INTRAVENOUS

## 2024-02-10 MED ORDER — FAMOTIDINE 20 MG PO TABS: 20.0000 mg | ORAL_TABLET | Freq: Two times a day (BID) | ORAL | 0 refills | Status: AC

## 2024-02-10 MED ORDER — ONDANSETRON HCL 4 MG/2ML IJ SOLN
4.0000 mg | Freq: Once | INTRAMUSCULAR | Status: AC
  Administered 2024-02-10: 4 mg via INTRAVENOUS
  Filled 2024-02-10: qty 2

## 2024-02-10 NOTE — ED Provider Notes (Signed)
 [place] EMERGENCY DEPARTMENT AT [facility] Provider Note   CSN: [id number] Arrival date & time: 02/10/24  9252     Patient presents with: Abdominal Pain   Lauren Schmidt is a 62 y.o. female.   Patient is here for evaluation of abdominal pain that began on Thursday.  She is tearful on exam today.  She reports decreased stool since that time.  She is having 3-4 bowel movements daily with minimal stool that is normal consistency but she states it is oddly shaped and describes that it is L-shaped.  She denies diarrhea, blood in stool, or melena.  She does report nausea when she attempted to drink her morning coffee this morning.  She denies any vomiting or fevers.  She denies any recent abnormal food intake.  She does report previous blockages that feel similarly to how she feels today.  She has not tried any over-the-counter medications.  She denies any stool softeners or enema use.  She does take a daily probiotic.  She has a history of abdominal surgeries; these include gallbladder removal as well as surgery secondary to a previous blockage.  She does not have a colostomy.   Abdominal Pain Pain location:  Generalized Pain quality: aching, bloating, fullness and pressure   Pain radiates to:  Does not radiate Pain severity:  Severe Onset quality:  Gradual Duration:  5 days Timing:  Constant Progression:  Worsening Chronicity:  New Context: previous surgery   Context: not laxative use, not retching, not sick contacts and not suspicious food intake   Relieved by:  Nothing Worsened by:  Eating and position changes Ineffective treatments:  None tried Associated symptoms: constipation and nausea   Associated symptoms: no diarrhea, no dysuria, no fever, no hematemesis, no hematochezia, no hematuria, no melena and no vomiting   Risk factors: multiple surgeries and obesity   Risk factors: no recent hospitalization        Prior to Admission medications   Medication Sig Start  Date End Date Taking? Authorizing Provider  acetaminophen  (TYLENOL ) 500 MG tablet Take 2 tablets (1,000 mg total) by mouth every 6 (six) hours as needed. 08/28/21   Tammy Sor, PA-C  ALPRAZolam  (XANAX ) 0.25 MG tablet Take 0.25 mg by mouth daily as needed for anxiety.    [provider]  aspirin  EC 81 MG tablet Take 1 tablet (81 mg total) by mouth daily. Swallow whole. 08/29/21   Regalado, Belkys A, MD  buPROPion (WELLBUTRIN XL) 300 MG 24 hr tablet Take 300 mg by mouth in the morning.    [provider]  Cholecalciferol (VITAMIN D-3) 125 MCG (5000 UT) TABS Take 5,000 Units by mouth in the morning.    [provider]  dapagliflozin  propanediol (FARXIGA ) 5 MG TABS tablet Take 5 mg by mouth at bedtime.    [provider]  ezetimibe  (ZETIA ) 10 MG tablet Take 1 tablet (10 mg total) by mouth daily. Pt needs to schedule appt with provider for further refills 12/05/23   West, Katlyn D, NP  fenofibrate  (TRICOR ) 145 MG tablet Take 145 mg by mouth in the morning.    [provider]  lisinopril  (PRINIVIL ,ZESTRIL ) 20 MG tablet Take 20 mg by mouth in the morning.    [provider]  metFORMIN  (GLUCOPHAGE ) 500 MG tablet Take 1,000 mg by mouth 2 (two) times daily with a meal. Takes 2 tablets twice a day.     [provider]  metoprolol  succinate (TOPROL -XL) 50 MG 24 hr tablet Take 50  mg by mouth in the morning. Take with or immediately following a meal.    [provider]  Multiple Vitamin (MULTIVITAMIN WITH MINERALS) TABS tablet Take 1 tablet by mouth in the morning.    [provider]  omeprazole (PRILOSEC) 20 MG capsule Take 20 mg by mouth daily before breakfast.    [provider]  rosuvastatin  (CRESTOR ) 20 MG tablet Take 1 tablet (20 mg total) by mouth daily. 08/29/21   Regalado, Belkys A, MD  sertraline  (ZOLOFT ) 100 MG tablet Take 150 mg by mouth in the morning. 11/22/12   [provider]  valACYclovir (VALTREX)  500 MG tablet Take 1 tablet by mouth daily.    [provider]    Allergies: Nitrofurantoin macrocrystal, Tape, and Shellfish allergy    Review of Systems  Constitutional:  Negative for fever.  Gastrointestinal:  Positive for abdominal pain, constipation and nausea. Negative for diarrhea, hematemesis, hematochezia, melena and vomiting.  Genitourinary:  Negative for dysuria and hematuria.    Updated Vital Signs BP (!) 144/79 (BP Location: Left Arm)   Pulse 82   Temp 98 F (36.7 C) (Oral)   Resp 16   Ht 5' (1.524 m)   Wt 108.9 kg   SpO2 100%   BMI 46.87 kg/m   Physical Exam Vitals and nursing note reviewed.  Constitutional:      Appearance: She is well-developed. She is obese. She is not ill-appearing.     Comments: Tearful on exam.  HENT:     Head: Normocephalic and atraumatic.  Eyes:     General: No scleral icterus.    Extraocular Movements: Extraocular movements intact.  Cardiovascular:     Rate and Rhythm: Normal rate and regular rhythm.     Heart sounds: Normal heart sounds.  Pulmonary:     Effort: Pulmonary effort is normal. No respiratory distress.     Breath sounds: Normal breath sounds.  Abdominal:     General: Bowel sounds are decreased. There is distension.     Palpations: Abdomen is soft. There is no fluid wave, hepatomegaly or mass.     Tenderness: There is generalized abdominal tenderness. There is no right CVA tenderness, left CVA tenderness or guarding.     Hernia: No hernia is present.  Skin:    General: Skin is warm and dry.     Coloration: Skin is not jaundiced or pale.  Neurological:     Mental Status: She is alert and oriented to person, place, and time.     (all labs ordered are listed, but only abnormal results are displayed) Labs Reviewed - No data to display  EKG: None  Radiology: No results found.   Procedures   Medications Ordered in the ED - No data to display    Patient presents to the ED for concern of diffuse  abdominal pain, this involves an extensive number of treatment options, and is a complaint that carries with it a high risk of complications and morbidity.  The differential diagnosis includes bowel obstruction or stricture, peptic ulcer disease, AAA, cancer, bowel perforation, pancreatitis, hepatitis, gastritis, diverticulitis, spasmodic bowel, or underlying infectious process.   Co morbidities that complicate the patient evaluation  Previous bowel obstruction with surgical intervention.  Gallbladder removal.   Additional history obtained:  Additional history obtained from past surgical records     External records from outside source obtained and reviewed including past surgical records and treatment course.   Lab Tests:  I Ordered, and personally interpreted labs.  The pertinent results include: Unremarkable blood work including, normal lipase, lactic acid, and LFTs.   Imaging Studies ordered:  I ordered imaging studies including CT abdomen pelvis. I independently visualized and interpreted imaging which showed no obvious abdominal strictures or blockages.  I agree with the radiologist interpretation.   Medicines ordered and prescription drug management:  I ordered medication including Pepcid , Bentyl, morphine , Zofran , 2 L lactated Ringer 's, omanipaque for abdominal pain and nausea. Reevaluation of the patient after these medicines showed that the patient improved I have reviewed the patients home medicines and have made adjustments as needed   Problem List / ED Course:  Patient is here for evaluation of generalized abdominal pain.  Patient has history of previous bowel obstruction with surgical repair and gallbladder removal.  Lab work and imaging are reassuring with no obvious signs of obstruction.  Patient had minimal improvement of symptoms with morphine .  She had remarkable improvement with Bentyl leading us  to believe that her pain was spasmodic bowel in  origin.   Reevaluation:  After the interventions noted above, I reevaluated the patient and found that they have :improved   Dispostion:  After consideration of the diagnostic results and the patients response to treatment, I feel that the patent would benefit from supportive care at home with prescribed Bentyl and Pepcid . Imaging did show increased stool burden so she was recommended to begin MiraLAX daily.  Patient also recommended to follow-up with her primary care physician to discuss accidental finding of hepatic steatosis and liver cyst.   Clinical Course as of 02/10/24 1228  Tue Feb 10, 2024  1008 CT ABDOMEN PELVIS W CONTRAST [EA]    Clinical Course User Index [EA] Rosina Almarie LABOR, PA-C                                 Medical Decision Making       Final diagnoses:  None    ED Discharge Orders     None          Rosina Almarie LABOR DEVONNA 02/10/24 1434    Randol Simmonds, MD 02/11/24 [phone]

## 2024-02-10 NOTE — ED Notes (Addendum)
 Patient was asked for a urine sample patient responded I can't right now. Patient was made aware of the need for a urine sample.

## 2024-02-10 NOTE — ED Triage Notes (Signed)
 Pt to er, pt states that she is here for lower abd pain, states that she has had the pain since Thursday.  States that she is not having normal bm, states that they are smaller and shaped like a 7 or an L  states that even after she has a bm she still feels like she needs to have a bm.

## 2024-02-10 NOTE — Discharge Instructions (Addendum)
 It was a pleasure meeting with you today.  Take prescribed medications as directed.  Begin taking MiraLAX daily and increase dosage until you are having regular bowel movements.  Follow-up with primary care to further discuss today's imaging results and potential for additional imaging in a nonemergent setting.  Please return to our facility if abdominal pain worsens or you experience any other concerning symptoms including, yellowing of the skin or eyes, blood in stool, black tarry stool, or persistent vomiting.

## 2024-07-22 ENCOUNTER — Ambulatory Visit (HOSPITAL_BASED_OUTPATIENT_CLINIC_OR_DEPARTMENT_OTHER): Admitting: Cardiology
